# Patient Record
Sex: Female | Born: 1935 | Race: White | Hispanic: No | Smoking: Former smoker
Health system: Southern US, Community
[De-identification: ages and names within clinical notes are randomized; demographics above are authoritative.]

## PROBLEM LIST (undated history)

## (undated) DIAGNOSIS — I5022 Chronic systolic (congestive) heart failure: Secondary | ICD-10-CM

## (undated) DIAGNOSIS — I2510 Atherosclerotic heart disease of native coronary artery without angina pectoris: Secondary | ICD-10-CM

## (undated) DIAGNOSIS — I739 Peripheral vascular disease, unspecified: Secondary | ICD-10-CM

## (undated) DIAGNOSIS — C50919 Malignant neoplasm of unspecified site of unspecified female breast: Secondary | ICD-10-CM

## (undated) DIAGNOSIS — R0600 Dyspnea, unspecified: Secondary | ICD-10-CM

## (undated) DIAGNOSIS — R7989 Other specified abnormal findings of blood chemistry: Secondary | ICD-10-CM

## (undated) DIAGNOSIS — E785 Hyperlipidemia, unspecified: Secondary | ICD-10-CM

## (undated) DIAGNOSIS — M810 Age-related osteoporosis without current pathological fracture: Secondary | ICD-10-CM

## (undated) DIAGNOSIS — I10 Essential (primary) hypertension: Secondary | ICD-10-CM

## (undated) DIAGNOSIS — R778 Other specified abnormalities of plasma proteins: Secondary | ICD-10-CM

## (undated) DIAGNOSIS — C50411 Malignant neoplasm of upper-outer quadrant of right female breast: Secondary | ICD-10-CM

## (undated) HISTORY — DX: Other specified abnormalities of plasma proteins: R77.8

## (undated) HISTORY — DX: Other specified abnormal findings of blood chemistry: R79.89

## (undated) HISTORY — DX: Dyspnea, unspecified: R06.00

## (undated) HISTORY — DX: Age-related osteoporosis without current pathological fracture: M81.0

## (undated) HISTORY — PX: BREAST LUMPECTOMY: SHX2

## (undated) HISTORY — DX: Malignant neoplasm of upper-outer quadrant of right female breast: C50.411

---

## 2006-02-02 DIAGNOSIS — Z951 Presence of aortocoronary bypass graft: Secondary | ICD-10-CM

## 2006-02-02 DIAGNOSIS — Z9889 Other specified postprocedural states: Secondary | ICD-10-CM

## 2006-02-02 HISTORY — DX: Presence of aortocoronary bypass graft: Z95.1

## 2006-02-02 HISTORY — DX: Other specified postprocedural states: Z98.890

## 2006-09-17 ENCOUNTER — Ambulatory Visit: Payer: Self-pay | Admitting: Cardiothoracic Surgery

## 2011-03-02 DIAGNOSIS — J019 Acute sinusitis, unspecified: Secondary | ICD-10-CM | POA: Diagnosis not present

## 2011-03-02 DIAGNOSIS — J209 Acute bronchitis, unspecified: Secondary | ICD-10-CM | POA: Diagnosis not present

## 2011-03-02 DIAGNOSIS — Z6826 Body mass index (BMI) 26.0-26.9, adult: Secondary | ICD-10-CM | POA: Diagnosis not present

## 2011-03-16 DIAGNOSIS — H353190 Nonexudative age-related macular degeneration, unspecified eye, stage unspecified: Secondary | ICD-10-CM | POA: Diagnosis not present

## 2011-03-16 DIAGNOSIS — H524 Presbyopia: Secondary | ICD-10-CM | POA: Diagnosis not present

## 2011-03-16 DIAGNOSIS — H26499 Other secondary cataract, unspecified eye: Secondary | ICD-10-CM | POA: Diagnosis not present

## 2011-05-27 DIAGNOSIS — E785 Hyperlipidemia, unspecified: Secondary | ICD-10-CM | POA: Diagnosis not present

## 2011-05-27 DIAGNOSIS — Z79899 Other long term (current) drug therapy: Secondary | ICD-10-CM | POA: Diagnosis not present

## 2011-05-27 DIAGNOSIS — Z6826 Body mass index (BMI) 26.0-26.9, adult: Secondary | ICD-10-CM | POA: Diagnosis not present

## 2011-05-27 DIAGNOSIS — K625 Hemorrhage of anus and rectum: Secondary | ICD-10-CM | POA: Diagnosis not present

## 2011-05-27 DIAGNOSIS — I4891 Unspecified atrial fibrillation: Secondary | ICD-10-CM | POA: Diagnosis not present

## 2011-05-27 DIAGNOSIS — I10 Essential (primary) hypertension: Secondary | ICD-10-CM | POA: Diagnosis not present

## 2011-05-27 DIAGNOSIS — R1013 Epigastric pain: Secondary | ICD-10-CM | POA: Diagnosis not present

## 2011-06-16 DIAGNOSIS — I2510 Atherosclerotic heart disease of native coronary artery without angina pectoris: Secondary | ICD-10-CM | POA: Diagnosis not present

## 2011-06-16 DIAGNOSIS — Z79899 Other long term (current) drug therapy: Secondary | ICD-10-CM | POA: Diagnosis not present

## 2011-06-16 DIAGNOSIS — I10 Essential (primary) hypertension: Secondary | ICD-10-CM | POA: Diagnosis not present

## 2011-11-23 DIAGNOSIS — E785 Hyperlipidemia, unspecified: Secondary | ICD-10-CM | POA: Diagnosis not present

## 2011-11-23 DIAGNOSIS — I10 Essential (primary) hypertension: Secondary | ICD-10-CM | POA: Diagnosis not present

## 2011-11-23 DIAGNOSIS — Z79899 Other long term (current) drug therapy: Secondary | ICD-10-CM | POA: Diagnosis not present

## 2011-11-23 DIAGNOSIS — Z23 Encounter for immunization: Secondary | ICD-10-CM | POA: Diagnosis not present

## 2011-11-23 DIAGNOSIS — K279 Peptic ulcer, site unspecified, unspecified as acute or chronic, without hemorrhage or perforation: Secondary | ICD-10-CM | POA: Diagnosis not present

## 2011-11-23 DIAGNOSIS — I4891 Unspecified atrial fibrillation: Secondary | ICD-10-CM | POA: Diagnosis not present

## 2011-12-23 DIAGNOSIS — E039 Hypothyroidism, unspecified: Secondary | ICD-10-CM | POA: Diagnosis not present

## 2012-05-24 DIAGNOSIS — Z1331 Encounter for screening for depression: Secondary | ICD-10-CM | POA: Diagnosis not present

## 2012-05-24 DIAGNOSIS — Z79899 Other long term (current) drug therapy: Secondary | ICD-10-CM | POA: Diagnosis not present

## 2012-05-24 DIAGNOSIS — I10 Essential (primary) hypertension: Secondary | ICD-10-CM | POA: Diagnosis not present

## 2012-05-24 DIAGNOSIS — Z9181 History of falling: Secondary | ICD-10-CM | POA: Diagnosis not present

## 2012-05-24 DIAGNOSIS — I4891 Unspecified atrial fibrillation: Secondary | ICD-10-CM | POA: Diagnosis not present

## 2012-05-24 DIAGNOSIS — Z6825 Body mass index (BMI) 25.0-25.9, adult: Secondary | ICD-10-CM | POA: Diagnosis not present

## 2012-05-24 DIAGNOSIS — E039 Hypothyroidism, unspecified: Secondary | ICD-10-CM | POA: Diagnosis not present

## 2012-05-24 DIAGNOSIS — E785 Hyperlipidemia, unspecified: Secondary | ICD-10-CM | POA: Diagnosis not present

## 2012-09-05 DIAGNOSIS — J209 Acute bronchitis, unspecified: Secondary | ICD-10-CM | POA: Diagnosis not present

## 2012-09-05 DIAGNOSIS — Z6826 Body mass index (BMI) 26.0-26.9, adult: Secondary | ICD-10-CM | POA: Diagnosis not present

## 2012-09-12 DIAGNOSIS — J440 Chronic obstructive pulmonary disease with acute lower respiratory infection: Secondary | ICD-10-CM | POA: Diagnosis not present

## 2012-09-12 DIAGNOSIS — Z6826 Body mass index (BMI) 26.0-26.9, adult: Secondary | ICD-10-CM | POA: Diagnosis not present

## 2012-09-21 DIAGNOSIS — J209 Acute bronchitis, unspecified: Secondary | ICD-10-CM | POA: Diagnosis not present

## 2012-09-21 DIAGNOSIS — R0609 Other forms of dyspnea: Secondary | ICD-10-CM | POA: Diagnosis not present

## 2012-11-29 DIAGNOSIS — I10 Essential (primary) hypertension: Secondary | ICD-10-CM | POA: Diagnosis not present

## 2012-11-29 DIAGNOSIS — E785 Hyperlipidemia, unspecified: Secondary | ICD-10-CM | POA: Diagnosis not present

## 2012-11-29 DIAGNOSIS — E039 Hypothyroidism, unspecified: Secondary | ICD-10-CM | POA: Diagnosis not present

## 2012-11-29 DIAGNOSIS — I4891 Unspecified atrial fibrillation: Secondary | ICD-10-CM | POA: Diagnosis not present

## 2012-11-29 DIAGNOSIS — Z79899 Other long term (current) drug therapy: Secondary | ICD-10-CM | POA: Diagnosis not present

## 2012-11-29 DIAGNOSIS — Z6826 Body mass index (BMI) 26.0-26.9, adult: Secondary | ICD-10-CM | POA: Diagnosis not present

## 2013-03-07 DIAGNOSIS — H353190 Nonexudative age-related macular degeneration, unspecified eye, stage unspecified: Secondary | ICD-10-CM | POA: Diagnosis not present

## 2013-03-07 DIAGNOSIS — Z961 Presence of intraocular lens: Secondary | ICD-10-CM | POA: Diagnosis not present

## 2013-03-07 DIAGNOSIS — E11319 Type 2 diabetes mellitus with unspecified diabetic retinopathy without macular edema: Secondary | ICD-10-CM | POA: Diagnosis not present

## 2013-03-07 DIAGNOSIS — H35379 Puckering of macula, unspecified eye: Secondary | ICD-10-CM | POA: Diagnosis not present

## 2013-05-02 DIAGNOSIS — I10 Essential (primary) hypertension: Secondary | ICD-10-CM | POA: Diagnosis not present

## 2013-05-02 DIAGNOSIS — I2510 Atherosclerotic heart disease of native coronary artery without angina pectoris: Secondary | ICD-10-CM | POA: Diagnosis not present

## 2013-05-02 DIAGNOSIS — I059 Rheumatic mitral valve disease, unspecified: Secondary | ICD-10-CM | POA: Diagnosis not present

## 2013-05-16 DIAGNOSIS — R079 Chest pain, unspecified: Secondary | ICD-10-CM | POA: Diagnosis not present

## 2013-05-16 DIAGNOSIS — I059 Rheumatic mitral valve disease, unspecified: Secondary | ICD-10-CM | POA: Diagnosis not present

## 2013-05-16 DIAGNOSIS — I2510 Atherosclerotic heart disease of native coronary artery without angina pectoris: Secondary | ICD-10-CM | POA: Diagnosis not present

## 2013-05-19 DIAGNOSIS — Z6826 Body mass index (BMI) 26.0-26.9, adult: Secondary | ICD-10-CM | POA: Diagnosis not present

## 2013-05-19 DIAGNOSIS — J440 Chronic obstructive pulmonary disease with acute lower respiratory infection: Secondary | ICD-10-CM | POA: Diagnosis not present

## 2013-05-24 DIAGNOSIS — I10 Essential (primary) hypertension: Secondary | ICD-10-CM | POA: Diagnosis not present

## 2013-05-24 DIAGNOSIS — I428 Other cardiomyopathies: Secondary | ICD-10-CM | POA: Diagnosis not present

## 2013-05-24 DIAGNOSIS — I2510 Atherosclerotic heart disease of native coronary artery without angina pectoris: Secondary | ICD-10-CM | POA: Diagnosis not present

## 2013-05-24 DIAGNOSIS — Z79899 Other long term (current) drug therapy: Secondary | ICD-10-CM | POA: Diagnosis not present

## 2013-06-06 DIAGNOSIS — Z6825 Body mass index (BMI) 25.0-25.9, adult: Secondary | ICD-10-CM | POA: Diagnosis not present

## 2013-06-06 DIAGNOSIS — I10 Essential (primary) hypertension: Secondary | ICD-10-CM | POA: Diagnosis not present

## 2013-06-06 DIAGNOSIS — Z79899 Other long term (current) drug therapy: Secondary | ICD-10-CM | POA: Diagnosis not present

## 2013-06-06 DIAGNOSIS — I4891 Unspecified atrial fibrillation: Secondary | ICD-10-CM | POA: Diagnosis not present

## 2013-06-06 DIAGNOSIS — E039 Hypothyroidism, unspecified: Secondary | ICD-10-CM | POA: Diagnosis not present

## 2013-06-06 DIAGNOSIS — E785 Hyperlipidemia, unspecified: Secondary | ICD-10-CM | POA: Diagnosis not present

## 2013-12-12 DIAGNOSIS — Z9181 History of falling: Secondary | ICD-10-CM | POA: Diagnosis not present

## 2013-12-12 DIAGNOSIS — E039 Hypothyroidism, unspecified: Secondary | ICD-10-CM | POA: Diagnosis not present

## 2013-12-12 DIAGNOSIS — Z1389 Encounter for screening for other disorder: Secondary | ICD-10-CM | POA: Diagnosis not present

## 2013-12-12 DIAGNOSIS — Z23 Encounter for immunization: Secondary | ICD-10-CM | POA: Diagnosis not present

## 2013-12-12 DIAGNOSIS — E785 Hyperlipidemia, unspecified: Secondary | ICD-10-CM | POA: Diagnosis not present

## 2013-12-12 DIAGNOSIS — I10 Essential (primary) hypertension: Secondary | ICD-10-CM | POA: Diagnosis not present

## 2013-12-12 DIAGNOSIS — Z79899 Other long term (current) drug therapy: Secondary | ICD-10-CM | POA: Diagnosis not present

## 2013-12-12 DIAGNOSIS — I4891 Unspecified atrial fibrillation: Secondary | ICD-10-CM | POA: Diagnosis not present

## 2014-01-02 DIAGNOSIS — Z23 Encounter for immunization: Secondary | ICD-10-CM | POA: Diagnosis not present

## 2014-01-09 DIAGNOSIS — Z6824 Body mass index (BMI) 24.0-24.9, adult: Secondary | ICD-10-CM | POA: Diagnosis not present

## 2014-01-09 DIAGNOSIS — J209 Acute bronchitis, unspecified: Secondary | ICD-10-CM | POA: Diagnosis not present

## 2014-01-09 DIAGNOSIS — J019 Acute sinusitis, unspecified: Secondary | ICD-10-CM | POA: Diagnosis not present

## 2014-07-03 DIAGNOSIS — I10 Essential (primary) hypertension: Secondary | ICD-10-CM | POA: Diagnosis not present

## 2014-07-03 DIAGNOSIS — Z79899 Other long term (current) drug therapy: Secondary | ICD-10-CM | POA: Diagnosis not present

## 2014-07-03 DIAGNOSIS — E785 Hyperlipidemia, unspecified: Secondary | ICD-10-CM | POA: Diagnosis not present

## 2014-07-03 DIAGNOSIS — I4891 Unspecified atrial fibrillation: Secondary | ICD-10-CM | POA: Diagnosis not present

## 2014-07-03 DIAGNOSIS — Z1389 Encounter for screening for other disorder: Secondary | ICD-10-CM | POA: Diagnosis not present

## 2014-07-03 DIAGNOSIS — Z6824 Body mass index (BMI) 24.0-24.9, adult: Secondary | ICD-10-CM | POA: Diagnosis not present

## 2014-07-03 DIAGNOSIS — E039 Hypothyroidism, unspecified: Secondary | ICD-10-CM | POA: Diagnosis not present

## 2014-10-02 DIAGNOSIS — Z6825 Body mass index (BMI) 25.0-25.9, adult: Secondary | ICD-10-CM | POA: Diagnosis not present

## 2014-10-02 DIAGNOSIS — Z9889 Other specified postprocedural states: Secondary | ICD-10-CM | POA: Diagnosis not present

## 2014-10-02 DIAGNOSIS — I429 Cardiomyopathy, unspecified: Secondary | ICD-10-CM | POA: Diagnosis not present

## 2014-10-02 DIAGNOSIS — I2510 Atherosclerotic heart disease of native coronary artery without angina pectoris: Secondary | ICD-10-CM

## 2014-10-02 DIAGNOSIS — I10 Essential (primary) hypertension: Secondary | ICD-10-CM | POA: Diagnosis not present

## 2014-10-02 HISTORY — DX: Essential (primary) hypertension: I10

## 2014-10-02 HISTORY — DX: Atherosclerotic heart disease of native coronary artery without angina pectoris: I25.10

## 2014-10-02 HISTORY — DX: Other specified postprocedural states: Z98.890

## 2014-10-24 DIAGNOSIS — I340 Nonrheumatic mitral (valve) insufficiency: Secondary | ICD-10-CM | POA: Diagnosis not present

## 2014-10-24 DIAGNOSIS — I517 Cardiomegaly: Secondary | ICD-10-CM | POA: Diagnosis not present

## 2014-11-28 DIAGNOSIS — M545 Low back pain: Secondary | ICD-10-CM | POA: Diagnosis not present

## 2014-11-28 DIAGNOSIS — I739 Peripheral vascular disease, unspecified: Secondary | ICD-10-CM | POA: Diagnosis not present

## 2014-11-28 DIAGNOSIS — I70213 Atherosclerosis of native arteries of extremities with intermittent claudication, bilateral legs: Secondary | ICD-10-CM | POA: Diagnosis not present

## 2014-12-06 DIAGNOSIS — I70213 Atherosclerosis of native arteries of extremities with intermittent claudication, bilateral legs: Secondary | ICD-10-CM | POA: Diagnosis not present

## 2014-12-06 DIAGNOSIS — I700 Atherosclerosis of aorta: Secondary | ICD-10-CM | POA: Diagnosis not present

## 2014-12-06 DIAGNOSIS — I2510 Atherosclerotic heart disease of native coronary artery without angina pectoris: Secondary | ICD-10-CM | POA: Diagnosis not present

## 2014-12-06 DIAGNOSIS — I739 Peripheral vascular disease, unspecified: Secondary | ICD-10-CM | POA: Diagnosis not present

## 2014-12-06 DIAGNOSIS — I429 Cardiomyopathy, unspecified: Secondary | ICD-10-CM | POA: Diagnosis not present

## 2014-12-06 DIAGNOSIS — R0989 Other specified symptoms and signs involving the circulatory and respiratory systems: Secondary | ICD-10-CM | POA: Diagnosis not present

## 2015-01-08 DIAGNOSIS — Z79899 Other long term (current) drug therapy: Secondary | ICD-10-CM | POA: Diagnosis not present

## 2015-01-08 DIAGNOSIS — Z1389 Encounter for screening for other disorder: Secondary | ICD-10-CM | POA: Diagnosis not present

## 2015-01-08 DIAGNOSIS — E785 Hyperlipidemia, unspecified: Secondary | ICD-10-CM | POA: Diagnosis not present

## 2015-01-08 DIAGNOSIS — I4891 Unspecified atrial fibrillation: Secondary | ICD-10-CM | POA: Diagnosis not present

## 2015-01-08 DIAGNOSIS — Z23 Encounter for immunization: Secondary | ICD-10-CM | POA: Diagnosis not present

## 2015-01-08 DIAGNOSIS — Z9181 History of falling: Secondary | ICD-10-CM | POA: Diagnosis not present

## 2015-01-08 DIAGNOSIS — I10 Essential (primary) hypertension: Secondary | ICD-10-CM | POA: Diagnosis not present

## 2015-01-08 DIAGNOSIS — Z6825 Body mass index (BMI) 25.0-25.9, adult: Secondary | ICD-10-CM | POA: Diagnosis not present

## 2015-01-08 DIAGNOSIS — H00012 Hordeolum externum right lower eyelid: Secondary | ICD-10-CM | POA: Diagnosis not present

## 2015-01-08 DIAGNOSIS — E039 Hypothyroidism, unspecified: Secondary | ICD-10-CM | POA: Diagnosis not present

## 2015-07-16 DIAGNOSIS — Z6825 Body mass index (BMI) 25.0-25.9, adult: Secondary | ICD-10-CM | POA: Diagnosis not present

## 2015-07-16 DIAGNOSIS — E039 Hypothyroidism, unspecified: Secondary | ICD-10-CM | POA: Diagnosis not present

## 2015-07-16 DIAGNOSIS — E785 Hyperlipidemia, unspecified: Secondary | ICD-10-CM | POA: Diagnosis not present

## 2015-07-16 DIAGNOSIS — I10 Essential (primary) hypertension: Secondary | ICD-10-CM | POA: Diagnosis not present

## 2015-07-16 DIAGNOSIS — I4891 Unspecified atrial fibrillation: Secondary | ICD-10-CM | POA: Diagnosis not present

## 2015-07-16 DIAGNOSIS — E663 Overweight: Secondary | ICD-10-CM | POA: Diagnosis not present

## 2015-07-16 DIAGNOSIS — Z79899 Other long term (current) drug therapy: Secondary | ICD-10-CM | POA: Diagnosis not present

## 2016-01-21 DIAGNOSIS — I4891 Unspecified atrial fibrillation: Secondary | ICD-10-CM | POA: Diagnosis not present

## 2016-01-21 DIAGNOSIS — Z9181 History of falling: Secondary | ICD-10-CM | POA: Diagnosis not present

## 2016-01-21 DIAGNOSIS — Z79899 Other long term (current) drug therapy: Secondary | ICD-10-CM | POA: Diagnosis not present

## 2016-01-21 DIAGNOSIS — E785 Hyperlipidemia, unspecified: Secondary | ICD-10-CM | POA: Diagnosis not present

## 2016-01-21 DIAGNOSIS — E039 Hypothyroidism, unspecified: Secondary | ICD-10-CM | POA: Diagnosis not present

## 2016-01-21 DIAGNOSIS — Z23 Encounter for immunization: Secondary | ICD-10-CM | POA: Diagnosis not present

## 2016-01-21 DIAGNOSIS — Z6825 Body mass index (BMI) 25.0-25.9, adult: Secondary | ICD-10-CM | POA: Diagnosis not present

## 2016-01-21 DIAGNOSIS — I10 Essential (primary) hypertension: Secondary | ICD-10-CM | POA: Diagnosis not present

## 2016-01-21 DIAGNOSIS — Z1389 Encounter for screening for other disorder: Secondary | ICD-10-CM | POA: Diagnosis not present

## 2016-01-30 DIAGNOSIS — N6311 Unspecified lump in the right breast, upper outer quadrant: Secondary | ICD-10-CM | POA: Diagnosis not present

## 2016-02-03 HISTORY — PX: MITRAL VALVE REPAIR (MV)/CORONARY ARTERY BYPASS GRAFTING (CABG): SHX5983

## 2016-02-11 DIAGNOSIS — N6311 Unspecified lump in the right breast, upper outer quadrant: Secondary | ICD-10-CM | POA: Diagnosis not present

## 2016-02-17 DIAGNOSIS — C50411 Malignant neoplasm of upper-outer quadrant of right female breast: Secondary | ICD-10-CM | POA: Diagnosis not present

## 2016-02-17 DIAGNOSIS — N6311 Unspecified lump in the right breast, upper outer quadrant: Secondary | ICD-10-CM | POA: Diagnosis not present

## 2016-02-17 DIAGNOSIS — Z170 Estrogen receptor positive status [ER+]: Secondary | ICD-10-CM | POA: Diagnosis not present

## 2016-02-17 DIAGNOSIS — C50911 Malignant neoplasm of unspecified site of right female breast: Secondary | ICD-10-CM | POA: Diagnosis not present

## 2016-02-25 DIAGNOSIS — C50911 Malignant neoplasm of unspecified site of right female breast: Secondary | ICD-10-CM | POA: Diagnosis not present

## 2016-02-27 ENCOUNTER — Other Ambulatory Visit: Payer: Self-pay | Admitting: Surgery

## 2016-03-06 DIAGNOSIS — Z9889 Other specified postprocedural states: Secondary | ICD-10-CM | POA: Diagnosis not present

## 2016-03-06 DIAGNOSIS — Z951 Presence of aortocoronary bypass graft: Secondary | ICD-10-CM | POA: Diagnosis not present

## 2016-03-06 DIAGNOSIS — I2510 Atherosclerotic heart disease of native coronary artery without angina pectoris: Secondary | ICD-10-CM | POA: Diagnosis not present

## 2016-03-06 DIAGNOSIS — Z01810 Encounter for preprocedural cardiovascular examination: Secondary | ICD-10-CM | POA: Diagnosis not present

## 2016-03-06 DIAGNOSIS — I351 Nonrheumatic aortic (valve) insufficiency: Secondary | ICD-10-CM | POA: Diagnosis not present

## 2016-03-20 DIAGNOSIS — I10 Essential (primary) hypertension: Secondary | ICD-10-CM | POA: Diagnosis not present

## 2016-03-20 DIAGNOSIS — J449 Chronic obstructive pulmonary disease, unspecified: Secondary | ICD-10-CM | POA: Diagnosis not present

## 2016-03-20 DIAGNOSIS — Z01818 Encounter for other preprocedural examination: Secondary | ICD-10-CM | POA: Diagnosis not present

## 2016-03-20 DIAGNOSIS — C50911 Malignant neoplasm of unspecified site of right female breast: Secondary | ICD-10-CM | POA: Diagnosis not present

## 2016-03-23 DIAGNOSIS — R471 Dysarthria and anarthria: Secondary | ICD-10-CM | POA: Diagnosis not present

## 2016-03-23 DIAGNOSIS — I349 Nonrheumatic mitral valve disorder, unspecified: Secondary | ICD-10-CM | POA: Diagnosis not present

## 2016-03-23 DIAGNOSIS — I469 Cardiac arrest, cause unspecified: Secondary | ICD-10-CM | POA: Diagnosis not present

## 2016-03-23 DIAGNOSIS — R000 Tachycardia, unspecified: Secondary | ICD-10-CM | POA: Diagnosis not present

## 2016-03-23 DIAGNOSIS — R918 Other nonspecific abnormal finding of lung field: Secondary | ICD-10-CM | POA: Diagnosis not present

## 2016-03-23 DIAGNOSIS — R404 Transient alteration of awareness: Secondary | ICD-10-CM | POA: Diagnosis not present

## 2016-03-23 DIAGNOSIS — N390 Urinary tract infection, site not specified: Secondary | ICD-10-CM | POA: Diagnosis not present

## 2016-03-23 DIAGNOSIS — R031 Nonspecific low blood-pressure reading: Secondary | ICD-10-CM | POA: Diagnosis not present

## 2016-03-23 DIAGNOSIS — N179 Acute kidney failure, unspecified: Secondary | ICD-10-CM | POA: Diagnosis not present

## 2016-03-23 DIAGNOSIS — J811 Chronic pulmonary edema: Secondary | ICD-10-CM | POA: Diagnosis not present

## 2016-03-23 DIAGNOSIS — I739 Peripheral vascular disease, unspecified: Secondary | ICD-10-CM | POA: Diagnosis not present

## 2016-03-23 DIAGNOSIS — Z6824 Body mass index (BMI) 24.0-24.9, adult: Secondary | ICD-10-CM | POA: Diagnosis not present

## 2016-03-23 DIAGNOSIS — Z87891 Personal history of nicotine dependence: Secondary | ICD-10-CM | POA: Diagnosis not present

## 2016-03-23 DIAGNOSIS — I639 Cerebral infarction, unspecified: Secondary | ICD-10-CM | POA: Diagnosis not present

## 2016-03-23 DIAGNOSIS — E038 Other specified hypothyroidism: Secondary | ICD-10-CM | POA: Diagnosis not present

## 2016-03-23 DIAGNOSIS — L270 Generalized skin eruption due to drugs and medicaments taken internally: Secondary | ICD-10-CM | POA: Diagnosis not present

## 2016-03-23 DIAGNOSIS — C50411 Malignant neoplasm of upper-outer quadrant of right female breast: Secondary | ICD-10-CM | POA: Diagnosis not present

## 2016-03-23 DIAGNOSIS — I429 Cardiomyopathy, unspecified: Secondary | ICD-10-CM | POA: Diagnosis not present

## 2016-03-23 DIAGNOSIS — I63411 Cerebral infarction due to embolism of right middle cerebral artery: Secondary | ICD-10-CM | POA: Diagnosis not present

## 2016-03-23 DIAGNOSIS — I501 Left ventricular failure: Secondary | ICD-10-CM | POA: Diagnosis not present

## 2016-03-23 DIAGNOSIS — J9690 Respiratory failure, unspecified, unspecified whether with hypoxia or hypercapnia: Secondary | ICD-10-CM | POA: Diagnosis not present

## 2016-03-23 DIAGNOSIS — J810 Acute pulmonary edema: Secondary | ICD-10-CM | POA: Diagnosis not present

## 2016-03-23 DIAGNOSIS — T45525A Adverse effect of antithrombotic drugs, initial encounter: Secondary | ICD-10-CM | POA: Diagnosis not present

## 2016-03-23 DIAGNOSIS — D72829 Elevated white blood cell count, unspecified: Secondary | ICD-10-CM | POA: Diagnosis not present

## 2016-03-23 DIAGNOSIS — Z86711 Personal history of pulmonary embolism: Secondary | ICD-10-CM | POA: Diagnosis not present

## 2016-03-23 DIAGNOSIS — N1330 Unspecified hydronephrosis: Secondary | ICD-10-CM | POA: Diagnosis not present

## 2016-03-23 DIAGNOSIS — Z7902 Long term (current) use of antithrombotics/antiplatelets: Secondary | ICD-10-CM | POA: Diagnosis not present

## 2016-03-23 DIAGNOSIS — Z955 Presence of coronary angioplasty implant and graft: Secondary | ICD-10-CM | POA: Diagnosis not present

## 2016-03-23 DIAGNOSIS — E785 Hyperlipidemia, unspecified: Secondary | ICD-10-CM | POA: Diagnosis not present

## 2016-03-23 DIAGNOSIS — Z951 Presence of aortocoronary bypass graft: Secondary | ICD-10-CM | POA: Diagnosis not present

## 2016-03-23 DIAGNOSIS — R092 Respiratory arrest: Secondary | ICD-10-CM | POA: Diagnosis not present

## 2016-03-23 DIAGNOSIS — Z7982 Long term (current) use of aspirin: Secondary | ICD-10-CM | POA: Diagnosis not present

## 2016-03-23 DIAGNOSIS — I351 Nonrheumatic aortic (valve) insufficiency: Secondary | ICD-10-CM | POA: Diagnosis not present

## 2016-03-23 DIAGNOSIS — E876 Hypokalemia: Secondary | ICD-10-CM | POA: Diagnosis not present

## 2016-03-23 DIAGNOSIS — E039 Hypothyroidism, unspecified: Secondary | ICD-10-CM | POA: Diagnosis not present

## 2016-03-23 DIAGNOSIS — R05 Cough: Secondary | ICD-10-CM | POA: Diagnosis not present

## 2016-03-23 DIAGNOSIS — I5040 Unspecified combined systolic (congestive) and diastolic (congestive) heart failure: Secondary | ICD-10-CM | POA: Diagnosis not present

## 2016-03-23 DIAGNOSIS — I63311 Cerebral infarction due to thrombosis of right middle cerebral artery: Secondary | ICD-10-CM | POA: Diagnosis not present

## 2016-03-23 DIAGNOSIS — R1310 Dysphagia, unspecified: Secondary | ICD-10-CM | POA: Diagnosis not present

## 2016-03-23 DIAGNOSIS — I252 Old myocardial infarction: Secondary | ICD-10-CM | POA: Diagnosis not present

## 2016-03-23 DIAGNOSIS — N6310 Unspecified lump in the right breast, unspecified quadrant: Secondary | ICD-10-CM | POA: Diagnosis not present

## 2016-03-23 DIAGNOSIS — I63231 Cerebral infarction due to unspecified occlusion or stenosis of right carotid arteries: Secondary | ICD-10-CM | POA: Diagnosis not present

## 2016-03-23 DIAGNOSIS — I6523 Occlusion and stenosis of bilateral carotid arteries: Secondary | ICD-10-CM | POA: Diagnosis not present

## 2016-03-23 DIAGNOSIS — I10 Essential (primary) hypertension: Secondary | ICD-10-CM | POA: Diagnosis not present

## 2016-03-23 DIAGNOSIS — R9431 Abnormal electrocardiogram [ECG] [EKG]: Secondary | ICD-10-CM | POA: Diagnosis not present

## 2016-03-23 DIAGNOSIS — Z4682 Encounter for fitting and adjustment of non-vascular catheter: Secondary | ICD-10-CM | POA: Diagnosis not present

## 2016-03-23 DIAGNOSIS — I25708 Atherosclerosis of coronary artery bypass graft(s), unspecified, with other forms of angina pectoris: Secondary | ICD-10-CM | POA: Diagnosis not present

## 2016-03-23 DIAGNOSIS — I119 Hypertensive heart disease without heart failure: Secondary | ICD-10-CM | POA: Diagnosis not present

## 2016-03-23 DIAGNOSIS — I6503 Occlusion and stenosis of bilateral vertebral arteries: Secondary | ICD-10-CM | POA: Diagnosis not present

## 2016-03-23 DIAGNOSIS — I5189 Other ill-defined heart diseases: Secondary | ICD-10-CM | POA: Diagnosis not present

## 2016-03-23 DIAGNOSIS — I2510 Atherosclerotic heart disease of native coronary artery without angina pectoris: Secondary | ICD-10-CM | POA: Diagnosis not present

## 2016-03-23 DIAGNOSIS — R21 Rash and other nonspecific skin eruption: Secondary | ICD-10-CM | POA: Diagnosis not present

## 2016-03-23 DIAGNOSIS — I2119 ST elevation (STEMI) myocardial infarction involving other coronary artery of inferior wall: Secondary | ICD-10-CM | POA: Diagnosis not present

## 2016-03-23 DIAGNOSIS — Z01810 Encounter for preprocedural cardiovascular examination: Secondary | ICD-10-CM | POA: Diagnosis not present

## 2016-03-23 DIAGNOSIS — M810 Age-related osteoporosis without current pathological fracture: Secondary | ICD-10-CM | POA: Diagnosis not present

## 2016-03-23 DIAGNOSIS — I25118 Atherosclerotic heart disease of native coronary artery with other forms of angina pectoris: Secondary | ICD-10-CM | POA: Diagnosis not present

## 2016-03-23 DIAGNOSIS — I110 Hypertensive heart disease with heart failure: Secondary | ICD-10-CM | POA: Diagnosis not present

## 2016-03-23 DIAGNOSIS — I25810 Atherosclerosis of coronary artery bypass graft(s) without angina pectoris: Secondary | ICD-10-CM | POA: Diagnosis not present

## 2016-03-23 DIAGNOSIS — I6521 Occlusion and stenosis of right carotid artery: Secondary | ICD-10-CM | POA: Diagnosis not present

## 2016-03-23 DIAGNOSIS — R319 Hematuria, unspecified: Secondary | ICD-10-CM | POA: Diagnosis not present

## 2016-03-23 DIAGNOSIS — I2111 ST elevation (STEMI) myocardial infarction involving right coronary artery: Secondary | ICD-10-CM | POA: Diagnosis not present

## 2016-03-25 DIAGNOSIS — I63411 Cerebral infarction due to embolism of right middle cerebral artery: Secondary | ICD-10-CM

## 2016-03-25 HISTORY — DX: Cerebral infarction due to embolism of right middle cerebral artery: I63.411

## 2016-03-28 DIAGNOSIS — L270 Generalized skin eruption due to drugs and medicaments taken internally: Secondary | ICD-10-CM | POA: Insufficient documentation

## 2016-03-28 HISTORY — DX: Generalized skin eruption due to drugs and medicaments taken internally: L27.0

## 2016-04-02 DIAGNOSIS — L270 Generalized skin eruption due to drugs and medicaments taken internally: Secondary | ICD-10-CM | POA: Diagnosis not present

## 2016-04-02 DIAGNOSIS — N390 Urinary tract infection, site not specified: Secondary | ICD-10-CM | POA: Diagnosis not present

## 2016-04-02 DIAGNOSIS — I6529 Occlusion and stenosis of unspecified carotid artery: Secondary | ICD-10-CM | POA: Diagnosis not present

## 2016-04-02 DIAGNOSIS — I700 Atherosclerosis of aorta: Secondary | ICD-10-CM | POA: Diagnosis not present

## 2016-04-02 DIAGNOSIS — Z87891 Personal history of nicotine dependence: Secondary | ICD-10-CM | POA: Diagnosis not present

## 2016-04-02 DIAGNOSIS — I739 Peripheral vascular disease, unspecified: Secondary | ICD-10-CM | POA: Diagnosis not present

## 2016-04-02 DIAGNOSIS — I252 Old myocardial infarction: Secondary | ICD-10-CM | POA: Diagnosis not present

## 2016-04-02 DIAGNOSIS — I10 Essential (primary) hypertension: Secondary | ICD-10-CM | POA: Diagnosis not present

## 2016-04-02 DIAGNOSIS — I2510 Atherosclerotic heart disease of native coronary artery without angina pectoris: Secondary | ICD-10-CM | POA: Diagnosis not present

## 2016-04-02 DIAGNOSIS — Z951 Presence of aortocoronary bypass graft: Secondary | ICD-10-CM | POA: Diagnosis not present

## 2016-04-02 DIAGNOSIS — I2111 ST elevation (STEMI) myocardial infarction involving right coronary artery: Secondary | ICD-10-CM | POA: Diagnosis not present

## 2016-04-02 DIAGNOSIS — Z7982 Long term (current) use of aspirin: Secondary | ICD-10-CM | POA: Diagnosis not present

## 2016-04-02 DIAGNOSIS — I69322 Dysarthria following cerebral infarction: Secondary | ICD-10-CM | POA: Diagnosis not present

## 2016-04-02 DIAGNOSIS — I517 Cardiomegaly: Secondary | ICD-10-CM | POA: Diagnosis not present

## 2016-04-02 DIAGNOSIS — M810 Age-related osteoporosis without current pathological fracture: Secondary | ICD-10-CM | POA: Diagnosis not present

## 2016-04-02 DIAGNOSIS — D72829 Elevated white blood cell count, unspecified: Secondary | ICD-10-CM | POA: Diagnosis not present

## 2016-04-02 DIAGNOSIS — Z6823 Body mass index (BMI) 23.0-23.9, adult: Secondary | ICD-10-CM | POA: Diagnosis not present

## 2016-04-02 DIAGNOSIS — I63411 Cerebral infarction due to embolism of right middle cerebral artery: Secondary | ICD-10-CM | POA: Diagnosis not present

## 2016-04-02 DIAGNOSIS — R319 Hematuria, unspecified: Secondary | ICD-10-CM | POA: Diagnosis not present

## 2016-04-02 DIAGNOSIS — J810 Acute pulmonary edema: Secondary | ICD-10-CM | POA: Diagnosis not present

## 2016-04-02 DIAGNOSIS — K623 Rectal prolapse: Secondary | ICD-10-CM | POA: Diagnosis not present

## 2016-04-02 DIAGNOSIS — R1310 Dysphagia, unspecified: Secondary | ICD-10-CM | POA: Diagnosis not present

## 2016-04-02 DIAGNOSIS — I69391 Dysphagia following cerebral infarction: Secondary | ICD-10-CM

## 2016-04-02 DIAGNOSIS — I25118 Atherosclerotic heart disease of native coronary artery with other forms of angina pectoris: Secondary | ICD-10-CM | POA: Diagnosis not present

## 2016-04-02 DIAGNOSIS — I429 Cardiomyopathy, unspecified: Secondary | ICD-10-CM | POA: Diagnosis not present

## 2016-04-02 DIAGNOSIS — I639 Cerebral infarction, unspecified: Secondary | ICD-10-CM | POA: Diagnosis not present

## 2016-04-02 DIAGNOSIS — I6523 Occlusion and stenosis of bilateral carotid arteries: Secondary | ICD-10-CM | POA: Diagnosis not present

## 2016-04-02 DIAGNOSIS — G510 Bell's palsy: Secondary | ICD-10-CM | POA: Diagnosis not present

## 2016-04-02 DIAGNOSIS — Z6824 Body mass index (BMI) 24.0-24.9, adult: Secondary | ICD-10-CM | POA: Diagnosis not present

## 2016-04-02 DIAGNOSIS — Z86711 Personal history of pulmonary embolism: Secondary | ICD-10-CM | POA: Diagnosis not present

## 2016-04-02 HISTORY — DX: Dysphagia following cerebral infarction: I69.391

## 2016-04-04 DIAGNOSIS — I517 Cardiomegaly: Secondary | ICD-10-CM | POA: Diagnosis not present

## 2016-04-04 DIAGNOSIS — I700 Atherosclerosis of aorta: Secondary | ICD-10-CM | POA: Diagnosis not present

## 2016-04-15 DIAGNOSIS — I69391 Dysphagia following cerebral infarction: Secondary | ICD-10-CM | POA: Diagnosis not present

## 2016-04-15 DIAGNOSIS — I2111 ST elevation (STEMI) myocardial infarction involving right coronary artery: Secondary | ICD-10-CM | POA: Diagnosis not present

## 2016-04-15 DIAGNOSIS — I2119 ST elevation (STEMI) myocardial infarction involving other coronary artery of inferior wall: Secondary | ICD-10-CM | POA: Diagnosis not present

## 2016-04-15 DIAGNOSIS — Z7982 Long term (current) use of aspirin: Secondary | ICD-10-CM | POA: Diagnosis not present

## 2016-04-15 DIAGNOSIS — I2510 Atherosclerotic heart disease of native coronary artery without angina pectoris: Secondary | ICD-10-CM | POA: Diagnosis not present

## 2016-04-15 DIAGNOSIS — R1312 Dysphagia, oropharyngeal phase: Secondary | ICD-10-CM | POA: Diagnosis not present

## 2016-04-15 DIAGNOSIS — Z79891 Long term (current) use of opiate analgesic: Secondary | ICD-10-CM | POA: Diagnosis not present

## 2016-04-15 DIAGNOSIS — I69392 Facial weakness following cerebral infarction: Secondary | ICD-10-CM | POA: Diagnosis not present

## 2016-04-15 DIAGNOSIS — I10 Essential (primary) hypertension: Secondary | ICD-10-CM | POA: Diagnosis not present

## 2016-04-15 DIAGNOSIS — I69322 Dysarthria following cerebral infarction: Secondary | ICD-10-CM | POA: Diagnosis not present

## 2016-04-17 DIAGNOSIS — I10 Essential (primary) hypertension: Secondary | ICD-10-CM | POA: Diagnosis not present

## 2016-04-17 DIAGNOSIS — Z79891 Long term (current) use of opiate analgesic: Secondary | ICD-10-CM | POA: Diagnosis not present

## 2016-04-17 DIAGNOSIS — I69392 Facial weakness following cerebral infarction: Secondary | ICD-10-CM | POA: Diagnosis not present

## 2016-04-17 DIAGNOSIS — I2119 ST elevation (STEMI) myocardial infarction involving other coronary artery of inferior wall: Secondary | ICD-10-CM | POA: Diagnosis not present

## 2016-04-17 DIAGNOSIS — I69391 Dysphagia following cerebral infarction: Secondary | ICD-10-CM | POA: Diagnosis not present

## 2016-04-17 DIAGNOSIS — Z7982 Long term (current) use of aspirin: Secondary | ICD-10-CM | POA: Diagnosis not present

## 2016-04-17 DIAGNOSIS — I2111 ST elevation (STEMI) myocardial infarction involving right coronary artery: Secondary | ICD-10-CM | POA: Diagnosis not present

## 2016-04-17 DIAGNOSIS — R1312 Dysphagia, oropharyngeal phase: Secondary | ICD-10-CM | POA: Diagnosis not present

## 2016-04-17 DIAGNOSIS — I2510 Atherosclerotic heart disease of native coronary artery without angina pectoris: Secondary | ICD-10-CM | POA: Diagnosis not present

## 2016-04-17 DIAGNOSIS — I69322 Dysarthria following cerebral infarction: Secondary | ICD-10-CM | POA: Diagnosis not present

## 2016-04-20 DIAGNOSIS — I2510 Atherosclerotic heart disease of native coronary artery without angina pectoris: Secondary | ICD-10-CM | POA: Diagnosis not present

## 2016-04-20 DIAGNOSIS — I69322 Dysarthria following cerebral infarction: Secondary | ICD-10-CM | POA: Diagnosis not present

## 2016-04-20 DIAGNOSIS — I2119 ST elevation (STEMI) myocardial infarction involving other coronary artery of inferior wall: Secondary | ICD-10-CM | POA: Diagnosis not present

## 2016-04-20 DIAGNOSIS — I10 Essential (primary) hypertension: Secondary | ICD-10-CM | POA: Diagnosis not present

## 2016-04-20 DIAGNOSIS — I255 Ischemic cardiomyopathy: Secondary | ICD-10-CM | POA: Diagnosis not present

## 2016-04-20 DIAGNOSIS — I6523 Occlusion and stenosis of bilateral carotid arteries: Secondary | ICD-10-CM | POA: Diagnosis not present

## 2016-04-20 DIAGNOSIS — I63411 Cerebral infarction due to embolism of right middle cerebral artery: Secondary | ICD-10-CM | POA: Diagnosis not present

## 2016-04-20 DIAGNOSIS — I63511 Cerebral infarction due to unspecified occlusion or stenosis of right middle cerebral artery: Secondary | ICD-10-CM | POA: Diagnosis not present

## 2016-04-20 DIAGNOSIS — E785 Hyperlipidemia, unspecified: Secondary | ICD-10-CM | POA: Diagnosis not present

## 2016-04-21 DIAGNOSIS — I2119 ST elevation (STEMI) myocardial infarction involving other coronary artery of inferior wall: Secondary | ICD-10-CM | POA: Diagnosis not present

## 2016-04-21 DIAGNOSIS — Z79891 Long term (current) use of opiate analgesic: Secondary | ICD-10-CM | POA: Diagnosis not present

## 2016-04-21 DIAGNOSIS — I69392 Facial weakness following cerebral infarction: Secondary | ICD-10-CM | POA: Diagnosis not present

## 2016-04-21 DIAGNOSIS — I2111 ST elevation (STEMI) myocardial infarction involving right coronary artery: Secondary | ICD-10-CM | POA: Diagnosis not present

## 2016-04-21 DIAGNOSIS — R1312 Dysphagia, oropharyngeal phase: Secondary | ICD-10-CM | POA: Diagnosis not present

## 2016-04-21 DIAGNOSIS — I10 Essential (primary) hypertension: Secondary | ICD-10-CM | POA: Diagnosis not present

## 2016-04-21 DIAGNOSIS — I69391 Dysphagia following cerebral infarction: Secondary | ICD-10-CM | POA: Diagnosis not present

## 2016-04-21 DIAGNOSIS — I2510 Atherosclerotic heart disease of native coronary artery without angina pectoris: Secondary | ICD-10-CM | POA: Diagnosis not present

## 2016-04-21 DIAGNOSIS — I69322 Dysarthria following cerebral infarction: Secondary | ICD-10-CM | POA: Diagnosis not present

## 2016-04-21 DIAGNOSIS — Z7982 Long term (current) use of aspirin: Secondary | ICD-10-CM | POA: Diagnosis not present

## 2016-04-22 DIAGNOSIS — I69392 Facial weakness following cerebral infarction: Secondary | ICD-10-CM | POA: Diagnosis not present

## 2016-04-22 DIAGNOSIS — Z79891 Long term (current) use of opiate analgesic: Secondary | ICD-10-CM | POA: Diagnosis not present

## 2016-04-22 DIAGNOSIS — I69391 Dysphagia following cerebral infarction: Secondary | ICD-10-CM | POA: Diagnosis not present

## 2016-04-22 DIAGNOSIS — I2510 Atherosclerotic heart disease of native coronary artery without angina pectoris: Secondary | ICD-10-CM | POA: Diagnosis not present

## 2016-04-22 DIAGNOSIS — I10 Essential (primary) hypertension: Secondary | ICD-10-CM | POA: Diagnosis not present

## 2016-04-22 DIAGNOSIS — Z7982 Long term (current) use of aspirin: Secondary | ICD-10-CM | POA: Diagnosis not present

## 2016-04-22 DIAGNOSIS — R1312 Dysphagia, oropharyngeal phase: Secondary | ICD-10-CM | POA: Diagnosis not present

## 2016-04-22 DIAGNOSIS — I69322 Dysarthria following cerebral infarction: Secondary | ICD-10-CM | POA: Diagnosis not present

## 2016-04-22 DIAGNOSIS — I2119 ST elevation (STEMI) myocardial infarction involving other coronary artery of inferior wall: Secondary | ICD-10-CM | POA: Diagnosis not present

## 2016-04-22 DIAGNOSIS — I2111 ST elevation (STEMI) myocardial infarction involving right coronary artery: Secondary | ICD-10-CM | POA: Diagnosis not present

## 2016-04-23 DIAGNOSIS — Z79891 Long term (current) use of opiate analgesic: Secondary | ICD-10-CM | POA: Diagnosis not present

## 2016-04-23 DIAGNOSIS — I69392 Facial weakness following cerebral infarction: Secondary | ICD-10-CM | POA: Diagnosis not present

## 2016-04-23 DIAGNOSIS — I2510 Atherosclerotic heart disease of native coronary artery without angina pectoris: Secondary | ICD-10-CM | POA: Diagnosis not present

## 2016-04-23 DIAGNOSIS — I69391 Dysphagia following cerebral infarction: Secondary | ICD-10-CM | POA: Diagnosis not present

## 2016-04-23 DIAGNOSIS — I69322 Dysarthria following cerebral infarction: Secondary | ICD-10-CM | POA: Diagnosis not present

## 2016-04-23 DIAGNOSIS — R1312 Dysphagia, oropharyngeal phase: Secondary | ICD-10-CM | POA: Diagnosis not present

## 2016-04-23 DIAGNOSIS — Z7982 Long term (current) use of aspirin: Secondary | ICD-10-CM | POA: Diagnosis not present

## 2016-04-23 DIAGNOSIS — I10 Essential (primary) hypertension: Secondary | ICD-10-CM | POA: Diagnosis not present

## 2016-04-23 DIAGNOSIS — I2119 ST elevation (STEMI) myocardial infarction involving other coronary artery of inferior wall: Secondary | ICD-10-CM | POA: Diagnosis not present

## 2016-04-23 DIAGNOSIS — I2111 ST elevation (STEMI) myocardial infarction involving right coronary artery: Secondary | ICD-10-CM | POA: Diagnosis not present

## 2016-04-24 DIAGNOSIS — Z7982 Long term (current) use of aspirin: Secondary | ICD-10-CM | POA: Diagnosis not present

## 2016-04-24 DIAGNOSIS — I69322 Dysarthria following cerebral infarction: Secondary | ICD-10-CM | POA: Diagnosis not present

## 2016-04-24 DIAGNOSIS — I2510 Atherosclerotic heart disease of native coronary artery without angina pectoris: Secondary | ICD-10-CM | POA: Diagnosis not present

## 2016-04-24 DIAGNOSIS — I10 Essential (primary) hypertension: Secondary | ICD-10-CM | POA: Diagnosis not present

## 2016-04-24 DIAGNOSIS — I2111 ST elevation (STEMI) myocardial infarction involving right coronary artery: Secondary | ICD-10-CM | POA: Diagnosis not present

## 2016-04-24 DIAGNOSIS — I69392 Facial weakness following cerebral infarction: Secondary | ICD-10-CM | POA: Diagnosis not present

## 2016-04-24 DIAGNOSIS — R1312 Dysphagia, oropharyngeal phase: Secondary | ICD-10-CM | POA: Diagnosis not present

## 2016-04-24 DIAGNOSIS — Z79891 Long term (current) use of opiate analgesic: Secondary | ICD-10-CM | POA: Diagnosis not present

## 2016-04-24 DIAGNOSIS — I69391 Dysphagia following cerebral infarction: Secondary | ICD-10-CM | POA: Diagnosis not present

## 2016-04-24 DIAGNOSIS — I2119 ST elevation (STEMI) myocardial infarction involving other coronary artery of inferior wall: Secondary | ICD-10-CM | POA: Diagnosis not present

## 2016-04-27 DIAGNOSIS — I2119 ST elevation (STEMI) myocardial infarction involving other coronary artery of inferior wall: Secondary | ICD-10-CM | POA: Diagnosis not present

## 2016-04-27 DIAGNOSIS — R1312 Dysphagia, oropharyngeal phase: Secondary | ICD-10-CM | POA: Diagnosis not present

## 2016-04-27 DIAGNOSIS — I10 Essential (primary) hypertension: Secondary | ICD-10-CM | POA: Diagnosis not present

## 2016-04-27 DIAGNOSIS — I2510 Atherosclerotic heart disease of native coronary artery without angina pectoris: Secondary | ICD-10-CM | POA: Diagnosis not present

## 2016-04-27 DIAGNOSIS — I2111 ST elevation (STEMI) myocardial infarction involving right coronary artery: Secondary | ICD-10-CM | POA: Diagnosis not present

## 2016-04-27 DIAGNOSIS — Z79891 Long term (current) use of opiate analgesic: Secondary | ICD-10-CM | POA: Diagnosis not present

## 2016-04-27 DIAGNOSIS — Z7982 Long term (current) use of aspirin: Secondary | ICD-10-CM | POA: Diagnosis not present

## 2016-04-27 DIAGNOSIS — I69322 Dysarthria following cerebral infarction: Secondary | ICD-10-CM | POA: Diagnosis not present

## 2016-04-27 DIAGNOSIS — I69392 Facial weakness following cerebral infarction: Secondary | ICD-10-CM | POA: Diagnosis not present

## 2016-04-27 DIAGNOSIS — I69391 Dysphagia following cerebral infarction: Secondary | ICD-10-CM | POA: Diagnosis not present

## 2016-04-28 DIAGNOSIS — I10 Essential (primary) hypertension: Secondary | ICD-10-CM | POA: Diagnosis not present

## 2016-04-28 DIAGNOSIS — I69391 Dysphagia following cerebral infarction: Secondary | ICD-10-CM | POA: Diagnosis not present

## 2016-04-28 DIAGNOSIS — I69322 Dysarthria following cerebral infarction: Secondary | ICD-10-CM | POA: Diagnosis not present

## 2016-04-28 DIAGNOSIS — Z7982 Long term (current) use of aspirin: Secondary | ICD-10-CM | POA: Diagnosis not present

## 2016-04-28 DIAGNOSIS — I2111 ST elevation (STEMI) myocardial infarction involving right coronary artery: Secondary | ICD-10-CM | POA: Diagnosis not present

## 2016-04-28 DIAGNOSIS — R1312 Dysphagia, oropharyngeal phase: Secondary | ICD-10-CM | POA: Diagnosis not present

## 2016-04-28 DIAGNOSIS — I2510 Atherosclerotic heart disease of native coronary artery without angina pectoris: Secondary | ICD-10-CM | POA: Diagnosis not present

## 2016-04-28 DIAGNOSIS — Z79891 Long term (current) use of opiate analgesic: Secondary | ICD-10-CM | POA: Diagnosis not present

## 2016-04-28 DIAGNOSIS — I2119 ST elevation (STEMI) myocardial infarction involving other coronary artery of inferior wall: Secondary | ICD-10-CM | POA: Diagnosis not present

## 2016-04-28 DIAGNOSIS — I69392 Facial weakness following cerebral infarction: Secondary | ICD-10-CM | POA: Diagnosis not present

## 2016-04-30 DIAGNOSIS — I2119 ST elevation (STEMI) myocardial infarction involving other coronary artery of inferior wall: Secondary | ICD-10-CM | POA: Diagnosis not present

## 2016-04-30 DIAGNOSIS — R1312 Dysphagia, oropharyngeal phase: Secondary | ICD-10-CM | POA: Diagnosis not present

## 2016-04-30 DIAGNOSIS — I10 Essential (primary) hypertension: Secondary | ICD-10-CM | POA: Diagnosis not present

## 2016-04-30 DIAGNOSIS — I2510 Atherosclerotic heart disease of native coronary artery without angina pectoris: Secondary | ICD-10-CM | POA: Diagnosis not present

## 2016-04-30 DIAGNOSIS — Z79891 Long term (current) use of opiate analgesic: Secondary | ICD-10-CM | POA: Diagnosis not present

## 2016-04-30 DIAGNOSIS — Z7982 Long term (current) use of aspirin: Secondary | ICD-10-CM | POA: Diagnosis not present

## 2016-04-30 DIAGNOSIS — I69392 Facial weakness following cerebral infarction: Secondary | ICD-10-CM | POA: Diagnosis not present

## 2016-04-30 DIAGNOSIS — I69322 Dysarthria following cerebral infarction: Secondary | ICD-10-CM | POA: Diagnosis not present

## 2016-04-30 DIAGNOSIS — I69391 Dysphagia following cerebral infarction: Secondary | ICD-10-CM | POA: Diagnosis not present

## 2016-04-30 DIAGNOSIS — I2111 ST elevation (STEMI) myocardial infarction involving right coronary artery: Secondary | ICD-10-CM | POA: Diagnosis not present

## 2016-05-05 DIAGNOSIS — I2111 ST elevation (STEMI) myocardial infarction involving right coronary artery: Secondary | ICD-10-CM | POA: Diagnosis not present

## 2016-05-05 DIAGNOSIS — R1312 Dysphagia, oropharyngeal phase: Secondary | ICD-10-CM | POA: Diagnosis not present

## 2016-05-05 DIAGNOSIS — I69322 Dysarthria following cerebral infarction: Secondary | ICD-10-CM | POA: Diagnosis not present

## 2016-05-05 DIAGNOSIS — I69392 Facial weakness following cerebral infarction: Secondary | ICD-10-CM | POA: Diagnosis not present

## 2016-05-05 DIAGNOSIS — I2119 ST elevation (STEMI) myocardial infarction involving other coronary artery of inferior wall: Secondary | ICD-10-CM | POA: Diagnosis not present

## 2016-05-05 DIAGNOSIS — Z79891 Long term (current) use of opiate analgesic: Secondary | ICD-10-CM | POA: Diagnosis not present

## 2016-05-05 DIAGNOSIS — I69391 Dysphagia following cerebral infarction: Secondary | ICD-10-CM | POA: Diagnosis not present

## 2016-05-05 DIAGNOSIS — I10 Essential (primary) hypertension: Secondary | ICD-10-CM | POA: Diagnosis not present

## 2016-05-05 DIAGNOSIS — Z7982 Long term (current) use of aspirin: Secondary | ICD-10-CM | POA: Diagnosis not present

## 2016-05-05 DIAGNOSIS — I2510 Atherosclerotic heart disease of native coronary artery without angina pectoris: Secondary | ICD-10-CM | POA: Diagnosis not present

## 2016-05-07 DIAGNOSIS — Z7982 Long term (current) use of aspirin: Secondary | ICD-10-CM | POA: Diagnosis not present

## 2016-05-07 DIAGNOSIS — I2119 ST elevation (STEMI) myocardial infarction involving other coronary artery of inferior wall: Secondary | ICD-10-CM | POA: Diagnosis not present

## 2016-05-07 DIAGNOSIS — I2510 Atherosclerotic heart disease of native coronary artery without angina pectoris: Secondary | ICD-10-CM | POA: Diagnosis not present

## 2016-05-07 DIAGNOSIS — I10 Essential (primary) hypertension: Secondary | ICD-10-CM | POA: Diagnosis not present

## 2016-05-07 DIAGNOSIS — I69391 Dysphagia following cerebral infarction: Secondary | ICD-10-CM | POA: Diagnosis not present

## 2016-05-07 DIAGNOSIS — I69322 Dysarthria following cerebral infarction: Secondary | ICD-10-CM | POA: Diagnosis not present

## 2016-05-07 DIAGNOSIS — I69392 Facial weakness following cerebral infarction: Secondary | ICD-10-CM | POA: Diagnosis not present

## 2016-05-07 DIAGNOSIS — Z79891 Long term (current) use of opiate analgesic: Secondary | ICD-10-CM | POA: Diagnosis not present

## 2016-05-07 DIAGNOSIS — I2111 ST elevation (STEMI) myocardial infarction involving right coronary artery: Secondary | ICD-10-CM | POA: Diagnosis not present

## 2016-05-07 DIAGNOSIS — R1312 Dysphagia, oropharyngeal phase: Secondary | ICD-10-CM | POA: Diagnosis not present

## 2016-05-08 DIAGNOSIS — I10 Essential (primary) hypertension: Secondary | ICD-10-CM | POA: Diagnosis not present

## 2016-05-08 DIAGNOSIS — Z7982 Long term (current) use of aspirin: Secondary | ICD-10-CM | POA: Diagnosis not present

## 2016-05-08 DIAGNOSIS — I2119 ST elevation (STEMI) myocardial infarction involving other coronary artery of inferior wall: Secondary | ICD-10-CM | POA: Diagnosis not present

## 2016-05-08 DIAGNOSIS — I69392 Facial weakness following cerebral infarction: Secondary | ICD-10-CM | POA: Diagnosis not present

## 2016-05-08 DIAGNOSIS — R1312 Dysphagia, oropharyngeal phase: Secondary | ICD-10-CM | POA: Diagnosis not present

## 2016-05-08 DIAGNOSIS — I2111 ST elevation (STEMI) myocardial infarction involving right coronary artery: Secondary | ICD-10-CM | POA: Diagnosis not present

## 2016-05-08 DIAGNOSIS — Z79891 Long term (current) use of opiate analgesic: Secondary | ICD-10-CM | POA: Diagnosis not present

## 2016-05-08 DIAGNOSIS — I2510 Atherosclerotic heart disease of native coronary artery without angina pectoris: Secondary | ICD-10-CM | POA: Diagnosis not present

## 2016-05-08 DIAGNOSIS — I69322 Dysarthria following cerebral infarction: Secondary | ICD-10-CM | POA: Diagnosis not present

## 2016-05-08 DIAGNOSIS — I69391 Dysphagia following cerebral infarction: Secondary | ICD-10-CM | POA: Diagnosis not present

## 2016-05-11 DIAGNOSIS — I2119 ST elevation (STEMI) myocardial infarction involving other coronary artery of inferior wall: Secondary | ICD-10-CM | POA: Diagnosis not present

## 2016-05-11 DIAGNOSIS — I2510 Atherosclerotic heart disease of native coronary artery without angina pectoris: Secondary | ICD-10-CM | POA: Diagnosis not present

## 2016-05-11 DIAGNOSIS — I69392 Facial weakness following cerebral infarction: Secondary | ICD-10-CM | POA: Diagnosis not present

## 2016-05-11 DIAGNOSIS — Z7982 Long term (current) use of aspirin: Secondary | ICD-10-CM | POA: Diagnosis not present

## 2016-05-11 DIAGNOSIS — R1312 Dysphagia, oropharyngeal phase: Secondary | ICD-10-CM | POA: Diagnosis not present

## 2016-05-11 DIAGNOSIS — Z79891 Long term (current) use of opiate analgesic: Secondary | ICD-10-CM | POA: Diagnosis not present

## 2016-05-11 DIAGNOSIS — I69391 Dysphagia following cerebral infarction: Secondary | ICD-10-CM | POA: Diagnosis not present

## 2016-05-11 DIAGNOSIS — I69322 Dysarthria following cerebral infarction: Secondary | ICD-10-CM | POA: Diagnosis not present

## 2016-05-11 DIAGNOSIS — I10 Essential (primary) hypertension: Secondary | ICD-10-CM | POA: Diagnosis not present

## 2016-05-11 DIAGNOSIS — I2111 ST elevation (STEMI) myocardial infarction involving right coronary artery: Secondary | ICD-10-CM | POA: Diagnosis not present

## 2016-05-12 DIAGNOSIS — R1312 Dysphagia, oropharyngeal phase: Secondary | ICD-10-CM | POA: Diagnosis not present

## 2016-05-12 DIAGNOSIS — I69392 Facial weakness following cerebral infarction: Secondary | ICD-10-CM | POA: Diagnosis not present

## 2016-05-12 DIAGNOSIS — I2111 ST elevation (STEMI) myocardial infarction involving right coronary artery: Secondary | ICD-10-CM | POA: Diagnosis not present

## 2016-05-12 DIAGNOSIS — Z79891 Long term (current) use of opiate analgesic: Secondary | ICD-10-CM | POA: Diagnosis not present

## 2016-05-12 DIAGNOSIS — I69322 Dysarthria following cerebral infarction: Secondary | ICD-10-CM | POA: Diagnosis not present

## 2016-05-12 DIAGNOSIS — I69391 Dysphagia following cerebral infarction: Secondary | ICD-10-CM | POA: Diagnosis not present

## 2016-05-12 DIAGNOSIS — I2510 Atherosclerotic heart disease of native coronary artery without angina pectoris: Secondary | ICD-10-CM | POA: Diagnosis not present

## 2016-05-12 DIAGNOSIS — I2119 ST elevation (STEMI) myocardial infarction involving other coronary artery of inferior wall: Secondary | ICD-10-CM | POA: Diagnosis not present

## 2016-05-12 DIAGNOSIS — I10 Essential (primary) hypertension: Secondary | ICD-10-CM | POA: Diagnosis not present

## 2016-05-12 DIAGNOSIS — Z7982 Long term (current) use of aspirin: Secondary | ICD-10-CM | POA: Diagnosis not present

## 2016-05-20 DIAGNOSIS — I69392 Facial weakness following cerebral infarction: Secondary | ICD-10-CM | POA: Diagnosis not present

## 2016-05-20 DIAGNOSIS — I2510 Atherosclerotic heart disease of native coronary artery without angina pectoris: Secondary | ICD-10-CM | POA: Diagnosis not present

## 2016-05-20 DIAGNOSIS — Z7982 Long term (current) use of aspirin: Secondary | ICD-10-CM | POA: Diagnosis not present

## 2016-05-20 DIAGNOSIS — R1312 Dysphagia, oropharyngeal phase: Secondary | ICD-10-CM | POA: Diagnosis not present

## 2016-05-20 DIAGNOSIS — Z79891 Long term (current) use of opiate analgesic: Secondary | ICD-10-CM | POA: Diagnosis not present

## 2016-05-20 DIAGNOSIS — I69391 Dysphagia following cerebral infarction: Secondary | ICD-10-CM | POA: Diagnosis not present

## 2016-05-20 DIAGNOSIS — I10 Essential (primary) hypertension: Secondary | ICD-10-CM | POA: Diagnosis not present

## 2016-05-20 DIAGNOSIS — I2111 ST elevation (STEMI) myocardial infarction involving right coronary artery: Secondary | ICD-10-CM | POA: Diagnosis not present

## 2016-05-20 DIAGNOSIS — I2119 ST elevation (STEMI) myocardial infarction involving other coronary artery of inferior wall: Secondary | ICD-10-CM | POA: Diagnosis not present

## 2016-05-20 DIAGNOSIS — I69322 Dysarthria following cerebral infarction: Secondary | ICD-10-CM | POA: Diagnosis not present

## 2016-06-11 DIAGNOSIS — Z6822 Body mass index (BMI) 22.0-22.9, adult: Secondary | ICD-10-CM | POA: Diagnosis not present

## 2016-06-11 DIAGNOSIS — I6523 Occlusion and stenosis of bilateral carotid arteries: Secondary | ICD-10-CM | POA: Diagnosis not present

## 2016-06-11 DIAGNOSIS — I2510 Atherosclerotic heart disease of native coronary artery without angina pectoris: Secondary | ICD-10-CM | POA: Diagnosis not present

## 2016-06-11 DIAGNOSIS — Z139 Encounter for screening, unspecified: Secondary | ICD-10-CM | POA: Diagnosis not present

## 2016-06-11 DIAGNOSIS — I63511 Cerebral infarction due to unspecified occlusion or stenosis of right middle cerebral artery: Secondary | ICD-10-CM | POA: Diagnosis not present

## 2016-06-11 DIAGNOSIS — Z23 Encounter for immunization: Secondary | ICD-10-CM | POA: Diagnosis not present

## 2016-07-28 DIAGNOSIS — I63511 Cerebral infarction due to unspecified occlusion or stenosis of right middle cerebral artery: Secondary | ICD-10-CM | POA: Diagnosis not present

## 2016-07-28 DIAGNOSIS — Z79899 Other long term (current) drug therapy: Secondary | ICD-10-CM | POA: Diagnosis not present

## 2016-07-28 DIAGNOSIS — I2510 Atherosclerotic heart disease of native coronary artery without angina pectoris: Secondary | ICD-10-CM | POA: Diagnosis not present

## 2016-07-28 DIAGNOSIS — I6523 Occlusion and stenosis of bilateral carotid arteries: Secondary | ICD-10-CM | POA: Diagnosis not present

## 2016-07-28 DIAGNOSIS — E039 Hypothyroidism, unspecified: Secondary | ICD-10-CM | POA: Diagnosis not present

## 2016-07-28 DIAGNOSIS — E785 Hyperlipidemia, unspecified: Secondary | ICD-10-CM | POA: Diagnosis not present

## 2016-08-25 DIAGNOSIS — R0602 Shortness of breath: Secondary | ICD-10-CM | POA: Diagnosis not present

## 2016-08-25 DIAGNOSIS — Z6822 Body mass index (BMI) 22.0-22.9, adult: Secondary | ICD-10-CM | POA: Diagnosis not present

## 2016-08-26 DIAGNOSIS — R0602 Shortness of breath: Secondary | ICD-10-CM | POA: Diagnosis not present

## 2016-08-27 DIAGNOSIS — C50911 Malignant neoplasm of unspecified site of right female breast: Secondary | ICD-10-CM | POA: Diagnosis not present

## 2016-09-28 DIAGNOSIS — Z79899 Other long term (current) drug therapy: Secondary | ICD-10-CM | POA: Diagnosis not present

## 2016-09-28 DIAGNOSIS — Z955 Presence of coronary angioplasty implant and graft: Secondary | ICD-10-CM | POA: Diagnosis not present

## 2016-09-28 DIAGNOSIS — Z87891 Personal history of nicotine dependence: Secondary | ICD-10-CM | POA: Diagnosis not present

## 2016-09-28 DIAGNOSIS — Z8673 Personal history of transient ischemic attack (TIA), and cerebral infarction without residual deficits: Secondary | ICD-10-CM | POA: Diagnosis not present

## 2016-09-28 DIAGNOSIS — E039 Hypothyroidism, unspecified: Secondary | ICD-10-CM | POA: Diagnosis not present

## 2016-09-28 DIAGNOSIS — C773 Secondary and unspecified malignant neoplasm of axilla and upper limb lymph nodes: Secondary | ICD-10-CM | POA: Diagnosis not present

## 2016-09-28 DIAGNOSIS — Z7982 Long term (current) use of aspirin: Secondary | ICD-10-CM | POA: Diagnosis not present

## 2016-09-28 DIAGNOSIS — C50411 Malignant neoplasm of upper-outer quadrant of right female breast: Secondary | ICD-10-CM | POA: Diagnosis not present

## 2016-09-28 DIAGNOSIS — I10 Essential (primary) hypertension: Secondary | ICD-10-CM | POA: Diagnosis not present

## 2016-09-28 DIAGNOSIS — I2510 Atherosclerotic heart disease of native coronary artery without angina pectoris: Secondary | ICD-10-CM | POA: Diagnosis not present

## 2016-09-28 DIAGNOSIS — C50811 Malignant neoplasm of overlapping sites of right female breast: Secondary | ICD-10-CM | POA: Diagnosis not present

## 2016-09-28 DIAGNOSIS — C50911 Malignant neoplasm of unspecified site of right female breast: Secondary | ICD-10-CM | POA: Diagnosis not present

## 2016-09-28 DIAGNOSIS — K219 Gastro-esophageal reflux disease without esophagitis: Secondary | ICD-10-CM | POA: Diagnosis not present

## 2016-09-28 DIAGNOSIS — N6311 Unspecified lump in the right breast, upper outer quadrant: Secondary | ICD-10-CM | POA: Diagnosis not present

## 2016-09-28 DIAGNOSIS — E7800 Pure hypercholesterolemia, unspecified: Secondary | ICD-10-CM | POA: Diagnosis not present

## 2016-10-15 DIAGNOSIS — Z7901 Long term (current) use of anticoagulants: Secondary | ICD-10-CM

## 2016-10-15 DIAGNOSIS — Z79811 Long term (current) use of aromatase inhibitors: Secondary | ICD-10-CM | POA: Diagnosis not present

## 2016-10-15 DIAGNOSIS — Z8673 Personal history of transient ischemic attack (TIA), and cerebral infarction without residual deficits: Secondary | ICD-10-CM | POA: Diagnosis not present

## 2016-10-15 DIAGNOSIS — I252 Old myocardial infarction: Secondary | ICD-10-CM | POA: Diagnosis not present

## 2016-10-15 DIAGNOSIS — Z86718 Personal history of other venous thrombosis and embolism: Secondary | ICD-10-CM | POA: Diagnosis not present

## 2016-10-15 DIAGNOSIS — Z170 Estrogen receptor positive status [ER+]: Secondary | ICD-10-CM | POA: Diagnosis not present

## 2016-10-15 DIAGNOSIS — C50411 Malignant neoplasm of upper-outer quadrant of right female breast: Secondary | ICD-10-CM | POA: Diagnosis not present

## 2016-10-22 DIAGNOSIS — C50911 Malignant neoplasm of unspecified site of right female breast: Secondary | ICD-10-CM | POA: Diagnosis not present

## 2016-10-22 DIAGNOSIS — C50411 Malignant neoplasm of upper-outer quadrant of right female breast: Secondary | ICD-10-CM | POA: Diagnosis not present

## 2016-10-23 DIAGNOSIS — M810 Age-related osteoporosis without current pathological fracture: Secondary | ICD-10-CM | POA: Diagnosis not present

## 2016-10-23 DIAGNOSIS — M8589 Other specified disorders of bone density and structure, multiple sites: Secondary | ICD-10-CM | POA: Diagnosis not present

## 2016-11-30 DIAGNOSIS — Z961 Presence of intraocular lens: Secondary | ICD-10-CM | POA: Diagnosis not present

## 2017-01-28 DIAGNOSIS — Z9181 History of falling: Secondary | ICD-10-CM | POA: Diagnosis not present

## 2017-01-28 DIAGNOSIS — I2510 Atherosclerotic heart disease of native coronary artery without angina pectoris: Secondary | ICD-10-CM | POA: Diagnosis not present

## 2017-01-28 DIAGNOSIS — I63511 Cerebral infarction due to unspecified occlusion or stenosis of right middle cerebral artery: Secondary | ICD-10-CM | POA: Diagnosis not present

## 2017-01-28 DIAGNOSIS — Z139 Encounter for screening, unspecified: Secondary | ICD-10-CM | POA: Diagnosis not present

## 2017-01-28 DIAGNOSIS — Z79899 Other long term (current) drug therapy: Secondary | ICD-10-CM | POA: Diagnosis not present

## 2017-01-28 DIAGNOSIS — Z1331 Encounter for screening for depression: Secondary | ICD-10-CM | POA: Diagnosis not present

## 2017-01-28 DIAGNOSIS — E785 Hyperlipidemia, unspecified: Secondary | ICD-10-CM | POA: Diagnosis not present

## 2017-01-28 DIAGNOSIS — E039 Hypothyroidism, unspecified: Secondary | ICD-10-CM | POA: Diagnosis not present

## 2017-02-08 DIAGNOSIS — Z79811 Long term (current) use of aromatase inhibitors: Secondary | ICD-10-CM | POA: Diagnosis not present

## 2017-02-08 DIAGNOSIS — Z853 Personal history of malignant neoplasm of breast: Secondary | ICD-10-CM | POA: Diagnosis not present

## 2017-02-08 DIAGNOSIS — M810 Age-related osteoporosis without current pathological fracture: Secondary | ICD-10-CM | POA: Diagnosis not present

## 2017-02-08 DIAGNOSIS — C50411 Malignant neoplasm of upper-outer quadrant of right female breast: Secondary | ICD-10-CM | POA: Diagnosis not present

## 2017-02-16 DIAGNOSIS — C50911 Malignant neoplasm of unspecified site of right female breast: Secondary | ICD-10-CM | POA: Diagnosis not present

## 2017-02-24 DIAGNOSIS — J9601 Acute respiratory failure with hypoxia: Secondary | ICD-10-CM | POA: Diagnosis not present

## 2017-02-24 DIAGNOSIS — R0602 Shortness of breath: Secondary | ICD-10-CM | POA: Diagnosis not present

## 2017-02-24 DIAGNOSIS — I509 Heart failure, unspecified: Secondary | ICD-10-CM | POA: Diagnosis not present

## 2017-02-24 DIAGNOSIS — E872 Acidosis: Secondary | ICD-10-CM | POA: Diagnosis not present

## 2017-02-24 DIAGNOSIS — I110 Hypertensive heart disease with heart failure: Secondary | ICD-10-CM | POA: Diagnosis not present

## 2017-02-24 DIAGNOSIS — R069 Unspecified abnormalities of breathing: Secondary | ICD-10-CM | POA: Diagnosis not present

## 2017-02-25 DIAGNOSIS — I5021 Acute systolic (congestive) heart failure: Secondary | ICD-10-CM | POA: Diagnosis not present

## 2017-02-25 DIAGNOSIS — I509 Heart failure, unspecified: Secondary | ICD-10-CM | POA: Diagnosis not present

## 2017-02-25 DIAGNOSIS — I252 Old myocardial infarction: Secondary | ICD-10-CM | POA: Diagnosis not present

## 2017-02-25 DIAGNOSIS — J90 Pleural effusion, not elsewhere classified: Secondary | ICD-10-CM | POA: Diagnosis not present

## 2017-02-25 DIAGNOSIS — I255 Ischemic cardiomyopathy: Secondary | ICD-10-CM | POA: Diagnosis not present

## 2017-02-25 DIAGNOSIS — I110 Hypertensive heart disease with heart failure: Secondary | ICD-10-CM | POA: Diagnosis not present

## 2017-02-25 DIAGNOSIS — Z79899 Other long term (current) drug therapy: Secondary | ICD-10-CM | POA: Diagnosis not present

## 2017-02-25 DIAGNOSIS — J9602 Acute respiratory failure with hypercapnia: Secondary | ICD-10-CM | POA: Diagnosis not present

## 2017-02-25 DIAGNOSIS — I248 Other forms of acute ischemic heart disease: Secondary | ICD-10-CM | POA: Diagnosis not present

## 2017-02-25 DIAGNOSIS — I2510 Atherosclerotic heart disease of native coronary artery without angina pectoris: Secondary | ICD-10-CM | POA: Diagnosis not present

## 2017-02-25 DIAGNOSIS — J189 Pneumonia, unspecified organism: Secondary | ICD-10-CM | POA: Diagnosis not present

## 2017-02-25 DIAGNOSIS — I69928 Other speech and language deficits following unspecified cerebrovascular disease: Secondary | ICD-10-CM | POA: Diagnosis not present

## 2017-02-25 DIAGNOSIS — Z951 Presence of aortocoronary bypass graft: Secondary | ICD-10-CM | POA: Diagnosis not present

## 2017-02-25 DIAGNOSIS — I5023 Acute on chronic systolic (congestive) heart failure: Secondary | ICD-10-CM | POA: Diagnosis not present

## 2017-02-25 DIAGNOSIS — E872 Acidosis: Secondary | ICD-10-CM | POA: Diagnosis not present

## 2017-02-25 DIAGNOSIS — Z853 Personal history of malignant neoplasm of breast: Secondary | ICD-10-CM | POA: Diagnosis not present

## 2017-02-25 DIAGNOSIS — R0602 Shortness of breath: Secondary | ICD-10-CM | POA: Diagnosis not present

## 2017-02-25 DIAGNOSIS — Z87891 Personal history of nicotine dependence: Secondary | ICD-10-CM | POA: Diagnosis not present

## 2017-02-25 DIAGNOSIS — E039 Hypothyroidism, unspecified: Secondary | ICD-10-CM | POA: Diagnosis not present

## 2017-02-25 DIAGNOSIS — J9601 Acute respiratory failure with hypoxia: Secondary | ICD-10-CM | POA: Diagnosis not present

## 2017-02-25 DIAGNOSIS — Z955 Presence of coronary angioplasty implant and graft: Secondary | ICD-10-CM | POA: Diagnosis not present

## 2017-02-25 DIAGNOSIS — Z4682 Encounter for fitting and adjustment of non-vascular catheter: Secondary | ICD-10-CM | POA: Diagnosis not present

## 2017-02-25 DIAGNOSIS — I959 Hypotension, unspecified: Secondary | ICD-10-CM | POA: Diagnosis not present

## 2017-02-25 DIAGNOSIS — Z7982 Long term (current) use of aspirin: Secondary | ICD-10-CM | POA: Diagnosis not present

## 2017-02-26 DIAGNOSIS — J9602 Acute respiratory failure with hypercapnia: Secondary | ICD-10-CM | POA: Diagnosis not present

## 2017-02-26 DIAGNOSIS — I5021 Acute systolic (congestive) heart failure: Secondary | ICD-10-CM | POA: Diagnosis not present

## 2017-02-26 DIAGNOSIS — I959 Hypotension, unspecified: Secondary | ICD-10-CM | POA: Diagnosis not present

## 2017-02-26 DIAGNOSIS — I2510 Atherosclerotic heart disease of native coronary artery without angina pectoris: Secondary | ICD-10-CM | POA: Diagnosis not present

## 2017-02-26 DIAGNOSIS — J189 Pneumonia, unspecified organism: Secondary | ICD-10-CM | POA: Diagnosis not present

## 2017-02-26 DIAGNOSIS — E872 Acidosis: Secondary | ICD-10-CM | POA: Diagnosis not present

## 2017-02-27 DIAGNOSIS — J9602 Acute respiratory failure with hypercapnia: Secondary | ICD-10-CM | POA: Diagnosis not present

## 2017-02-27 DIAGNOSIS — J189 Pneumonia, unspecified organism: Secondary | ICD-10-CM | POA: Diagnosis not present

## 2017-02-27 DIAGNOSIS — I959 Hypotension, unspecified: Secondary | ICD-10-CM | POA: Diagnosis not present

## 2017-02-27 DIAGNOSIS — E872 Acidosis: Secondary | ICD-10-CM | POA: Diagnosis not present

## 2017-03-01 DIAGNOSIS — Z7982 Long term (current) use of aspirin: Secondary | ICD-10-CM | POA: Diagnosis not present

## 2017-03-01 DIAGNOSIS — C50919 Malignant neoplasm of unspecified site of unspecified female breast: Secondary | ICD-10-CM | POA: Diagnosis not present

## 2017-03-01 DIAGNOSIS — I69391 Dysphagia following cerebral infarction: Secondary | ICD-10-CM | POA: Diagnosis not present

## 2017-03-01 DIAGNOSIS — M1991 Primary osteoarthritis, unspecified site: Secondary | ICD-10-CM | POA: Diagnosis not present

## 2017-03-01 DIAGNOSIS — Z951 Presence of aortocoronary bypass graft: Secondary | ICD-10-CM | POA: Diagnosis not present

## 2017-03-01 DIAGNOSIS — I2510 Atherosclerotic heart disease of native coronary artery without angina pectoris: Secondary | ICD-10-CM | POA: Diagnosis not present

## 2017-03-01 DIAGNOSIS — I69322 Dysarthria following cerebral infarction: Secondary | ICD-10-CM | POA: Diagnosis not present

## 2017-03-01 DIAGNOSIS — I110 Hypertensive heart disease with heart failure: Secondary | ICD-10-CM | POA: Diagnosis not present

## 2017-03-01 DIAGNOSIS — R1312 Dysphagia, oropharyngeal phase: Secondary | ICD-10-CM | POA: Diagnosis not present

## 2017-03-01 DIAGNOSIS — Z79891 Long term (current) use of opiate analgesic: Secondary | ICD-10-CM | POA: Diagnosis not present

## 2017-03-01 DIAGNOSIS — I5023 Acute on chronic systolic (congestive) heart failure: Secondary | ICD-10-CM | POA: Diagnosis not present

## 2017-03-02 DIAGNOSIS — I69391 Dysphagia following cerebral infarction: Secondary | ICD-10-CM | POA: Diagnosis not present

## 2017-03-02 DIAGNOSIS — I69322 Dysarthria following cerebral infarction: Secondary | ICD-10-CM | POA: Diagnosis not present

## 2017-03-02 DIAGNOSIS — Z79891 Long term (current) use of opiate analgesic: Secondary | ICD-10-CM | POA: Diagnosis not present

## 2017-03-02 DIAGNOSIS — I110 Hypertensive heart disease with heart failure: Secondary | ICD-10-CM | POA: Diagnosis not present

## 2017-03-02 DIAGNOSIS — Z7982 Long term (current) use of aspirin: Secondary | ICD-10-CM | POA: Diagnosis not present

## 2017-03-02 DIAGNOSIS — I5023 Acute on chronic systolic (congestive) heart failure: Secondary | ICD-10-CM | POA: Diagnosis not present

## 2017-03-02 DIAGNOSIS — I2510 Atherosclerotic heart disease of native coronary artery without angina pectoris: Secondary | ICD-10-CM | POA: Diagnosis not present

## 2017-03-02 DIAGNOSIS — M1991 Primary osteoarthritis, unspecified site: Secondary | ICD-10-CM | POA: Diagnosis not present

## 2017-03-02 DIAGNOSIS — R1312 Dysphagia, oropharyngeal phase: Secondary | ICD-10-CM | POA: Diagnosis not present

## 2017-03-02 DIAGNOSIS — C50919 Malignant neoplasm of unspecified site of unspecified female breast: Secondary | ICD-10-CM | POA: Diagnosis not present

## 2017-03-02 DIAGNOSIS — Z951 Presence of aortocoronary bypass graft: Secondary | ICD-10-CM | POA: Diagnosis not present

## 2017-03-04 DIAGNOSIS — I2510 Atherosclerotic heart disease of native coronary artery without angina pectoris: Secondary | ICD-10-CM | POA: Diagnosis not present

## 2017-03-04 DIAGNOSIS — D649 Anemia, unspecified: Secondary | ICD-10-CM | POA: Diagnosis not present

## 2017-03-04 DIAGNOSIS — E785 Hyperlipidemia, unspecified: Secondary | ICD-10-CM | POA: Diagnosis not present

## 2017-03-04 DIAGNOSIS — Z139 Encounter for screening, unspecified: Secondary | ICD-10-CM | POA: Diagnosis not present

## 2017-03-04 DIAGNOSIS — Z79899 Other long term (current) drug therapy: Secondary | ICD-10-CM | POA: Diagnosis not present

## 2017-03-04 DIAGNOSIS — I63511 Cerebral infarction due to unspecified occlusion or stenosis of right middle cerebral artery: Secondary | ICD-10-CM | POA: Diagnosis not present

## 2017-03-04 DIAGNOSIS — I5023 Acute on chronic systolic (congestive) heart failure: Secondary | ICD-10-CM | POA: Diagnosis not present

## 2017-03-05 DIAGNOSIS — Z79891 Long term (current) use of opiate analgesic: Secondary | ICD-10-CM | POA: Diagnosis not present

## 2017-03-05 DIAGNOSIS — I5023 Acute on chronic systolic (congestive) heart failure: Secondary | ICD-10-CM | POA: Diagnosis not present

## 2017-03-05 DIAGNOSIS — C50919 Malignant neoplasm of unspecified site of unspecified female breast: Secondary | ICD-10-CM | POA: Diagnosis not present

## 2017-03-05 DIAGNOSIS — M1991 Primary osteoarthritis, unspecified site: Secondary | ICD-10-CM | POA: Diagnosis not present

## 2017-03-05 DIAGNOSIS — I69391 Dysphagia following cerebral infarction: Secondary | ICD-10-CM | POA: Diagnosis not present

## 2017-03-05 DIAGNOSIS — I110 Hypertensive heart disease with heart failure: Secondary | ICD-10-CM | POA: Diagnosis not present

## 2017-03-05 DIAGNOSIS — Z951 Presence of aortocoronary bypass graft: Secondary | ICD-10-CM | POA: Diagnosis not present

## 2017-03-05 DIAGNOSIS — I69322 Dysarthria following cerebral infarction: Secondary | ICD-10-CM | POA: Diagnosis not present

## 2017-03-05 DIAGNOSIS — R1312 Dysphagia, oropharyngeal phase: Secondary | ICD-10-CM | POA: Diagnosis not present

## 2017-03-05 DIAGNOSIS — I2510 Atherosclerotic heart disease of native coronary artery without angina pectoris: Secondary | ICD-10-CM | POA: Diagnosis not present

## 2017-03-05 DIAGNOSIS — Z7982 Long term (current) use of aspirin: Secondary | ICD-10-CM | POA: Diagnosis not present

## 2017-03-08 DIAGNOSIS — I5023 Acute on chronic systolic (congestive) heart failure: Secondary | ICD-10-CM | POA: Diagnosis not present

## 2017-03-08 DIAGNOSIS — Z79891 Long term (current) use of opiate analgesic: Secondary | ICD-10-CM | POA: Diagnosis not present

## 2017-03-08 DIAGNOSIS — M1991 Primary osteoarthritis, unspecified site: Secondary | ICD-10-CM | POA: Diagnosis not present

## 2017-03-08 DIAGNOSIS — R1312 Dysphagia, oropharyngeal phase: Secondary | ICD-10-CM | POA: Diagnosis not present

## 2017-03-08 DIAGNOSIS — Z7982 Long term (current) use of aspirin: Secondary | ICD-10-CM | POA: Diagnosis not present

## 2017-03-08 DIAGNOSIS — I110 Hypertensive heart disease with heart failure: Secondary | ICD-10-CM | POA: Diagnosis not present

## 2017-03-08 DIAGNOSIS — Z951 Presence of aortocoronary bypass graft: Secondary | ICD-10-CM | POA: Diagnosis not present

## 2017-03-08 DIAGNOSIS — I69322 Dysarthria following cerebral infarction: Secondary | ICD-10-CM | POA: Diagnosis not present

## 2017-03-08 DIAGNOSIS — C50919 Malignant neoplasm of unspecified site of unspecified female breast: Secondary | ICD-10-CM | POA: Diagnosis not present

## 2017-03-08 DIAGNOSIS — I69391 Dysphagia following cerebral infarction: Secondary | ICD-10-CM | POA: Diagnosis not present

## 2017-03-08 DIAGNOSIS — I2510 Atherosclerotic heart disease of native coronary artery without angina pectoris: Secondary | ICD-10-CM | POA: Diagnosis not present

## 2017-03-08 DIAGNOSIS — E875 Hyperkalemia: Secondary | ICD-10-CM | POA: Diagnosis not present

## 2017-03-12 DIAGNOSIS — R1312 Dysphagia, oropharyngeal phase: Secondary | ICD-10-CM | POA: Diagnosis not present

## 2017-03-12 DIAGNOSIS — I69391 Dysphagia following cerebral infarction: Secondary | ICD-10-CM | POA: Diagnosis not present

## 2017-03-12 DIAGNOSIS — M1991 Primary osteoarthritis, unspecified site: Secondary | ICD-10-CM | POA: Diagnosis not present

## 2017-03-12 DIAGNOSIS — C50919 Malignant neoplasm of unspecified site of unspecified female breast: Secondary | ICD-10-CM | POA: Diagnosis not present

## 2017-03-12 DIAGNOSIS — I69322 Dysarthria following cerebral infarction: Secondary | ICD-10-CM | POA: Diagnosis not present

## 2017-03-12 DIAGNOSIS — Z7982 Long term (current) use of aspirin: Secondary | ICD-10-CM | POA: Diagnosis not present

## 2017-03-12 DIAGNOSIS — I110 Hypertensive heart disease with heart failure: Secondary | ICD-10-CM | POA: Diagnosis not present

## 2017-03-12 DIAGNOSIS — Z79891 Long term (current) use of opiate analgesic: Secondary | ICD-10-CM | POA: Diagnosis not present

## 2017-03-12 DIAGNOSIS — Z951 Presence of aortocoronary bypass graft: Secondary | ICD-10-CM | POA: Diagnosis not present

## 2017-03-12 DIAGNOSIS — I5023 Acute on chronic systolic (congestive) heart failure: Secondary | ICD-10-CM | POA: Diagnosis not present

## 2017-03-12 DIAGNOSIS — I2510 Atherosclerotic heart disease of native coronary artery without angina pectoris: Secondary | ICD-10-CM | POA: Diagnosis not present

## 2017-03-15 DIAGNOSIS — I255 Ischemic cardiomyopathy: Secondary | ICD-10-CM | POA: Insufficient documentation

## 2017-03-15 DIAGNOSIS — I351 Nonrheumatic aortic (valve) insufficiency: Secondary | ICD-10-CM | POA: Insufficient documentation

## 2017-03-15 DIAGNOSIS — I340 Nonrheumatic mitral (valve) insufficiency: Secondary | ICD-10-CM

## 2017-03-15 DIAGNOSIS — I493 Ventricular premature depolarization: Secondary | ICD-10-CM | POA: Diagnosis not present

## 2017-03-15 DIAGNOSIS — Z789 Other specified health status: Secondary | ICD-10-CM

## 2017-03-15 DIAGNOSIS — I5021 Acute systolic (congestive) heart failure: Secondary | ICD-10-CM

## 2017-03-15 DIAGNOSIS — I420 Dilated cardiomyopathy: Secondary | ICD-10-CM | POA: Insufficient documentation

## 2017-03-15 DIAGNOSIS — I214 Non-ST elevation (NSTEMI) myocardial infarction: Secondary | ICD-10-CM | POA: Diagnosis not present

## 2017-03-15 DIAGNOSIS — Z951 Presence of aortocoronary bypass graft: Secondary | ICD-10-CM

## 2017-03-15 HISTORY — DX: Acute systolic (congestive) heart failure: I50.21

## 2017-03-15 HISTORY — DX: Nonrheumatic mitral (valve) insufficiency: I34.0

## 2017-03-15 HISTORY — DX: Presence of aortocoronary bypass graft: Z95.1

## 2017-03-15 HISTORY — DX: Nonrheumatic aortic (valve) insufficiency: I35.1

## 2017-03-15 HISTORY — DX: Other specified health status: Z78.9

## 2017-03-15 HISTORY — DX: Dilated cardiomyopathy: I25.5

## 2017-03-18 DIAGNOSIS — Z79891 Long term (current) use of opiate analgesic: Secondary | ICD-10-CM | POA: Diagnosis not present

## 2017-03-18 DIAGNOSIS — C50919 Malignant neoplasm of unspecified site of unspecified female breast: Secondary | ICD-10-CM | POA: Diagnosis not present

## 2017-03-18 DIAGNOSIS — M1991 Primary osteoarthritis, unspecified site: Secondary | ICD-10-CM | POA: Diagnosis not present

## 2017-03-18 DIAGNOSIS — Z951 Presence of aortocoronary bypass graft: Secondary | ICD-10-CM | POA: Diagnosis not present

## 2017-03-18 DIAGNOSIS — Z7982 Long term (current) use of aspirin: Secondary | ICD-10-CM | POA: Diagnosis not present

## 2017-03-18 DIAGNOSIS — I2510 Atherosclerotic heart disease of native coronary artery without angina pectoris: Secondary | ICD-10-CM | POA: Diagnosis not present

## 2017-03-18 DIAGNOSIS — I69322 Dysarthria following cerebral infarction: Secondary | ICD-10-CM | POA: Diagnosis not present

## 2017-03-18 DIAGNOSIS — I110 Hypertensive heart disease with heart failure: Secondary | ICD-10-CM | POA: Diagnosis not present

## 2017-03-18 DIAGNOSIS — I69391 Dysphagia following cerebral infarction: Secondary | ICD-10-CM | POA: Diagnosis not present

## 2017-03-18 DIAGNOSIS — I5023 Acute on chronic systolic (congestive) heart failure: Secondary | ICD-10-CM | POA: Diagnosis not present

## 2017-03-18 DIAGNOSIS — R1312 Dysphagia, oropharyngeal phase: Secondary | ICD-10-CM | POA: Diagnosis not present

## 2017-03-23 DIAGNOSIS — Z79891 Long term (current) use of opiate analgesic: Secondary | ICD-10-CM | POA: Diagnosis not present

## 2017-03-23 DIAGNOSIS — R1312 Dysphagia, oropharyngeal phase: Secondary | ICD-10-CM | POA: Diagnosis not present

## 2017-03-23 DIAGNOSIS — I69391 Dysphagia following cerebral infarction: Secondary | ICD-10-CM | POA: Diagnosis not present

## 2017-03-23 DIAGNOSIS — I5023 Acute on chronic systolic (congestive) heart failure: Secondary | ICD-10-CM | POA: Diagnosis not present

## 2017-03-23 DIAGNOSIS — Z7982 Long term (current) use of aspirin: Secondary | ICD-10-CM | POA: Diagnosis not present

## 2017-03-23 DIAGNOSIS — I2510 Atherosclerotic heart disease of native coronary artery without angina pectoris: Secondary | ICD-10-CM | POA: Diagnosis not present

## 2017-03-23 DIAGNOSIS — I69322 Dysarthria following cerebral infarction: Secondary | ICD-10-CM | POA: Diagnosis not present

## 2017-03-23 DIAGNOSIS — I110 Hypertensive heart disease with heart failure: Secondary | ICD-10-CM | POA: Diagnosis not present

## 2017-03-23 DIAGNOSIS — Z951 Presence of aortocoronary bypass graft: Secondary | ICD-10-CM | POA: Diagnosis not present

## 2017-03-23 DIAGNOSIS — C50919 Malignant neoplasm of unspecified site of unspecified female breast: Secondary | ICD-10-CM | POA: Diagnosis not present

## 2017-03-23 DIAGNOSIS — M1991 Primary osteoarthritis, unspecified site: Secondary | ICD-10-CM | POA: Diagnosis not present

## 2017-03-26 DIAGNOSIS — R040 Epistaxis: Secondary | ICD-10-CM | POA: Diagnosis not present

## 2017-03-30 DIAGNOSIS — I69391 Dysphagia following cerebral infarction: Secondary | ICD-10-CM | POA: Diagnosis not present

## 2017-03-30 DIAGNOSIS — Z951 Presence of aortocoronary bypass graft: Secondary | ICD-10-CM | POA: Diagnosis not present

## 2017-03-30 DIAGNOSIS — Z7982 Long term (current) use of aspirin: Secondary | ICD-10-CM | POA: Diagnosis not present

## 2017-03-30 DIAGNOSIS — I2510 Atherosclerotic heart disease of native coronary artery without angina pectoris: Secondary | ICD-10-CM | POA: Diagnosis not present

## 2017-03-30 DIAGNOSIS — I110 Hypertensive heart disease with heart failure: Secondary | ICD-10-CM | POA: Diagnosis not present

## 2017-03-30 DIAGNOSIS — R1312 Dysphagia, oropharyngeal phase: Secondary | ICD-10-CM | POA: Diagnosis not present

## 2017-03-30 DIAGNOSIS — I69322 Dysarthria following cerebral infarction: Secondary | ICD-10-CM | POA: Diagnosis not present

## 2017-03-30 DIAGNOSIS — M1991 Primary osteoarthritis, unspecified site: Secondary | ICD-10-CM | POA: Diagnosis not present

## 2017-03-30 DIAGNOSIS — Z79891 Long term (current) use of opiate analgesic: Secondary | ICD-10-CM | POA: Diagnosis not present

## 2017-03-30 DIAGNOSIS — I5023 Acute on chronic systolic (congestive) heart failure: Secondary | ICD-10-CM | POA: Diagnosis not present

## 2017-03-30 DIAGNOSIS — C50919 Malignant neoplasm of unspecified site of unspecified female breast: Secondary | ICD-10-CM | POA: Diagnosis not present

## 2017-04-05 DIAGNOSIS — M1991 Primary osteoarthritis, unspecified site: Secondary | ICD-10-CM | POA: Diagnosis not present

## 2017-04-05 DIAGNOSIS — Z951 Presence of aortocoronary bypass graft: Secondary | ICD-10-CM | POA: Diagnosis not present

## 2017-04-05 DIAGNOSIS — R1312 Dysphagia, oropharyngeal phase: Secondary | ICD-10-CM | POA: Diagnosis not present

## 2017-04-05 DIAGNOSIS — R399 Unspecified symptoms and signs involving the genitourinary system: Secondary | ICD-10-CM | POA: Diagnosis not present

## 2017-04-05 DIAGNOSIS — I69322 Dysarthria following cerebral infarction: Secondary | ICD-10-CM | POA: Diagnosis not present

## 2017-04-05 DIAGNOSIS — I5023 Acute on chronic systolic (congestive) heart failure: Secondary | ICD-10-CM | POA: Diagnosis not present

## 2017-04-05 DIAGNOSIS — I480 Paroxysmal atrial fibrillation: Secondary | ICD-10-CM | POA: Diagnosis not present

## 2017-04-05 DIAGNOSIS — C50919 Malignant neoplasm of unspecified site of unspecified female breast: Secondary | ICD-10-CM | POA: Diagnosis not present

## 2017-04-05 DIAGNOSIS — I69391 Dysphagia following cerebral infarction: Secondary | ICD-10-CM | POA: Diagnosis not present

## 2017-04-05 DIAGNOSIS — I110 Hypertensive heart disease with heart failure: Secondary | ICD-10-CM | POA: Diagnosis not present

## 2017-04-05 DIAGNOSIS — I2510 Atherosclerotic heart disease of native coronary artery without angina pectoris: Secondary | ICD-10-CM | POA: Diagnosis not present

## 2017-04-05 DIAGNOSIS — Z79891 Long term (current) use of opiate analgesic: Secondary | ICD-10-CM | POA: Diagnosis not present

## 2017-04-05 DIAGNOSIS — Z7982 Long term (current) use of aspirin: Secondary | ICD-10-CM | POA: Diagnosis not present

## 2017-04-06 DIAGNOSIS — M1991 Primary osteoarthritis, unspecified site: Secondary | ICD-10-CM | POA: Diagnosis not present

## 2017-04-06 DIAGNOSIS — I69322 Dysarthria following cerebral infarction: Secondary | ICD-10-CM | POA: Diagnosis not present

## 2017-04-06 DIAGNOSIS — R0989 Other specified symptoms and signs involving the circulatory and respiratory systems: Secondary | ICD-10-CM | POA: Diagnosis not present

## 2017-04-06 DIAGNOSIS — Z79891 Long term (current) use of opiate analgesic: Secondary | ICD-10-CM | POA: Diagnosis not present

## 2017-04-06 DIAGNOSIS — I110 Hypertensive heart disease with heart failure: Secondary | ICD-10-CM | POA: Diagnosis not present

## 2017-04-06 DIAGNOSIS — Z951 Presence of aortocoronary bypass graft: Secondary | ICD-10-CM | POA: Diagnosis not present

## 2017-04-06 DIAGNOSIS — Z7982 Long term (current) use of aspirin: Secondary | ICD-10-CM | POA: Diagnosis not present

## 2017-04-06 DIAGNOSIS — Z952 Presence of prosthetic heart valve: Secondary | ICD-10-CM | POA: Diagnosis not present

## 2017-04-06 DIAGNOSIS — C50919 Malignant neoplasm of unspecified site of unspecified female breast: Secondary | ICD-10-CM | POA: Diagnosis not present

## 2017-04-06 DIAGNOSIS — I2510 Atherosclerotic heart disease of native coronary artery without angina pectoris: Secondary | ICD-10-CM | POA: Diagnosis not present

## 2017-04-06 DIAGNOSIS — R1312 Dysphagia, oropharyngeal phase: Secondary | ICD-10-CM | POA: Diagnosis not present

## 2017-04-06 DIAGNOSIS — I5023 Acute on chronic systolic (congestive) heart failure: Secondary | ICD-10-CM | POA: Diagnosis not present

## 2017-04-06 DIAGNOSIS — I69391 Dysphagia following cerebral infarction: Secondary | ICD-10-CM | POA: Diagnosis not present

## 2017-04-09 DIAGNOSIS — M1991 Primary osteoarthritis, unspecified site: Secondary | ICD-10-CM | POA: Diagnosis not present

## 2017-04-09 DIAGNOSIS — I69322 Dysarthria following cerebral infarction: Secondary | ICD-10-CM | POA: Diagnosis not present

## 2017-04-09 DIAGNOSIS — I69391 Dysphagia following cerebral infarction: Secondary | ICD-10-CM | POA: Diagnosis not present

## 2017-04-09 DIAGNOSIS — Z79891 Long term (current) use of opiate analgesic: Secondary | ICD-10-CM | POA: Diagnosis not present

## 2017-04-09 DIAGNOSIS — Z951 Presence of aortocoronary bypass graft: Secondary | ICD-10-CM | POA: Diagnosis not present

## 2017-04-09 DIAGNOSIS — I5023 Acute on chronic systolic (congestive) heart failure: Secondary | ICD-10-CM | POA: Diagnosis not present

## 2017-04-09 DIAGNOSIS — I2510 Atherosclerotic heart disease of native coronary artery without angina pectoris: Secondary | ICD-10-CM | POA: Diagnosis not present

## 2017-04-09 DIAGNOSIS — I110 Hypertensive heart disease with heart failure: Secondary | ICD-10-CM | POA: Diagnosis not present

## 2017-04-09 DIAGNOSIS — Z7982 Long term (current) use of aspirin: Secondary | ICD-10-CM | POA: Diagnosis not present

## 2017-04-09 DIAGNOSIS — C50919 Malignant neoplasm of unspecified site of unspecified female breast: Secondary | ICD-10-CM | POA: Diagnosis not present

## 2017-04-09 DIAGNOSIS — R1312 Dysphagia, oropharyngeal phase: Secondary | ICD-10-CM | POA: Diagnosis not present

## 2017-04-12 DIAGNOSIS — C50919 Malignant neoplasm of unspecified site of unspecified female breast: Secondary | ICD-10-CM | POA: Diagnosis not present

## 2017-04-12 DIAGNOSIS — M1991 Primary osteoarthritis, unspecified site: Secondary | ICD-10-CM | POA: Diagnosis not present

## 2017-04-12 DIAGNOSIS — I110 Hypertensive heart disease with heart failure: Secondary | ICD-10-CM | POA: Diagnosis not present

## 2017-04-12 DIAGNOSIS — R1312 Dysphagia, oropharyngeal phase: Secondary | ICD-10-CM | POA: Diagnosis not present

## 2017-04-12 DIAGNOSIS — I69391 Dysphagia following cerebral infarction: Secondary | ICD-10-CM | POA: Diagnosis not present

## 2017-04-12 DIAGNOSIS — I5023 Acute on chronic systolic (congestive) heart failure: Secondary | ICD-10-CM | POA: Diagnosis not present

## 2017-04-12 DIAGNOSIS — Z7982 Long term (current) use of aspirin: Secondary | ICD-10-CM | POA: Diagnosis not present

## 2017-04-12 DIAGNOSIS — I69322 Dysarthria following cerebral infarction: Secondary | ICD-10-CM | POA: Diagnosis not present

## 2017-04-12 DIAGNOSIS — I2510 Atherosclerotic heart disease of native coronary artery without angina pectoris: Secondary | ICD-10-CM | POA: Diagnosis not present

## 2017-04-12 DIAGNOSIS — Z951 Presence of aortocoronary bypass graft: Secondary | ICD-10-CM | POA: Diagnosis not present

## 2017-04-12 DIAGNOSIS — Z79891 Long term (current) use of opiate analgesic: Secondary | ICD-10-CM | POA: Diagnosis not present

## 2017-04-15 DIAGNOSIS — R040 Epistaxis: Secondary | ICD-10-CM | POA: Diagnosis not present

## 2017-04-15 DIAGNOSIS — H903 Sensorineural hearing loss, bilateral: Secondary | ICD-10-CM | POA: Diagnosis not present

## 2017-04-20 DIAGNOSIS — Z1331 Encounter for screening for depression: Secondary | ICD-10-CM | POA: Diagnosis not present

## 2017-04-20 DIAGNOSIS — Z9181 History of falling: Secondary | ICD-10-CM | POA: Diagnosis not present

## 2017-04-20 DIAGNOSIS — Z0000 Encounter for general adult medical examination without abnormal findings: Secondary | ICD-10-CM | POA: Diagnosis not present

## 2017-04-20 DIAGNOSIS — Z1231 Encounter for screening mammogram for malignant neoplasm of breast: Secondary | ICD-10-CM | POA: Diagnosis not present

## 2017-04-20 DIAGNOSIS — E785 Hyperlipidemia, unspecified: Secondary | ICD-10-CM | POA: Diagnosis not present

## 2017-04-20 DIAGNOSIS — Z136 Encounter for screening for cardiovascular disorders: Secondary | ICD-10-CM | POA: Diagnosis not present

## 2017-04-28 DIAGNOSIS — M1991 Primary osteoarthritis, unspecified site: Secondary | ICD-10-CM | POA: Diagnosis not present

## 2017-04-28 DIAGNOSIS — C50919 Malignant neoplasm of unspecified site of unspecified female breast: Secondary | ICD-10-CM | POA: Diagnosis not present

## 2017-04-28 DIAGNOSIS — Z951 Presence of aortocoronary bypass graft: Secondary | ICD-10-CM | POA: Diagnosis not present

## 2017-04-28 DIAGNOSIS — I2510 Atherosclerotic heart disease of native coronary artery without angina pectoris: Secondary | ICD-10-CM | POA: Diagnosis not present

## 2017-04-28 DIAGNOSIS — I110 Hypertensive heart disease with heart failure: Secondary | ICD-10-CM | POA: Diagnosis not present

## 2017-04-28 DIAGNOSIS — R1312 Dysphagia, oropharyngeal phase: Secondary | ICD-10-CM | POA: Diagnosis not present

## 2017-04-28 DIAGNOSIS — Z7982 Long term (current) use of aspirin: Secondary | ICD-10-CM | POA: Diagnosis not present

## 2017-04-28 DIAGNOSIS — I5023 Acute on chronic systolic (congestive) heart failure: Secondary | ICD-10-CM | POA: Diagnosis not present

## 2017-04-28 DIAGNOSIS — I69391 Dysphagia following cerebral infarction: Secondary | ICD-10-CM | POA: Diagnosis not present

## 2017-04-28 DIAGNOSIS — Z79891 Long term (current) use of opiate analgesic: Secondary | ICD-10-CM | POA: Diagnosis not present

## 2017-04-28 DIAGNOSIS — I69322 Dysarthria following cerebral infarction: Secondary | ICD-10-CM | POA: Diagnosis not present

## 2017-05-27 DIAGNOSIS — I252 Old myocardial infarction: Secondary | ICD-10-CM | POA: Diagnosis not present

## 2017-05-27 DIAGNOSIS — Z8673 Personal history of transient ischemic attack (TIA), and cerebral infarction without residual deficits: Secondary | ICD-10-CM | POA: Diagnosis not present

## 2017-05-27 DIAGNOSIS — Z955 Presence of coronary angioplasty implant and graft: Secondary | ICD-10-CM | POA: Diagnosis not present

## 2017-05-27 DIAGNOSIS — M810 Age-related osteoporosis without current pathological fracture: Secondary | ICD-10-CM | POA: Diagnosis not present

## 2017-05-27 DIAGNOSIS — Z853 Personal history of malignant neoplasm of breast: Secondary | ICD-10-CM | POA: Diagnosis not present

## 2017-05-28 DIAGNOSIS — I252 Old myocardial infarction: Secondary | ICD-10-CM

## 2017-05-28 DIAGNOSIS — R002 Palpitations: Secondary | ICD-10-CM

## 2017-05-28 DIAGNOSIS — I5021 Acute systolic (congestive) heart failure: Secondary | ICD-10-CM | POA: Diagnosis not present

## 2017-05-28 DIAGNOSIS — I255 Ischemic cardiomyopathy: Secondary | ICD-10-CM | POA: Diagnosis not present

## 2017-05-28 DIAGNOSIS — I25810 Atherosclerosis of coronary artery bypass graft(s) without angina pectoris: Secondary | ICD-10-CM | POA: Diagnosis not present

## 2017-05-28 DIAGNOSIS — E785 Hyperlipidemia, unspecified: Secondary | ICD-10-CM | POA: Diagnosis not present

## 2017-05-28 DIAGNOSIS — E782 Mixed hyperlipidemia: Secondary | ICD-10-CM

## 2017-05-28 HISTORY — DX: Mixed hyperlipidemia: E78.2

## 2017-05-28 HISTORY — DX: Old myocardial infarction: I25.2

## 2017-05-28 HISTORY — DX: Palpitations: R00.2

## 2017-05-31 DIAGNOSIS — R002 Palpitations: Secondary | ICD-10-CM | POA: Diagnosis not present

## 2017-06-03 DIAGNOSIS — C50411 Malignant neoplasm of upper-outer quadrant of right female breast: Secondary | ICD-10-CM | POA: Diagnosis not present

## 2017-06-03 DIAGNOSIS — R928 Other abnormal and inconclusive findings on diagnostic imaging of breast: Secondary | ICD-10-CM | POA: Diagnosis not present

## 2017-07-16 DIAGNOSIS — C50911 Malignant neoplasm of unspecified site of right female breast: Secondary | ICD-10-CM | POA: Diagnosis not present

## 2017-07-20 DIAGNOSIS — I2510 Atherosclerotic heart disease of native coronary artery without angina pectoris: Secondary | ICD-10-CM | POA: Diagnosis not present

## 2017-07-20 DIAGNOSIS — E782 Mixed hyperlipidemia: Secondary | ICD-10-CM | POA: Diagnosis not present

## 2017-07-20 DIAGNOSIS — I255 Ischemic cardiomyopathy: Secondary | ICD-10-CM | POA: Diagnosis not present

## 2017-07-20 DIAGNOSIS — Z951 Presence of aortocoronary bypass graft: Secondary | ICD-10-CM | POA: Diagnosis not present

## 2017-07-20 DIAGNOSIS — Z9889 Other specified postprocedural states: Secondary | ICD-10-CM | POA: Diagnosis not present

## 2017-07-20 DIAGNOSIS — Z7982 Long term (current) use of aspirin: Secondary | ICD-10-CM | POA: Insufficient documentation

## 2017-07-20 HISTORY — DX: Long term (current) use of aspirin: Z79.82

## 2017-08-02 DIAGNOSIS — E039 Hypothyroidism, unspecified: Secondary | ICD-10-CM | POA: Diagnosis not present

## 2017-08-02 DIAGNOSIS — I2510 Atherosclerotic heart disease of native coronary artery without angina pectoris: Secondary | ICD-10-CM | POA: Diagnosis not present

## 2017-08-02 DIAGNOSIS — E785 Hyperlipidemia, unspecified: Secondary | ICD-10-CM | POA: Diagnosis not present

## 2017-08-02 DIAGNOSIS — I63511 Cerebral infarction due to unspecified occlusion or stenosis of right middle cerebral artery: Secondary | ICD-10-CM | POA: Diagnosis not present

## 2017-08-02 DIAGNOSIS — Z139 Encounter for screening, unspecified: Secondary | ICD-10-CM | POA: Diagnosis not present

## 2017-09-02 DIAGNOSIS — Z9889 Other specified postprocedural states: Secondary | ICD-10-CM | POA: Diagnosis not present

## 2017-09-02 DIAGNOSIS — E782 Mixed hyperlipidemia: Secondary | ICD-10-CM | POA: Diagnosis not present

## 2017-09-02 DIAGNOSIS — I2510 Atherosclerotic heart disease of native coronary artery without angina pectoris: Secondary | ICD-10-CM | POA: Diagnosis not present

## 2017-09-02 DIAGNOSIS — Z951 Presence of aortocoronary bypass graft: Secondary | ICD-10-CM | POA: Diagnosis not present

## 2017-09-02 DIAGNOSIS — I255 Ischemic cardiomyopathy: Secondary | ICD-10-CM | POA: Diagnosis not present

## 2017-09-07 DIAGNOSIS — Z853 Personal history of malignant neoplasm of breast: Secondary | ICD-10-CM | POA: Diagnosis not present

## 2017-09-07 DIAGNOSIS — C50411 Malignant neoplasm of upper-outer quadrant of right female breast: Secondary | ICD-10-CM | POA: Diagnosis not present

## 2017-09-07 DIAGNOSIS — Z79811 Long term (current) use of aromatase inhibitors: Secondary | ICD-10-CM | POA: Diagnosis not present

## 2017-09-07 DIAGNOSIS — M810 Age-related osteoporosis without current pathological fracture: Secondary | ICD-10-CM | POA: Diagnosis not present

## 2017-10-22 DIAGNOSIS — J019 Acute sinusitis, unspecified: Secondary | ICD-10-CM | POA: Diagnosis not present

## 2017-10-27 DIAGNOSIS — C44319 Basal cell carcinoma of skin of other parts of face: Secondary | ICD-10-CM | POA: Diagnosis not present

## 2017-10-27 DIAGNOSIS — D2239 Melanocytic nevi of other parts of face: Secondary | ICD-10-CM | POA: Diagnosis not present

## 2017-12-14 DIAGNOSIS — M810 Age-related osteoporosis without current pathological fracture: Secondary | ICD-10-CM | POA: Diagnosis not present

## 2017-12-14 DIAGNOSIS — Z79811 Long term (current) use of aromatase inhibitors: Secondary | ICD-10-CM | POA: Diagnosis not present

## 2017-12-14 DIAGNOSIS — Z853 Personal history of malignant neoplasm of breast: Secondary | ICD-10-CM | POA: Diagnosis not present

## 2017-12-16 DIAGNOSIS — I2510 Atherosclerotic heart disease of native coronary artery without angina pectoris: Secondary | ICD-10-CM | POA: Diagnosis not present

## 2017-12-16 DIAGNOSIS — E782 Mixed hyperlipidemia: Secondary | ICD-10-CM | POA: Diagnosis not present

## 2017-12-16 DIAGNOSIS — Z9889 Other specified postprocedural states: Secondary | ICD-10-CM | POA: Diagnosis not present

## 2017-12-16 DIAGNOSIS — I255 Ischemic cardiomyopathy: Secondary | ICD-10-CM | POA: Diagnosis not present

## 2017-12-16 DIAGNOSIS — Z951 Presence of aortocoronary bypass graft: Secondary | ICD-10-CM | POA: Diagnosis not present

## 2018-01-20 DIAGNOSIS — C50911 Malignant neoplasm of unspecified site of right female breast: Secondary | ICD-10-CM | POA: Diagnosis not present

## 2018-01-20 DIAGNOSIS — Z6822 Body mass index (BMI) 22.0-22.9, adult: Secondary | ICD-10-CM | POA: Diagnosis not present

## 2018-02-07 DIAGNOSIS — Z961 Presence of intraocular lens: Secondary | ICD-10-CM | POA: Diagnosis not present

## 2018-03-02 DIAGNOSIS — I2510 Atherosclerotic heart disease of native coronary artery without angina pectoris: Secondary | ICD-10-CM | POA: Diagnosis not present

## 2018-03-02 DIAGNOSIS — E039 Hypothyroidism, unspecified: Secondary | ICD-10-CM | POA: Diagnosis not present

## 2018-03-02 DIAGNOSIS — I480 Paroxysmal atrial fibrillation: Secondary | ICD-10-CM | POA: Diagnosis not present

## 2018-03-02 DIAGNOSIS — I63511 Cerebral infarction due to unspecified occlusion or stenosis of right middle cerebral artery: Secondary | ICD-10-CM | POA: Diagnosis not present

## 2018-03-02 DIAGNOSIS — E785 Hyperlipidemia, unspecified: Secondary | ICD-10-CM | POA: Diagnosis not present

## 2018-03-05 DIAGNOSIS — I5020 Unspecified systolic (congestive) heart failure: Secondary | ICD-10-CM | POA: Diagnosis not present

## 2018-03-05 DIAGNOSIS — R5381 Other malaise: Secondary | ICD-10-CM | POA: Diagnosis not present

## 2018-03-05 DIAGNOSIS — K260 Acute duodenal ulcer with hemorrhage: Secondary | ICD-10-CM | POA: Diagnosis not present

## 2018-03-05 DIAGNOSIS — R531 Weakness: Secondary | ICD-10-CM | POA: Diagnosis not present

## 2018-03-05 DIAGNOSIS — I5022 Chronic systolic (congestive) heart failure: Secondary | ICD-10-CM | POA: Diagnosis not present

## 2018-03-05 DIAGNOSIS — K449 Diaphragmatic hernia without obstruction or gangrene: Secondary | ICD-10-CM | POA: Diagnosis not present

## 2018-03-05 DIAGNOSIS — Z7982 Long term (current) use of aspirin: Secondary | ICD-10-CM | POA: Diagnosis not present

## 2018-03-05 DIAGNOSIS — Z7902 Long term (current) use of antithrombotics/antiplatelets: Secondary | ICD-10-CM | POA: Diagnosis not present

## 2018-03-05 DIAGNOSIS — I959 Hypotension, unspecified: Secondary | ICD-10-CM | POA: Diagnosis not present

## 2018-03-05 DIAGNOSIS — Z8711 Personal history of peptic ulcer disease: Secondary | ICD-10-CM | POA: Diagnosis not present

## 2018-03-05 DIAGNOSIS — K264 Chronic or unspecified duodenal ulcer with hemorrhage: Secondary | ICD-10-CM | POA: Diagnosis not present

## 2018-03-05 DIAGNOSIS — C50919 Malignant neoplasm of unspecified site of unspecified female breast: Secondary | ICD-10-CM | POA: Diagnosis not present

## 2018-03-05 DIAGNOSIS — Z79899 Other long term (current) drug therapy: Secondary | ICD-10-CM | POA: Diagnosis not present

## 2018-03-05 DIAGNOSIS — Z853 Personal history of malignant neoplasm of breast: Secondary | ICD-10-CM | POA: Diagnosis not present

## 2018-03-05 DIAGNOSIS — M1990 Unspecified osteoarthritis, unspecified site: Secondary | ICD-10-CM | POA: Diagnosis not present

## 2018-03-05 DIAGNOSIS — I252 Old myocardial infarction: Secondary | ICD-10-CM | POA: Diagnosis not present

## 2018-03-05 DIAGNOSIS — Z955 Presence of coronary angioplasty implant and graft: Secondary | ICD-10-CM | POA: Diagnosis not present

## 2018-03-05 DIAGNOSIS — I69928 Other speech and language deficits following unspecified cerebrovascular disease: Secondary | ICD-10-CM | POA: Diagnosis not present

## 2018-03-05 DIAGNOSIS — R739 Hyperglycemia, unspecified: Secondary | ICD-10-CM | POA: Diagnosis not present

## 2018-03-05 DIAGNOSIS — I2510 Atherosclerotic heart disease of native coronary artery without angina pectoris: Secondary | ICD-10-CM | POA: Diagnosis not present

## 2018-03-05 DIAGNOSIS — D72829 Elevated white blood cell count, unspecified: Secondary | ICD-10-CM | POA: Diagnosis not present

## 2018-03-05 DIAGNOSIS — Z91041 Radiographic dye allergy status: Secondary | ICD-10-CM | POA: Diagnosis not present

## 2018-03-05 DIAGNOSIS — N179 Acute kidney failure, unspecified: Secondary | ICD-10-CM | POA: Diagnosis not present

## 2018-03-05 DIAGNOSIS — K920 Hematemesis: Secondary | ICD-10-CM | POA: Diagnosis not present

## 2018-03-05 DIAGNOSIS — D62 Acute posthemorrhagic anemia: Secondary | ICD-10-CM | POA: Diagnosis not present

## 2018-03-05 DIAGNOSIS — K922 Gastrointestinal hemorrhage, unspecified: Secondary | ICD-10-CM | POA: Diagnosis not present

## 2018-03-05 DIAGNOSIS — Z87891 Personal history of nicotine dependence: Secondary | ICD-10-CM | POA: Diagnosis not present

## 2018-03-05 DIAGNOSIS — Z951 Presence of aortocoronary bypass graft: Secondary | ICD-10-CM | POA: Diagnosis not present

## 2018-03-05 DIAGNOSIS — R112 Nausea with vomiting, unspecified: Secondary | ICD-10-CM | POA: Diagnosis not present

## 2018-03-05 DIAGNOSIS — K269 Duodenal ulcer, unspecified as acute or chronic, without hemorrhage or perforation: Secondary | ICD-10-CM | POA: Diagnosis not present

## 2018-03-05 DIAGNOSIS — I110 Hypertensive heart disease with heart failure: Secondary | ICD-10-CM | POA: Diagnosis not present

## 2018-03-05 DIAGNOSIS — E039 Hypothyroidism, unspecified: Secondary | ICD-10-CM | POA: Diagnosis not present

## 2018-03-06 DIAGNOSIS — K922 Gastrointestinal hemorrhage, unspecified: Secondary | ICD-10-CM | POA: Diagnosis not present

## 2018-03-06 DIAGNOSIS — I5020 Unspecified systolic (congestive) heart failure: Secondary | ICD-10-CM | POA: Diagnosis not present

## 2018-03-06 DIAGNOSIS — I2510 Atherosclerotic heart disease of native coronary artery without angina pectoris: Secondary | ICD-10-CM | POA: Diagnosis not present

## 2018-03-06 DIAGNOSIS — I959 Hypotension, unspecified: Secondary | ICD-10-CM | POA: Diagnosis not present

## 2018-03-07 DIAGNOSIS — I959 Hypotension, unspecified: Secondary | ICD-10-CM | POA: Diagnosis not present

## 2018-03-07 DIAGNOSIS — I2510 Atherosclerotic heart disease of native coronary artery without angina pectoris: Secondary | ICD-10-CM | POA: Diagnosis not present

## 2018-03-07 DIAGNOSIS — K922 Gastrointestinal hemorrhage, unspecified: Secondary | ICD-10-CM | POA: Diagnosis not present

## 2018-03-07 DIAGNOSIS — I5020 Unspecified systolic (congestive) heart failure: Secondary | ICD-10-CM | POA: Diagnosis not present

## 2018-03-08 DIAGNOSIS — I959 Hypotension, unspecified: Secondary | ICD-10-CM | POA: Diagnosis not present

## 2018-03-08 DIAGNOSIS — I2510 Atherosclerotic heart disease of native coronary artery without angina pectoris: Secondary | ICD-10-CM | POA: Diagnosis not present

## 2018-03-08 DIAGNOSIS — K922 Gastrointestinal hemorrhage, unspecified: Secondary | ICD-10-CM | POA: Diagnosis not present

## 2018-03-08 DIAGNOSIS — I5020 Unspecified systolic (congestive) heart failure: Secondary | ICD-10-CM | POA: Diagnosis not present

## 2018-03-11 DIAGNOSIS — D649 Anemia, unspecified: Secondary | ICD-10-CM | POA: Diagnosis not present

## 2018-03-11 DIAGNOSIS — K922 Gastrointestinal hemorrhage, unspecified: Secondary | ICD-10-CM | POA: Diagnosis not present

## 2018-03-11 DIAGNOSIS — K921 Melena: Secondary | ICD-10-CM | POA: Diagnosis not present

## 2018-03-11 DIAGNOSIS — I517 Cardiomegaly: Secondary | ICD-10-CM | POA: Diagnosis not present

## 2018-03-13 DIAGNOSIS — E039 Hypothyroidism, unspecified: Secondary | ICD-10-CM | POA: Diagnosis not present

## 2018-03-13 DIAGNOSIS — E860 Dehydration: Secondary | ICD-10-CM | POA: Diagnosis not present

## 2018-03-13 DIAGNOSIS — Z87891 Personal history of nicotine dependence: Secondary | ICD-10-CM | POA: Diagnosis not present

## 2018-03-13 DIAGNOSIS — I5022 Chronic systolic (congestive) heart failure: Secondary | ICD-10-CM | POA: Diagnosis not present

## 2018-03-13 DIAGNOSIS — Z91041 Radiographic dye allergy status: Secondary | ICD-10-CM | POA: Diagnosis not present

## 2018-03-13 DIAGNOSIS — R531 Weakness: Secondary | ICD-10-CM | POA: Diagnosis not present

## 2018-03-13 DIAGNOSIS — I69928 Other speech and language deficits following unspecified cerebrovascular disease: Secondary | ICD-10-CM | POA: Diagnosis not present

## 2018-03-13 DIAGNOSIS — Z79899 Other long term (current) drug therapy: Secondary | ICD-10-CM | POA: Diagnosis not present

## 2018-03-13 DIAGNOSIS — D62 Acute posthemorrhagic anemia: Secondary | ICD-10-CM | POA: Diagnosis not present

## 2018-03-13 DIAGNOSIS — I110 Hypertensive heart disease with heart failure: Secondary | ICD-10-CM | POA: Diagnosis not present

## 2018-03-13 DIAGNOSIS — I2510 Atherosclerotic heart disease of native coronary artery without angina pectoris: Secondary | ICD-10-CM | POA: Diagnosis not present

## 2018-03-13 DIAGNOSIS — K921 Melena: Secondary | ICD-10-CM | POA: Diagnosis not present

## 2018-03-13 DIAGNOSIS — Z853 Personal history of malignant neoplasm of breast: Secondary | ICD-10-CM | POA: Diagnosis not present

## 2018-03-13 DIAGNOSIS — D649 Anemia, unspecified: Secondary | ICD-10-CM | POA: Diagnosis not present

## 2018-03-13 DIAGNOSIS — N179 Acute kidney failure, unspecified: Secondary | ICD-10-CM | POA: Diagnosis not present

## 2018-03-13 DIAGNOSIS — K264 Chronic or unspecified duodenal ulcer with hemorrhage: Secondary | ICD-10-CM | POA: Diagnosis not present

## 2018-03-13 DIAGNOSIS — Z951 Presence of aortocoronary bypass graft: Secondary | ICD-10-CM | POA: Diagnosis not present

## 2018-03-13 DIAGNOSIS — E871 Hypo-osmolality and hyponatremia: Secondary | ICD-10-CM | POA: Diagnosis not present

## 2018-03-13 DIAGNOSIS — Z8711 Personal history of peptic ulcer disease: Secondary | ICD-10-CM | POA: Diagnosis not present

## 2018-03-13 DIAGNOSIS — Z955 Presence of coronary angioplasty implant and graft: Secondary | ICD-10-CM | POA: Diagnosis not present

## 2018-03-13 DIAGNOSIS — K922 Gastrointestinal hemorrhage, unspecified: Secondary | ICD-10-CM | POA: Diagnosis not present

## 2018-03-13 DIAGNOSIS — K269 Duodenal ulcer, unspecified as acute or chronic, without hemorrhage or perforation: Secondary | ICD-10-CM | POA: Diagnosis not present

## 2018-03-14 DIAGNOSIS — K269 Duodenal ulcer, unspecified as acute or chronic, without hemorrhage or perforation: Secondary | ICD-10-CM | POA: Diagnosis not present

## 2018-03-14 DIAGNOSIS — N179 Acute kidney failure, unspecified: Secondary | ICD-10-CM | POA: Diagnosis not present

## 2018-03-14 DIAGNOSIS — D649 Anemia, unspecified: Secondary | ICD-10-CM | POA: Diagnosis not present

## 2018-03-14 DIAGNOSIS — K922 Gastrointestinal hemorrhage, unspecified: Secondary | ICD-10-CM | POA: Diagnosis not present

## 2018-03-15 DIAGNOSIS — K269 Duodenal ulcer, unspecified as acute or chronic, without hemorrhage or perforation: Secondary | ICD-10-CM | POA: Diagnosis not present

## 2018-03-15 DIAGNOSIS — K922 Gastrointestinal hemorrhage, unspecified: Secondary | ICD-10-CM | POA: Diagnosis not present

## 2018-03-15 DIAGNOSIS — N179 Acute kidney failure, unspecified: Secondary | ICD-10-CM | POA: Diagnosis not present

## 2018-03-15 DIAGNOSIS — D649 Anemia, unspecified: Secondary | ICD-10-CM | POA: Diagnosis not present

## 2018-03-16 DIAGNOSIS — K922 Gastrointestinal hemorrhage, unspecified: Secondary | ICD-10-CM | POA: Diagnosis not present

## 2018-03-16 DIAGNOSIS — D649 Anemia, unspecified: Secondary | ICD-10-CM | POA: Diagnosis not present

## 2018-03-17 DIAGNOSIS — K922 Gastrointestinal hemorrhage, unspecified: Secondary | ICD-10-CM | POA: Diagnosis not present

## 2018-03-17 DIAGNOSIS — D649 Anemia, unspecified: Secondary | ICD-10-CM | POA: Diagnosis not present

## 2018-03-22 DIAGNOSIS — D649 Anemia, unspecified: Secondary | ICD-10-CM | POA: Diagnosis not present

## 2018-03-28 DIAGNOSIS — K922 Gastrointestinal hemorrhage, unspecified: Secondary | ICD-10-CM | POA: Diagnosis not present

## 2018-03-28 DIAGNOSIS — D62 Acute posthemorrhagic anemia: Secondary | ICD-10-CM | POA: Diagnosis not present

## 2018-03-28 DIAGNOSIS — Z139 Encounter for screening, unspecified: Secondary | ICD-10-CM | POA: Diagnosis not present

## 2018-04-04 DIAGNOSIS — D62 Acute posthemorrhagic anemia: Secondary | ICD-10-CM | POA: Diagnosis not present

## 2018-04-11 DIAGNOSIS — D649 Anemia, unspecified: Secondary | ICD-10-CM | POA: Diagnosis not present

## 2018-04-20 DIAGNOSIS — I517 Cardiomegaly: Secondary | ICD-10-CM | POA: Diagnosis not present

## 2018-04-20 DIAGNOSIS — R195 Other fecal abnormalities: Secondary | ICD-10-CM | POA: Diagnosis not present

## 2018-05-04 DIAGNOSIS — R7989 Other specified abnormal findings of blood chemistry: Secondary | ICD-10-CM | POA: Diagnosis not present

## 2018-06-07 ENCOUNTER — Other Ambulatory Visit: Payer: Self-pay

## 2018-06-07 NOTE — Patient Outreach (Signed)
[facility]) Care Management  06/07/2018  KYTZIA GIENGER December 29, 1929 [id number]   Medication Adherence call to Mrs. Laurieann Friddle patient has a disconnected Number Mrs. Edling is showing past due under [place].   [place] Management Direct Dial [phone]  Fax [phone] [email or web address]

## 2018-07-04 DIAGNOSIS — Z170 Estrogen receptor positive status [ER+]: Secondary | ICD-10-CM

## 2018-07-04 DIAGNOSIS — Z79811 Long term (current) use of aromatase inhibitors: Secondary | ICD-10-CM | POA: Diagnosis not present

## 2018-07-04 DIAGNOSIS — M810 Age-related osteoporosis without current pathological fracture: Secondary | ICD-10-CM | POA: Diagnosis not present

## 2018-07-04 DIAGNOSIS — Z853 Personal history of malignant neoplasm of breast: Secondary | ICD-10-CM | POA: Diagnosis not present

## 2018-07-04 DIAGNOSIS — C50411 Malignant neoplasm of upper-outer quadrant of right female breast: Secondary | ICD-10-CM

## 2018-07-21 DIAGNOSIS — I255 Ischemic cardiomyopathy: Secondary | ICD-10-CM | POA: Diagnosis not present

## 2018-07-21 DIAGNOSIS — E782 Mixed hyperlipidemia: Secondary | ICD-10-CM | POA: Diagnosis not present

## 2018-07-21 DIAGNOSIS — Z9889 Other specified postprocedural states: Secondary | ICD-10-CM | POA: Diagnosis not present

## 2018-07-21 DIAGNOSIS — Z951 Presence of aortocoronary bypass graft: Secondary | ICD-10-CM | POA: Diagnosis not present

## 2018-07-21 DIAGNOSIS — I2510 Atherosclerotic heart disease of native coronary artery without angina pectoris: Secondary | ICD-10-CM | POA: Diagnosis not present

## 2018-07-26 DIAGNOSIS — Z853 Personal history of malignant neoplasm of breast: Secondary | ICD-10-CM | POA: Diagnosis not present

## 2018-07-26 DIAGNOSIS — Z6822 Body mass index (BMI) 22.0-22.9, adult: Secondary | ICD-10-CM | POA: Diagnosis not present

## 2018-08-02 DIAGNOSIS — E785 Hyperlipidemia, unspecified: Secondary | ICD-10-CM | POA: Diagnosis not present

## 2018-08-02 DIAGNOSIS — Z9181 History of falling: Secondary | ICD-10-CM | POA: Diagnosis not present

## 2018-08-02 DIAGNOSIS — Z1231 Encounter for screening mammogram for malignant neoplasm of breast: Secondary | ICD-10-CM | POA: Diagnosis not present

## 2018-08-02 DIAGNOSIS — Z0000 Encounter for general adult medical examination without abnormal findings: Secondary | ICD-10-CM | POA: Diagnosis not present

## 2018-09-01 DIAGNOSIS — D62 Acute posthemorrhagic anemia: Secondary | ICD-10-CM | POA: Diagnosis not present

## 2018-09-01 DIAGNOSIS — E039 Hypothyroidism, unspecified: Secondary | ICD-10-CM | POA: Diagnosis not present

## 2018-09-01 DIAGNOSIS — I2510 Atherosclerotic heart disease of native coronary artery without angina pectoris: Secondary | ICD-10-CM | POA: Diagnosis not present

## 2018-09-01 DIAGNOSIS — Z955 Presence of coronary angioplasty implant and graft: Secondary | ICD-10-CM | POA: Diagnosis not present

## 2018-09-01 DIAGNOSIS — I6990 Unspecified sequelae of unspecified cerebrovascular disease: Secondary | ICD-10-CM | POA: Diagnosis not present

## 2018-09-01 DIAGNOSIS — E785 Hyperlipidemia, unspecified: Secondary | ICD-10-CM | POA: Diagnosis not present

## 2018-09-05 DIAGNOSIS — R921 Mammographic calcification found on diagnostic imaging of breast: Secondary | ICD-10-CM | POA: Diagnosis not present

## 2018-09-05 DIAGNOSIS — C50411 Malignant neoplasm of upper-outer quadrant of right female breast: Secondary | ICD-10-CM | POA: Diagnosis not present

## 2018-09-14 DIAGNOSIS — R921 Mammographic calcification found on diagnostic imaging of breast: Secondary | ICD-10-CM | POA: Diagnosis not present

## 2018-09-15 DIAGNOSIS — R921 Mammographic calcification found on diagnostic imaging of breast: Secondary | ICD-10-CM | POA: Diagnosis not present

## 2018-09-15 DIAGNOSIS — N642 Atrophy of breast: Secondary | ICD-10-CM | POA: Diagnosis not present

## 2018-09-15 DIAGNOSIS — N6092 Unspecified benign mammary dysplasia of left breast: Secondary | ICD-10-CM | POA: Diagnosis not present

## 2018-09-20 DIAGNOSIS — Z79811 Long term (current) use of aromatase inhibitors: Secondary | ICD-10-CM | POA: Diagnosis not present

## 2018-09-20 DIAGNOSIS — M810 Age-related osteoporosis without current pathological fracture: Secondary | ICD-10-CM | POA: Diagnosis not present

## 2018-09-20 DIAGNOSIS — C50411 Malignant neoplasm of upper-outer quadrant of right female breast: Secondary | ICD-10-CM | POA: Diagnosis not present

## 2018-09-20 DIAGNOSIS — N6092 Unspecified benign mammary dysplasia of left breast: Secondary | ICD-10-CM | POA: Diagnosis not present

## 2018-09-20 DIAGNOSIS — Z853 Personal history of malignant neoplasm of breast: Secondary | ICD-10-CM | POA: Diagnosis not present

## 2018-09-23 DIAGNOSIS — N6092 Unspecified benign mammary dysplasia of left breast: Secondary | ICD-10-CM | POA: Diagnosis not present

## 2018-10-03 DIAGNOSIS — Z7982 Long term (current) use of aspirin: Secondary | ICD-10-CM | POA: Diagnosis not present

## 2018-10-03 DIAGNOSIS — K5909 Other constipation: Secondary | ICD-10-CM | POA: Diagnosis not present

## 2018-10-03 DIAGNOSIS — Z86718 Personal history of other venous thrombosis and embolism: Secondary | ICD-10-CM | POA: Diagnosis not present

## 2018-10-03 DIAGNOSIS — H9190 Unspecified hearing loss, unspecified ear: Secondary | ICD-10-CM | POA: Diagnosis not present

## 2018-10-03 DIAGNOSIS — Z955 Presence of coronary angioplasty implant and graft: Secondary | ICD-10-CM | POA: Diagnosis not present

## 2018-10-03 DIAGNOSIS — J449 Chronic obstructive pulmonary disease, unspecified: Secondary | ICD-10-CM | POA: Diagnosis not present

## 2018-10-03 DIAGNOSIS — I10 Essential (primary) hypertension: Secondary | ICD-10-CM | POA: Diagnosis not present

## 2018-10-03 DIAGNOSIS — Z87891 Personal history of nicotine dependence: Secondary | ICD-10-CM | POA: Diagnosis not present

## 2018-10-03 DIAGNOSIS — K219 Gastro-esophageal reflux disease without esophagitis: Secondary | ICD-10-CM | POA: Diagnosis not present

## 2018-10-03 DIAGNOSIS — Z952 Presence of prosthetic heart valve: Secondary | ICD-10-CM | POA: Diagnosis not present

## 2018-10-03 DIAGNOSIS — Z7902 Long term (current) use of antithrombotics/antiplatelets: Secondary | ICD-10-CM | POA: Diagnosis not present

## 2018-10-03 DIAGNOSIS — Z79899 Other long term (current) drug therapy: Secondary | ICD-10-CM | POA: Diagnosis not present

## 2018-10-03 DIAGNOSIS — I2510 Atherosclerotic heart disease of native coronary artery without angina pectoris: Secondary | ICD-10-CM | POA: Diagnosis not present

## 2018-10-03 DIAGNOSIS — J45909 Unspecified asthma, uncomplicated: Secondary | ICD-10-CM | POA: Diagnosis not present

## 2018-10-03 DIAGNOSIS — Z8673 Personal history of transient ischemic attack (TIA), and cerebral infarction without residual deficits: Secondary | ICD-10-CM | POA: Diagnosis not present

## 2018-10-03 DIAGNOSIS — I252 Old myocardial infarction: Secondary | ICD-10-CM | POA: Diagnosis not present

## 2018-10-03 DIAGNOSIS — N6092 Unspecified benign mammary dysplasia of left breast: Secondary | ICD-10-CM | POA: Diagnosis not present

## 2018-10-03 DIAGNOSIS — Z951 Presence of aortocoronary bypass graft: Secondary | ICD-10-CM | POA: Diagnosis not present

## 2018-10-07 DIAGNOSIS — Z09 Encounter for follow-up examination after completed treatment for conditions other than malignant neoplasm: Secondary | ICD-10-CM | POA: Diagnosis not present

## 2018-10-12 DIAGNOSIS — N6092 Unspecified benign mammary dysplasia of left breast: Secondary | ICD-10-CM | POA: Insufficient documentation

## 2018-10-12 HISTORY — DX: Unspecified benign mammary dysplasia of left breast: N60.92

## 2018-10-26 DIAGNOSIS — M8589 Other specified disorders of bone density and structure, multiple sites: Secondary | ICD-10-CM | POA: Diagnosis not present

## 2018-10-26 DIAGNOSIS — M810 Age-related osteoporosis without current pathological fracture: Secondary | ICD-10-CM | POA: Diagnosis not present

## 2018-11-03 DIAGNOSIS — Z853 Personal history of malignant neoplasm of breast: Secondary | ICD-10-CM | POA: Diagnosis not present

## 2018-11-03 DIAGNOSIS — M810 Age-related osteoporosis without current pathological fracture: Secondary | ICD-10-CM | POA: Diagnosis not present

## 2018-11-10 DIAGNOSIS — M810 Age-related osteoporosis without current pathological fracture: Secondary | ICD-10-CM | POA: Diagnosis not present

## 2018-11-10 DIAGNOSIS — Z853 Personal history of malignant neoplasm of breast: Secondary | ICD-10-CM | POA: Diagnosis not present

## 2018-11-15 DIAGNOSIS — Z09 Encounter for follow-up examination after completed treatment for conditions other than malignant neoplasm: Secondary | ICD-10-CM | POA: Diagnosis not present

## 2018-11-15 DIAGNOSIS — L723 Sebaceous cyst: Secondary | ICD-10-CM | POA: Diagnosis not present

## 2018-11-15 DIAGNOSIS — L089 Local infection of the skin and subcutaneous tissue, unspecified: Secondary | ICD-10-CM | POA: Diagnosis not present

## 2019-03-06 DIAGNOSIS — I6990 Unspecified sequelae of unspecified cerebrovascular disease: Secondary | ICD-10-CM | POA: Diagnosis not present

## 2019-03-06 DIAGNOSIS — E785 Hyperlipidemia, unspecified: Secondary | ICD-10-CM | POA: Diagnosis not present

## 2019-03-06 DIAGNOSIS — D62 Acute posthemorrhagic anemia: Secondary | ICD-10-CM | POA: Diagnosis not present

## 2019-03-06 DIAGNOSIS — E039 Hypothyroidism, unspecified: Secondary | ICD-10-CM | POA: Diagnosis not present

## 2019-03-06 DIAGNOSIS — I2510 Atherosclerotic heart disease of native coronary artery without angina pectoris: Secondary | ICD-10-CM | POA: Diagnosis not present

## 2019-03-06 DIAGNOSIS — Z955 Presence of coronary angioplasty implant and graft: Secondary | ICD-10-CM | POA: Diagnosis not present

## 2019-04-25 DIAGNOSIS — Z951 Presence of aortocoronary bypass graft: Secondary | ICD-10-CM | POA: Diagnosis not present

## 2019-04-25 DIAGNOSIS — Z9889 Other specified postprocedural states: Secondary | ICD-10-CM | POA: Diagnosis not present

## 2019-04-25 DIAGNOSIS — E782 Mixed hyperlipidemia: Secondary | ICD-10-CM | POA: Diagnosis not present

## 2019-04-25 DIAGNOSIS — I255 Ischemic cardiomyopathy: Secondary | ICD-10-CM | POA: Diagnosis not present

## 2019-04-25 DIAGNOSIS — I2510 Atherosclerotic heart disease of native coronary artery without angina pectoris: Secondary | ICD-10-CM | POA: Diagnosis not present

## 2019-05-01 DIAGNOSIS — C50411 Malignant neoplasm of upper-outer quadrant of right female breast: Secondary | ICD-10-CM | POA: Diagnosis not present

## 2019-05-01 DIAGNOSIS — M810 Age-related osteoporosis without current pathological fracture: Secondary | ICD-10-CM | POA: Diagnosis not present

## 2019-05-01 DIAGNOSIS — N6092 Unspecified benign mammary dysplasia of left breast: Secondary | ICD-10-CM | POA: Diagnosis not present

## 2019-05-01 DIAGNOSIS — Z79811 Long term (current) use of aromatase inhibitors: Secondary | ICD-10-CM | POA: Diagnosis not present

## 2019-05-01 DIAGNOSIS — Z853 Personal history of malignant neoplasm of breast: Secondary | ICD-10-CM | POA: Diagnosis not present

## 2019-08-03 DIAGNOSIS — Z961 Presence of intraocular lens: Secondary | ICD-10-CM | POA: Diagnosis not present

## 2019-08-08 DIAGNOSIS — Z139 Encounter for screening, unspecified: Secondary | ICD-10-CM | POA: Diagnosis not present

## 2019-08-08 DIAGNOSIS — Z0000 Encounter for general adult medical examination without abnormal findings: Secondary | ICD-10-CM | POA: Diagnosis not present

## 2019-08-08 DIAGNOSIS — E785 Hyperlipidemia, unspecified: Secondary | ICD-10-CM | POA: Diagnosis not present

## 2019-08-08 DIAGNOSIS — Z9181 History of falling: Secondary | ICD-10-CM | POA: Diagnosis not present

## 2019-09-04 DIAGNOSIS — I6990 Unspecified sequelae of unspecified cerebrovascular disease: Secondary | ICD-10-CM | POA: Diagnosis not present

## 2019-09-04 DIAGNOSIS — E785 Hyperlipidemia, unspecified: Secondary | ICD-10-CM | POA: Diagnosis not present

## 2019-09-04 DIAGNOSIS — I2510 Atherosclerotic heart disease of native coronary artery without angina pectoris: Secondary | ICD-10-CM | POA: Diagnosis not present

## 2019-09-04 DIAGNOSIS — Z955 Presence of coronary angioplasty implant and graft: Secondary | ICD-10-CM | POA: Diagnosis not present

## 2019-09-04 DIAGNOSIS — D62 Acute posthemorrhagic anemia: Secondary | ICD-10-CM | POA: Diagnosis not present

## 2019-09-04 DIAGNOSIS — I480 Paroxysmal atrial fibrillation: Secondary | ICD-10-CM | POA: Diagnosis not present

## 2019-09-07 DIAGNOSIS — R928 Other abnormal and inconclusive findings on diagnostic imaging of breast: Secondary | ICD-10-CM | POA: Diagnosis not present

## 2019-09-07 DIAGNOSIS — C50411 Malignant neoplasm of upper-outer quadrant of right female breast: Secondary | ICD-10-CM | POA: Diagnosis not present

## 2019-09-07 DIAGNOSIS — Z853 Personal history of malignant neoplasm of breast: Secondary | ICD-10-CM | POA: Diagnosis not present

## 2019-09-12 DIAGNOSIS — C50911 Malignant neoplasm of unspecified site of right female breast: Secondary | ICD-10-CM | POA: Diagnosis not present

## 2019-09-18 DIAGNOSIS — J019 Acute sinusitis, unspecified: Secondary | ICD-10-CM | POA: Diagnosis not present

## 2019-09-18 DIAGNOSIS — B9689 Other specified bacterial agents as the cause of diseases classified elsewhere: Secondary | ICD-10-CM | POA: Diagnosis not present

## 2019-10-03 DIAGNOSIS — I739 Peripheral vascular disease, unspecified: Secondary | ICD-10-CM | POA: Diagnosis not present

## 2019-10-03 DIAGNOSIS — M4727 Other spondylosis with radiculopathy, lumbosacral region: Secondary | ICD-10-CM | POA: Diagnosis not present

## 2019-10-05 DIAGNOSIS — I739 Peripheral vascular disease, unspecified: Secondary | ICD-10-CM | POA: Diagnosis not present

## 2019-10-05 DIAGNOSIS — M48061 Spinal stenosis, lumbar region without neurogenic claudication: Secondary | ICD-10-CM | POA: Diagnosis not present

## 2019-10-05 DIAGNOSIS — M4727 Other spondylosis with radiculopathy, lumbosacral region: Secondary | ICD-10-CM | POA: Diagnosis not present

## 2019-10-18 DIAGNOSIS — I110 Hypertensive heart disease with heart failure: Secondary | ICD-10-CM | POA: Diagnosis not present

## 2019-10-18 DIAGNOSIS — Z170 Estrogen receptor positive status [ER+]: Secondary | ICD-10-CM | POA: Diagnosis not present

## 2019-10-18 DIAGNOSIS — Z91041 Radiographic dye allergy status: Secondary | ICD-10-CM | POA: Diagnosis not present

## 2019-10-18 DIAGNOSIS — C50411 Malignant neoplasm of upper-outer quadrant of right female breast: Secondary | ICD-10-CM | POA: Diagnosis not present

## 2019-10-18 DIAGNOSIS — Z951 Presence of aortocoronary bypass graft: Secondary | ICD-10-CM | POA: Diagnosis not present

## 2019-10-18 DIAGNOSIS — R778 Other specified abnormalities of plasma proteins: Secondary | ICD-10-CM | POA: Diagnosis not present

## 2019-10-18 DIAGNOSIS — Z7982 Long term (current) use of aspirin: Secondary | ICD-10-CM | POA: Diagnosis not present

## 2019-10-18 DIAGNOSIS — I350 Nonrheumatic aortic (valve) stenosis: Secondary | ICD-10-CM | POA: Diagnosis not present

## 2019-10-18 DIAGNOSIS — Z87891 Personal history of nicotine dependence: Secondary | ICD-10-CM | POA: Diagnosis not present

## 2019-10-18 DIAGNOSIS — N179 Acute kidney failure, unspecified: Secondary | ICD-10-CM | POA: Diagnosis not present

## 2019-10-18 DIAGNOSIS — I739 Peripheral vascular disease, unspecified: Secondary | ICD-10-CM | POA: Diagnosis not present

## 2019-10-18 DIAGNOSIS — Z23 Encounter for immunization: Secondary | ICD-10-CM | POA: Diagnosis not present

## 2019-10-18 DIAGNOSIS — R9431 Abnormal electrocardiogram [ECG] [EKG]: Secondary | ICD-10-CM | POA: Diagnosis not present

## 2019-10-18 DIAGNOSIS — Z79811 Long term (current) use of aromatase inhibitors: Secondary | ICD-10-CM | POA: Diagnosis not present

## 2019-10-18 DIAGNOSIS — Z888 Allergy status to other drugs, medicaments and biological substances status: Secondary | ICD-10-CM | POA: Diagnosis not present

## 2019-10-18 DIAGNOSIS — R000 Tachycardia, unspecified: Secondary | ICD-10-CM | POA: Diagnosis not present

## 2019-10-18 DIAGNOSIS — I252 Old myocardial infarction: Secondary | ICD-10-CM | POA: Diagnosis not present

## 2019-10-18 DIAGNOSIS — Z8673 Personal history of transient ischemic attack (TIA), and cerebral infarction without residual deficits: Secondary | ICD-10-CM | POA: Diagnosis not present

## 2019-10-18 DIAGNOSIS — I2510 Atherosclerotic heart disease of native coronary artery without angina pectoris: Secondary | ICD-10-CM | POA: Diagnosis not present

## 2019-10-18 DIAGNOSIS — I340 Nonrheumatic mitral (valve) insufficiency: Secondary | ICD-10-CM | POA: Diagnosis not present

## 2019-10-18 DIAGNOSIS — R5383 Other fatigue: Secondary | ICD-10-CM | POA: Diagnosis not present

## 2019-10-18 DIAGNOSIS — R0602 Shortness of breath: Secondary | ICD-10-CM | POA: Diagnosis not present

## 2019-10-18 DIAGNOSIS — R0989 Other specified symptoms and signs involving the circulatory and respiratory systems: Secondary | ICD-10-CM | POA: Diagnosis not present

## 2019-10-18 DIAGNOSIS — I361 Nonrheumatic tricuspid (valve) insufficiency: Secondary | ICD-10-CM | POA: Diagnosis not present

## 2019-10-18 DIAGNOSIS — I5042 Chronic combined systolic (congestive) and diastolic (congestive) heart failure: Secondary | ICD-10-CM | POA: Diagnosis not present

## 2019-10-18 DIAGNOSIS — Z79899 Other long term (current) drug therapy: Secondary | ICD-10-CM | POA: Diagnosis not present

## 2019-10-18 DIAGNOSIS — E039 Hypothyroidism, unspecified: Secondary | ICD-10-CM | POA: Diagnosis not present

## 2019-10-18 DIAGNOSIS — E860 Dehydration: Secondary | ICD-10-CM | POA: Diagnosis not present

## 2019-10-18 DIAGNOSIS — R002 Palpitations: Secondary | ICD-10-CM | POA: Diagnosis not present

## 2019-10-18 DIAGNOSIS — I493 Ventricular premature depolarization: Secondary | ICD-10-CM | POA: Diagnosis not present

## 2019-10-18 DIAGNOSIS — I517 Cardiomegaly: Secondary | ICD-10-CM | POA: Diagnosis not present

## 2019-10-19 DIAGNOSIS — I5042 Chronic combined systolic (congestive) and diastolic (congestive) heart failure: Secondary | ICD-10-CM | POA: Diagnosis not present

## 2019-10-19 DIAGNOSIS — E860 Dehydration: Secondary | ICD-10-CM | POA: Diagnosis not present

## 2019-10-19 DIAGNOSIS — R0602 Shortness of breath: Secondary | ICD-10-CM | POA: Diagnosis not present

## 2019-10-19 DIAGNOSIS — R002 Palpitations: Secondary | ICD-10-CM | POA: Diagnosis not present

## 2019-10-19 DIAGNOSIS — I493 Ventricular premature depolarization: Secondary | ICD-10-CM | POA: Diagnosis not present

## 2019-10-19 DIAGNOSIS — N179 Acute kidney failure, unspecified: Secondary | ICD-10-CM | POA: Diagnosis not present

## 2019-10-19 DIAGNOSIS — R778 Other specified abnormalities of plasma proteins: Secondary | ICD-10-CM | POA: Diagnosis not present

## 2019-10-20 DIAGNOSIS — E860 Dehydration: Secondary | ICD-10-CM | POA: Diagnosis not present

## 2019-10-20 DIAGNOSIS — I5042 Chronic combined systolic (congestive) and diastolic (congestive) heart failure: Secondary | ICD-10-CM | POA: Diagnosis not present

## 2019-10-20 DIAGNOSIS — N179 Acute kidney failure, unspecified: Secondary | ICD-10-CM | POA: Diagnosis not present

## 2019-10-22 DIAGNOSIS — Z951 Presence of aortocoronary bypass graft: Secondary | ICD-10-CM | POA: Diagnosis not present

## 2019-10-22 DIAGNOSIS — M1991 Primary osteoarthritis, unspecified site: Secondary | ICD-10-CM | POA: Diagnosis not present

## 2019-10-22 DIAGNOSIS — R0602 Shortness of breath: Secondary | ICD-10-CM | POA: Diagnosis not present

## 2019-10-22 DIAGNOSIS — E785 Hyperlipidemia, unspecified: Secondary | ICD-10-CM | POA: Diagnosis not present

## 2019-10-22 DIAGNOSIS — N179 Acute kidney failure, unspecified: Secondary | ICD-10-CM | POA: Diagnosis not present

## 2019-10-22 DIAGNOSIS — R2689 Other abnormalities of gait and mobility: Secondary | ICD-10-CM | POA: Diagnosis not present

## 2019-10-22 DIAGNOSIS — R2681 Unsteadiness on feet: Secondary | ICD-10-CM | POA: Diagnosis not present

## 2019-10-22 DIAGNOSIS — I5042 Chronic combined systolic (congestive) and diastolic (congestive) heart failure: Secondary | ICD-10-CM | POA: Diagnosis not present

## 2019-10-22 DIAGNOSIS — I739 Peripheral vascular disease, unspecified: Secondary | ICD-10-CM | POA: Diagnosis not present

## 2019-10-22 DIAGNOSIS — Z87891 Personal history of nicotine dependence: Secondary | ICD-10-CM | POA: Diagnosis not present

## 2019-10-22 DIAGNOSIS — R000 Tachycardia, unspecified: Secondary | ICD-10-CM | POA: Diagnosis not present

## 2019-10-22 DIAGNOSIS — I2510 Atherosclerotic heart disease of native coronary artery without angina pectoris: Secondary | ICD-10-CM | POA: Diagnosis not present

## 2019-10-22 DIAGNOSIS — I110 Hypertensive heart disease with heart failure: Secondary | ICD-10-CM | POA: Diagnosis not present

## 2019-10-22 DIAGNOSIS — Z79811 Long term (current) use of aromatase inhibitors: Secondary | ICD-10-CM | POA: Diagnosis not present

## 2019-10-22 DIAGNOSIS — Z79899 Other long term (current) drug therapy: Secondary | ICD-10-CM | POA: Diagnosis not present

## 2019-10-22 DIAGNOSIS — R5383 Other fatigue: Secondary | ICD-10-CM | POA: Diagnosis not present

## 2019-10-22 DIAGNOSIS — R9431 Abnormal electrocardiogram [ECG] [EKG]: Secondary | ICD-10-CM | POA: Diagnosis not present

## 2019-10-22 DIAGNOSIS — C50411 Malignant neoplasm of upper-outer quadrant of right female breast: Secondary | ICD-10-CM | POA: Diagnosis not present

## 2019-10-22 DIAGNOSIS — I350 Nonrheumatic aortic (valve) stenosis: Secondary | ICD-10-CM | POA: Diagnosis not present

## 2019-10-22 DIAGNOSIS — R778 Other specified abnormalities of plasma proteins: Secondary | ICD-10-CM | POA: Diagnosis not present

## 2019-10-22 DIAGNOSIS — Z7902 Long term (current) use of antithrombotics/antiplatelets: Secondary | ICD-10-CM | POA: Diagnosis not present

## 2019-10-22 DIAGNOSIS — Z79891 Long term (current) use of opiate analgesic: Secondary | ICD-10-CM | POA: Diagnosis not present

## 2019-10-22 DIAGNOSIS — Z7982 Long term (current) use of aspirin: Secondary | ICD-10-CM | POA: Diagnosis not present

## 2019-10-23 DIAGNOSIS — R9431 Abnormal electrocardiogram [ECG] [EKG]: Secondary | ICD-10-CM | POA: Diagnosis not present

## 2019-10-23 DIAGNOSIS — N179 Acute kidney failure, unspecified: Secondary | ICD-10-CM | POA: Diagnosis not present

## 2019-10-23 DIAGNOSIS — R000 Tachycardia, unspecified: Secondary | ICD-10-CM | POA: Diagnosis not present

## 2019-10-23 DIAGNOSIS — R778 Other specified abnormalities of plasma proteins: Secondary | ICD-10-CM | POA: Diagnosis not present

## 2019-10-26 DIAGNOSIS — I5042 Chronic combined systolic (congestive) and diastolic (congestive) heart failure: Secondary | ICD-10-CM | POA: Diagnosis not present

## 2019-10-26 DIAGNOSIS — I739 Peripheral vascular disease, unspecified: Secondary | ICD-10-CM | POA: Diagnosis not present

## 2019-10-26 DIAGNOSIS — R9431 Abnormal electrocardiogram [ECG] [EKG]: Secondary | ICD-10-CM | POA: Diagnosis not present

## 2019-10-26 DIAGNOSIS — E785 Hyperlipidemia, unspecified: Secondary | ICD-10-CM | POA: Diagnosis not present

## 2019-10-26 DIAGNOSIS — Z79811 Long term (current) use of aromatase inhibitors: Secondary | ICD-10-CM | POA: Diagnosis not present

## 2019-10-26 DIAGNOSIS — I110 Hypertensive heart disease with heart failure: Secondary | ICD-10-CM | POA: Diagnosis not present

## 2019-10-26 DIAGNOSIS — R0602 Shortness of breath: Secondary | ICD-10-CM | POA: Diagnosis not present

## 2019-10-26 DIAGNOSIS — Z79891 Long term (current) use of opiate analgesic: Secondary | ICD-10-CM | POA: Diagnosis not present

## 2019-10-26 DIAGNOSIS — M1991 Primary osteoarthritis, unspecified site: Secondary | ICD-10-CM | POA: Diagnosis not present

## 2019-10-26 DIAGNOSIS — N179 Acute kidney failure, unspecified: Secondary | ICD-10-CM | POA: Diagnosis not present

## 2019-10-26 DIAGNOSIS — Z7982 Long term (current) use of aspirin: Secondary | ICD-10-CM | POA: Diagnosis not present

## 2019-10-26 DIAGNOSIS — R5383 Other fatigue: Secondary | ICD-10-CM | POA: Diagnosis not present

## 2019-10-26 DIAGNOSIS — I2510 Atherosclerotic heart disease of native coronary artery without angina pectoris: Secondary | ICD-10-CM | POA: Diagnosis not present

## 2019-10-26 DIAGNOSIS — Z951 Presence of aortocoronary bypass graft: Secondary | ICD-10-CM | POA: Diagnosis not present

## 2019-10-26 DIAGNOSIS — C50411 Malignant neoplasm of upper-outer quadrant of right female breast: Secondary | ICD-10-CM | POA: Diagnosis not present

## 2019-10-26 DIAGNOSIS — R2689 Other abnormalities of gait and mobility: Secondary | ICD-10-CM | POA: Diagnosis not present

## 2019-10-26 DIAGNOSIS — Z87891 Personal history of nicotine dependence: Secondary | ICD-10-CM | POA: Diagnosis not present

## 2019-10-26 DIAGNOSIS — I350 Nonrheumatic aortic (valve) stenosis: Secondary | ICD-10-CM | POA: Diagnosis not present

## 2019-10-26 DIAGNOSIS — I255 Ischemic cardiomyopathy: Secondary | ICD-10-CM | POA: Diagnosis not present

## 2019-10-26 DIAGNOSIS — R2681 Unsteadiness on feet: Secondary | ICD-10-CM | POA: Diagnosis not present

## 2019-10-26 DIAGNOSIS — R778 Other specified abnormalities of plasma proteins: Secondary | ICD-10-CM | POA: Diagnosis not present

## 2019-10-26 DIAGNOSIS — Z7902 Long term (current) use of antithrombotics/antiplatelets: Secondary | ICD-10-CM | POA: Diagnosis not present

## 2019-10-26 DIAGNOSIS — Z79899 Other long term (current) drug therapy: Secondary | ICD-10-CM | POA: Diagnosis not present

## 2019-10-26 DIAGNOSIS — R000 Tachycardia, unspecified: Secondary | ICD-10-CM | POA: Diagnosis not present

## 2019-10-26 DIAGNOSIS — Z9889 Other specified postprocedural states: Secondary | ICD-10-CM | POA: Diagnosis not present

## 2019-10-26 DIAGNOSIS — E782 Mixed hyperlipidemia: Secondary | ICD-10-CM | POA: Diagnosis not present

## 2019-10-29 ENCOUNTER — Encounter: Payer: Self-pay | Admitting: Pharmacist

## 2019-10-29 DIAGNOSIS — M810 Age-related osteoporosis without current pathological fracture: Secondary | ICD-10-CM

## 2019-10-29 HISTORY — DX: Age-related osteoporosis without current pathological fracture: M81.0

## 2019-11-02 DIAGNOSIS — I350 Nonrheumatic aortic (valve) stenosis: Secondary | ICD-10-CM | POA: Diagnosis not present

## 2019-11-02 DIAGNOSIS — Z79899 Other long term (current) drug therapy: Secondary | ICD-10-CM | POA: Diagnosis not present

## 2019-11-02 DIAGNOSIS — M1991 Primary osteoarthritis, unspecified site: Secondary | ICD-10-CM | POA: Diagnosis not present

## 2019-11-02 DIAGNOSIS — I5042 Chronic combined systolic (congestive) and diastolic (congestive) heart failure: Secondary | ICD-10-CM | POA: Diagnosis not present

## 2019-11-02 DIAGNOSIS — N179 Acute kidney failure, unspecified: Secondary | ICD-10-CM | POA: Diagnosis not present

## 2019-11-02 DIAGNOSIS — Z7902 Long term (current) use of antithrombotics/antiplatelets: Secondary | ICD-10-CM | POA: Diagnosis not present

## 2019-11-02 DIAGNOSIS — Z87891 Personal history of nicotine dependence: Secondary | ICD-10-CM | POA: Diagnosis not present

## 2019-11-02 DIAGNOSIS — Z79891 Long term (current) use of opiate analgesic: Secondary | ICD-10-CM | POA: Diagnosis not present

## 2019-11-02 DIAGNOSIS — R778 Other specified abnormalities of plasma proteins: Secondary | ICD-10-CM | POA: Diagnosis not present

## 2019-11-02 DIAGNOSIS — R0602 Shortness of breath: Secondary | ICD-10-CM | POA: Diagnosis not present

## 2019-11-02 DIAGNOSIS — I739 Peripheral vascular disease, unspecified: Secondary | ICD-10-CM | POA: Diagnosis not present

## 2019-11-02 DIAGNOSIS — I110 Hypertensive heart disease with heart failure: Secondary | ICD-10-CM | POA: Diagnosis not present

## 2019-11-02 DIAGNOSIS — E785 Hyperlipidemia, unspecified: Secondary | ICD-10-CM | POA: Diagnosis not present

## 2019-11-02 DIAGNOSIS — Z7982 Long term (current) use of aspirin: Secondary | ICD-10-CM | POA: Diagnosis not present

## 2019-11-02 DIAGNOSIS — R2689 Other abnormalities of gait and mobility: Secondary | ICD-10-CM | POA: Diagnosis not present

## 2019-11-02 DIAGNOSIS — Z951 Presence of aortocoronary bypass graft: Secondary | ICD-10-CM | POA: Diagnosis not present

## 2019-11-02 DIAGNOSIS — R000 Tachycardia, unspecified: Secondary | ICD-10-CM | POA: Diagnosis not present

## 2019-11-02 DIAGNOSIS — I2510 Atherosclerotic heart disease of native coronary artery without angina pectoris: Secondary | ICD-10-CM | POA: Diagnosis not present

## 2019-11-02 DIAGNOSIS — Z79811 Long term (current) use of aromatase inhibitors: Secondary | ICD-10-CM | POA: Diagnosis not present

## 2019-11-02 DIAGNOSIS — C50411 Malignant neoplasm of upper-outer quadrant of right female breast: Secondary | ICD-10-CM | POA: Diagnosis not present

## 2019-11-02 DIAGNOSIS — R9431 Abnormal electrocardiogram [ECG] [EKG]: Secondary | ICD-10-CM | POA: Diagnosis not present

## 2019-11-02 DIAGNOSIS — R5383 Other fatigue: Secondary | ICD-10-CM | POA: Diagnosis not present

## 2019-11-02 DIAGNOSIS — R2681 Unsteadiness on feet: Secondary | ICD-10-CM | POA: Diagnosis not present

## 2019-11-04 DIAGNOSIS — N200 Calculus of kidney: Secondary | ICD-10-CM | POA: Diagnosis not present

## 2019-11-04 DIAGNOSIS — R0781 Pleurodynia: Secondary | ICD-10-CM | POA: Diagnosis not present

## 2019-11-04 DIAGNOSIS — I509 Heart failure, unspecified: Secondary | ICD-10-CM | POA: Diagnosis not present

## 2019-11-04 DIAGNOSIS — N201 Calculus of ureter: Secondary | ICD-10-CM | POA: Diagnosis not present

## 2019-11-04 DIAGNOSIS — I110 Hypertensive heart disease with heart failure: Secondary | ICD-10-CM | POA: Diagnosis not present

## 2019-11-04 DIAGNOSIS — N1330 Unspecified hydronephrosis: Secondary | ICD-10-CM | POA: Diagnosis not present

## 2019-11-04 DIAGNOSIS — I350 Nonrheumatic aortic (valve) stenosis: Secondary | ICD-10-CM | POA: Diagnosis not present

## 2019-11-04 DIAGNOSIS — Z853 Personal history of malignant neoplasm of breast: Secondary | ICD-10-CM | POA: Diagnosis not present

## 2019-11-04 DIAGNOSIS — S2231XA Fracture of one rib, right side, initial encounter for closed fracture: Secondary | ICD-10-CM | POA: Diagnosis not present

## 2019-11-04 DIAGNOSIS — J90 Pleural effusion, not elsewhere classified: Secondary | ICD-10-CM | POA: Diagnosis not present

## 2019-11-04 DIAGNOSIS — E039 Hypothyroidism, unspecified: Secondary | ICD-10-CM | POA: Diagnosis not present

## 2019-11-04 DIAGNOSIS — N289 Disorder of kidney and ureter, unspecified: Secondary | ICD-10-CM | POA: Diagnosis not present

## 2019-11-08 DIAGNOSIS — R1031 Right lower quadrant pain: Secondary | ICD-10-CM | POA: Diagnosis not present

## 2019-11-08 DIAGNOSIS — N200 Calculus of kidney: Secondary | ICD-10-CM | POA: Diagnosis not present

## 2019-11-09 DIAGNOSIS — I2510 Atherosclerotic heart disease of native coronary artery without angina pectoris: Secondary | ICD-10-CM | POA: Diagnosis not present

## 2019-11-09 DIAGNOSIS — Z79891 Long term (current) use of opiate analgesic: Secondary | ICD-10-CM | POA: Diagnosis not present

## 2019-11-09 DIAGNOSIS — Z7902 Long term (current) use of antithrombotics/antiplatelets: Secondary | ICD-10-CM | POA: Diagnosis not present

## 2019-11-09 DIAGNOSIS — R000 Tachycardia, unspecified: Secondary | ICD-10-CM | POA: Diagnosis not present

## 2019-11-09 DIAGNOSIS — I739 Peripheral vascular disease, unspecified: Secondary | ICD-10-CM | POA: Diagnosis not present

## 2019-11-09 DIAGNOSIS — N179 Acute kidney failure, unspecified: Secondary | ICD-10-CM | POA: Diagnosis not present

## 2019-11-09 DIAGNOSIS — I350 Nonrheumatic aortic (valve) stenosis: Secondary | ICD-10-CM | POA: Diagnosis not present

## 2019-11-09 DIAGNOSIS — I5032 Chronic diastolic (congestive) heart failure: Secondary | ICD-10-CM | POA: Diagnosis not present

## 2019-11-09 DIAGNOSIS — Z79811 Long term (current) use of aromatase inhibitors: Secondary | ICD-10-CM | POA: Diagnosis not present

## 2019-11-09 DIAGNOSIS — R5383 Other fatigue: Secondary | ICD-10-CM | POA: Diagnosis not present

## 2019-11-09 DIAGNOSIS — R2681 Unsteadiness on feet: Secondary | ICD-10-CM | POA: Diagnosis not present

## 2019-11-09 DIAGNOSIS — I110 Hypertensive heart disease with heart failure: Secondary | ICD-10-CM | POA: Diagnosis not present

## 2019-11-09 DIAGNOSIS — Z7982 Long term (current) use of aspirin: Secondary | ICD-10-CM | POA: Diagnosis not present

## 2019-11-09 DIAGNOSIS — M1991 Primary osteoarthritis, unspecified site: Secondary | ICD-10-CM | POA: Diagnosis not present

## 2019-11-09 DIAGNOSIS — Z79899 Other long term (current) drug therapy: Secondary | ICD-10-CM | POA: Diagnosis not present

## 2019-11-09 DIAGNOSIS — R0602 Shortness of breath: Secondary | ICD-10-CM | POA: Diagnosis not present

## 2019-11-09 DIAGNOSIS — R9431 Abnormal electrocardiogram [ECG] [EKG]: Secondary | ICD-10-CM | POA: Diagnosis not present

## 2019-11-09 DIAGNOSIS — I5023 Acute on chronic systolic (congestive) heart failure: Secondary | ICD-10-CM | POA: Diagnosis not present

## 2019-11-09 DIAGNOSIS — Z951 Presence of aortocoronary bypass graft: Secondary | ICD-10-CM | POA: Diagnosis not present

## 2019-11-09 DIAGNOSIS — R778 Other specified abnormalities of plasma proteins: Secondary | ICD-10-CM | POA: Diagnosis not present

## 2019-11-09 DIAGNOSIS — E785 Hyperlipidemia, unspecified: Secondary | ICD-10-CM | POA: Diagnosis not present

## 2019-11-09 DIAGNOSIS — Z87891 Personal history of nicotine dependence: Secondary | ICD-10-CM | POA: Diagnosis not present

## 2019-11-09 DIAGNOSIS — C50411 Malignant neoplasm of upper-outer quadrant of right female breast: Secondary | ICD-10-CM | POA: Diagnosis not present

## 2019-11-09 DIAGNOSIS — I5042 Chronic combined systolic (congestive) and diastolic (congestive) heart failure: Secondary | ICD-10-CM | POA: Diagnosis not present

## 2019-11-09 DIAGNOSIS — R2689 Other abnormalities of gait and mobility: Secondary | ICD-10-CM | POA: Diagnosis not present

## 2019-11-10 ENCOUNTER — Other Ambulatory Visit: Payer: Self-pay | Admitting: Hematology and Oncology

## 2019-11-10 ENCOUNTER — Inpatient Hospital Stay: Payer: Medicare Other

## 2019-11-10 DIAGNOSIS — M810 Age-related osteoporosis without current pathological fracture: Secondary | ICD-10-CM | POA: Diagnosis not present

## 2019-11-10 DIAGNOSIS — C50411 Malignant neoplasm of upper-outer quadrant of right female breast: Secondary | ICD-10-CM | POA: Diagnosis not present

## 2019-11-10 DIAGNOSIS — Z853 Personal history of malignant neoplasm of breast: Secondary | ICD-10-CM | POA: Diagnosis not present

## 2019-11-10 DIAGNOSIS — N189 Chronic kidney disease, unspecified: Secondary | ICD-10-CM | POA: Diagnosis not present

## 2019-11-10 LAB — HEPATIC FUNCTION PANEL
ALT: 12 (ref 7–35)
AST: 36 — AB (ref 13–35)
Alkaline Phosphatase: 52 (ref 25–125)
Bilirubin, Total: 0.6

## 2019-11-10 LAB — BASIC METABOLIC PANEL
BUN: 32 — AB (ref 4–21)
CO2: 21 (ref 13–22)
Chloride: 99 (ref 99–108)
Creatinine: 1.7 — AB (ref 0.5–1.1)
Glucose: 88
Potassium: 5 (ref 3.4–5.3)
Sodium: 133 — AB (ref 137–147)

## 2019-11-10 LAB — COMPREHENSIVE METABOLIC PANEL
Albumin: 4.3 (ref 3.5–5.0)
Calcium: 10 (ref 8.7–10.7)

## 2019-11-10 LAB — CBC AND DIFFERENTIAL
HCT: 41 (ref 36–46)
Hemoglobin: 13.4 (ref 12.0–16.0)
Neutrophils Absolute: 6830
Platelets: 329 (ref 150–399)
WBC: 9.9

## 2019-11-10 LAB — CBC: RBC: 4.27 (ref 3.87–5.11)

## 2019-11-13 ENCOUNTER — Inpatient Hospital Stay: Payer: Medicare Other | Attending: Oncology

## 2019-11-13 ENCOUNTER — Other Ambulatory Visit: Payer: Self-pay

## 2019-11-13 ENCOUNTER — Telehealth: Payer: Self-pay | Admitting: Oncology

## 2019-11-13 VITALS — BP 134/61 | HR 92 | Temp 98.4°F | Ht 62.0 in | Wt 126.8 lb

## 2019-11-13 DIAGNOSIS — Z1159 Encounter for screening for other viral diseases: Secondary | ICD-10-CM | POA: Diagnosis not present

## 2019-11-13 DIAGNOSIS — M810 Age-related osteoporosis without current pathological fracture: Secondary | ICD-10-CM | POA: Diagnosis not present

## 2019-11-13 DIAGNOSIS — Z1152 Encounter for screening for COVID-19: Secondary | ICD-10-CM | POA: Diagnosis not present

## 2019-11-13 MED ORDER — DENOSUMAB 60 MG/ML ~~LOC~~ SOSY
60.0000 mg | PREFILLED_SYRINGE | Freq: Once | SUBCUTANEOUS | Status: AC
  Administered 2019-11-13: 60 mg via SUBCUTANEOUS

## 2019-11-13 MED ORDER — DENOSUMAB 60 MG/ML ~~LOC~~ SOSY
PREFILLED_SYRINGE | SUBCUTANEOUS | Status: AC
  Filled 2019-11-13: qty 1

## 2019-11-13 NOTE — Telephone Encounter (Signed)
Scheduled injection appt. Gave pt a print out of appt calendar.

## 2019-11-13 NOTE — Progress Notes (Signed)
Patient stable at time of discharge. 

## 2019-11-16 DIAGNOSIS — M1991 Primary osteoarthritis, unspecified site: Secondary | ICD-10-CM | POA: Diagnosis not present

## 2019-11-16 DIAGNOSIS — R778 Other specified abnormalities of plasma proteins: Secondary | ICD-10-CM | POA: Diagnosis not present

## 2019-11-16 DIAGNOSIS — I5042 Chronic combined systolic (congestive) and diastolic (congestive) heart failure: Secondary | ICD-10-CM | POA: Diagnosis not present

## 2019-11-16 DIAGNOSIS — Z951 Presence of aortocoronary bypass graft: Secondary | ICD-10-CM | POA: Diagnosis not present

## 2019-11-16 DIAGNOSIS — Z79891 Long term (current) use of opiate analgesic: Secondary | ICD-10-CM | POA: Diagnosis not present

## 2019-11-16 DIAGNOSIS — Z79899 Other long term (current) drug therapy: Secondary | ICD-10-CM | POA: Diagnosis not present

## 2019-11-16 DIAGNOSIS — I350 Nonrheumatic aortic (valve) stenosis: Secondary | ICD-10-CM | POA: Diagnosis not present

## 2019-11-16 DIAGNOSIS — Z87891 Personal history of nicotine dependence: Secondary | ICD-10-CM | POA: Diagnosis not present

## 2019-11-16 DIAGNOSIS — Z7982 Long term (current) use of aspirin: Secondary | ICD-10-CM | POA: Diagnosis not present

## 2019-11-16 DIAGNOSIS — R0602 Shortness of breath: Secondary | ICD-10-CM | POA: Diagnosis not present

## 2019-11-16 DIAGNOSIS — Z79811 Long term (current) use of aromatase inhibitors: Secondary | ICD-10-CM | POA: Diagnosis not present

## 2019-11-16 DIAGNOSIS — Z7902 Long term (current) use of antithrombotics/antiplatelets: Secondary | ICD-10-CM | POA: Diagnosis not present

## 2019-11-16 DIAGNOSIS — I5032 Chronic diastolic (congestive) heart failure: Secondary | ICD-10-CM | POA: Diagnosis not present

## 2019-11-16 DIAGNOSIS — C50411 Malignant neoplasm of upper-outer quadrant of right female breast: Secondary | ICD-10-CM | POA: Diagnosis not present

## 2019-11-16 DIAGNOSIS — R000 Tachycardia, unspecified: Secondary | ICD-10-CM | POA: Diagnosis not present

## 2019-11-16 DIAGNOSIS — I110 Hypertensive heart disease with heart failure: Secondary | ICD-10-CM | POA: Diagnosis not present

## 2019-11-16 DIAGNOSIS — I739 Peripheral vascular disease, unspecified: Secondary | ICD-10-CM | POA: Diagnosis not present

## 2019-11-16 DIAGNOSIS — I5023 Acute on chronic systolic (congestive) heart failure: Secondary | ICD-10-CM | POA: Diagnosis not present

## 2019-11-16 DIAGNOSIS — R2689 Other abnormalities of gait and mobility: Secondary | ICD-10-CM | POA: Diagnosis not present

## 2019-11-16 DIAGNOSIS — E785 Hyperlipidemia, unspecified: Secondary | ICD-10-CM | POA: Diagnosis not present

## 2019-11-16 DIAGNOSIS — R5383 Other fatigue: Secondary | ICD-10-CM | POA: Diagnosis not present

## 2019-11-16 DIAGNOSIS — R2681 Unsteadiness on feet: Secondary | ICD-10-CM | POA: Diagnosis not present

## 2019-11-16 DIAGNOSIS — N179 Acute kidney failure, unspecified: Secondary | ICD-10-CM | POA: Diagnosis not present

## 2019-11-16 DIAGNOSIS — I2510 Atherosclerotic heart disease of native coronary artery without angina pectoris: Secondary | ICD-10-CM | POA: Diagnosis not present

## 2019-11-16 DIAGNOSIS — R9431 Abnormal electrocardiogram [ECG] [EKG]: Secondary | ICD-10-CM | POA: Diagnosis not present

## 2019-11-17 ENCOUNTER — Encounter (HOSPITAL_COMMUNITY): Payer: Self-pay | Admitting: Internal Medicine

## 2019-11-17 ENCOUNTER — Inpatient Hospital Stay (HOSPITAL_COMMUNITY): Payer: Medicare Other

## 2019-11-17 ENCOUNTER — Inpatient Hospital Stay (HOSPITAL_COMMUNITY)
Admission: AD | Admit: 2019-11-17 | Discharge: 2019-11-24 | DRG: 280 | Disposition: A | Discharge status: Home / Self Care | Payer: Medicare Other | Attending: Interventional Cardiology | Admitting: Interventional Cardiology

## 2019-11-17 ENCOUNTER — Encounter (HOSPITAL_COMMUNITY)
Admission: AD | Disposition: A | Discharge status: Home / Self Care | Payer: Self-pay | Source: Home / Self Care | Attending: Interventional Cardiology

## 2019-11-17 DIAGNOSIS — N23 Unspecified renal colic: Secondary | ICD-10-CM | POA: Diagnosis not present

## 2019-11-17 DIAGNOSIS — Z7982 Long term (current) use of aspirin: Secondary | ICD-10-CM

## 2019-11-17 DIAGNOSIS — R0603 Acute respiratory distress: Secondary | ICD-10-CM

## 2019-11-17 DIAGNOSIS — T508X5A Adverse effect of diagnostic agents, initial encounter: Secondary | ICD-10-CM | POA: Diagnosis not present

## 2019-11-17 DIAGNOSIS — I272 Pulmonary hypertension, unspecified: Secondary | ICD-10-CM | POA: Diagnosis present

## 2019-11-17 DIAGNOSIS — R0789 Other chest pain: Secondary | ICD-10-CM

## 2019-11-17 DIAGNOSIS — I739 Peripheral vascular disease, unspecified: Secondary | ICD-10-CM | POA: Diagnosis present

## 2019-11-17 DIAGNOSIS — I5031 Acute diastolic (congestive) heart failure: Secondary | ICD-10-CM | POA: Diagnosis not present

## 2019-11-17 DIAGNOSIS — N132 Hydronephrosis with renal and ureteral calculous obstruction: Secondary | ICD-10-CM | POA: Diagnosis present

## 2019-11-17 DIAGNOSIS — Z8249 Family history of ischemic heart disease and other diseases of the circulatory system: Secondary | ICD-10-CM

## 2019-11-17 DIAGNOSIS — I499 Cardiac arrhythmia, unspecified: Secondary | ICD-10-CM | POA: Diagnosis not present

## 2019-11-17 DIAGNOSIS — I5041 Acute combined systolic (congestive) and diastolic (congestive) heart failure: Secondary | ICD-10-CM | POA: Diagnosis not present

## 2019-11-17 DIAGNOSIS — I5020 Unspecified systolic (congestive) heart failure: Secondary | ICD-10-CM

## 2019-11-17 DIAGNOSIS — Z853 Personal history of malignant neoplasm of breast: Secondary | ICD-10-CM | POA: Diagnosis not present

## 2019-11-17 DIAGNOSIS — I214 Non-ST elevation (NSTEMI) myocardial infarction: Secondary | ICD-10-CM | POA: Diagnosis present

## 2019-11-17 DIAGNOSIS — N179 Acute kidney failure, unspecified: Secondary | ICD-10-CM | POA: Diagnosis not present

## 2019-11-17 DIAGNOSIS — Z6822 Body mass index (BMI) 22.0-22.9, adult: Secondary | ICD-10-CM | POA: Diagnosis not present

## 2019-11-17 DIAGNOSIS — R0902 Hypoxemia: Secondary | ICD-10-CM | POA: Diagnosis not present

## 2019-11-17 DIAGNOSIS — I700 Atherosclerosis of aorta: Secondary | ICD-10-CM | POA: Diagnosis not present

## 2019-11-17 DIAGNOSIS — N183 Chronic kidney disease, stage 3 unspecified: Secondary | ICD-10-CM | POA: Diagnosis not present

## 2019-11-17 DIAGNOSIS — T502X5A Adverse effect of carbonic-anhydrase inhibitors, benzothiadiazides and other diuretics, initial encounter: Secondary | ICD-10-CM | POA: Diagnosis not present

## 2019-11-17 DIAGNOSIS — I25810 Atherosclerosis of coronary artery bypass graft(s) without angina pectoris: Secondary | ICD-10-CM | POA: Diagnosis not present

## 2019-11-17 DIAGNOSIS — I509 Heart failure, unspecified: Secondary | ICD-10-CM | POA: Diagnosis not present

## 2019-11-17 DIAGNOSIS — I350 Nonrheumatic aortic (valve) stenosis: Secondary | ICD-10-CM | POA: Diagnosis not present

## 2019-11-17 DIAGNOSIS — I5021 Acute systolic (congestive) heart failure: Secondary | ICD-10-CM | POA: Diagnosis present

## 2019-11-17 DIAGNOSIS — I352 Nonrheumatic aortic (valve) stenosis with insufficiency: Secondary | ICD-10-CM | POA: Diagnosis not present

## 2019-11-17 DIAGNOSIS — I5043 Acute on chronic combined systolic (congestive) and diastolic (congestive) heart failure: Secondary | ICD-10-CM | POA: Diagnosis not present

## 2019-11-17 DIAGNOSIS — I493 Ventricular premature depolarization: Secondary | ICD-10-CM | POA: Diagnosis present

## 2019-11-17 DIAGNOSIS — R931 Abnormal findings on diagnostic imaging of heart and coronary circulation: Secondary | ICD-10-CM

## 2019-11-17 DIAGNOSIS — E785 Hyperlipidemia, unspecified: Secondary | ICD-10-CM | POA: Diagnosis not present

## 2019-11-17 DIAGNOSIS — Z743 Need for continuous supervision: Secondary | ICD-10-CM | POA: Diagnosis not present

## 2019-11-17 DIAGNOSIS — R778 Other specified abnormalities of plasma proteins: Secondary | ICD-10-CM | POA: Diagnosis not present

## 2019-11-17 DIAGNOSIS — R0602 Shortness of breath: Secondary | ICD-10-CM | POA: Diagnosis not present

## 2019-11-17 DIAGNOSIS — Z7902 Long term (current) use of antithrombotics/antiplatelets: Secondary | ICD-10-CM

## 2019-11-17 DIAGNOSIS — I25110 Atherosclerotic heart disease of native coronary artery with unstable angina pectoris: Principal | ICD-10-CM | POA: Diagnosis present

## 2019-11-17 DIAGNOSIS — Z91041 Radiographic dye allergy status: Secondary | ICD-10-CM | POA: Diagnosis not present

## 2019-11-17 DIAGNOSIS — J9601 Acute respiratory failure with hypoxia: Secondary | ICD-10-CM | POA: Diagnosis not present

## 2019-11-17 DIAGNOSIS — I517 Cardiomegaly: Secondary | ICD-10-CM | POA: Diagnosis not present

## 2019-11-17 DIAGNOSIS — Z888 Allergy status to other drugs, medicaments and biological substances status: Secondary | ICD-10-CM

## 2019-11-17 DIAGNOSIS — I2510 Atherosclerotic heart disease of native coronary artery without angina pectoris: Secondary | ICD-10-CM

## 2019-11-17 DIAGNOSIS — I361 Nonrheumatic tricuspid (valve) insufficiency: Secondary | ICD-10-CM | POA: Diagnosis not present

## 2019-11-17 DIAGNOSIS — E43 Unspecified severe protein-calorie malnutrition: Secondary | ICD-10-CM | POA: Diagnosis present

## 2019-11-17 DIAGNOSIS — Y929 Unspecified place or not applicable: Secondary | ICD-10-CM | POA: Diagnosis not present

## 2019-11-17 DIAGNOSIS — R21 Rash and other nonspecific skin eruption: Secondary | ICD-10-CM | POA: Diagnosis not present

## 2019-11-17 DIAGNOSIS — Z952 Presence of prosthetic heart valve: Secondary | ICD-10-CM

## 2019-11-17 DIAGNOSIS — I255 Ischemic cardiomyopathy: Secondary | ICD-10-CM | POA: Diagnosis not present

## 2019-11-17 DIAGNOSIS — R7989 Other specified abnormal findings of blood chemistry: Secondary | ICD-10-CM | POA: Diagnosis present

## 2019-11-17 DIAGNOSIS — R0600 Dyspnea, unspecified: Secondary | ICD-10-CM

## 2019-11-17 DIAGNOSIS — Z79899 Other long term (current) drug therapy: Secondary | ICD-10-CM

## 2019-11-17 DIAGNOSIS — Z8673 Personal history of transient ischemic attack (TIA), and cerebral infarction without residual deficits: Secondary | ICD-10-CM | POA: Diagnosis not present

## 2019-11-17 DIAGNOSIS — I110 Hypertensive heart disease with heart failure: Secondary | ICD-10-CM | POA: Diagnosis present

## 2019-11-17 DIAGNOSIS — J9811 Atelectasis: Secondary | ICD-10-CM | POA: Diagnosis not present

## 2019-11-17 DIAGNOSIS — I5181 Takotsubo syndrome: Secondary | ICD-10-CM | POA: Diagnosis not present

## 2019-11-17 DIAGNOSIS — N201 Calculus of ureter: Secondary | ICD-10-CM | POA: Diagnosis not present

## 2019-11-17 DIAGNOSIS — J811 Chronic pulmonary edema: Secondary | ICD-10-CM | POA: Diagnosis not present

## 2019-11-17 DIAGNOSIS — I2699 Other pulmonary embolism without acute cor pulmonale: Secondary | ICD-10-CM | POA: Diagnosis not present

## 2019-11-17 DIAGNOSIS — R059 Cough, unspecified: Secondary | ICD-10-CM | POA: Diagnosis not present

## 2019-11-17 DIAGNOSIS — K5900 Constipation, unspecified: Secondary | ICD-10-CM | POA: Diagnosis not present

## 2019-11-17 DIAGNOSIS — R6889 Other general symptoms and signs: Secondary | ICD-10-CM | POA: Diagnosis not present

## 2019-11-17 DIAGNOSIS — R069 Unspecified abnormalities of breathing: Secondary | ICD-10-CM | POA: Diagnosis not present

## 2019-11-17 HISTORY — DX: Chronic systolic (congestive) heart failure: I50.22

## 2019-11-17 HISTORY — DX: Peripheral vascular disease, unspecified: I73.9

## 2019-11-17 HISTORY — DX: Atherosclerotic heart disease of native coronary artery without angina pectoris: I25.10

## 2019-11-17 HISTORY — DX: Malignant neoplasm of unspecified site of unspecified female breast: C50.919

## 2019-11-17 HISTORY — DX: Essential (primary) hypertension: I10

## 2019-11-17 HISTORY — DX: Hyperlipidemia, unspecified: E78.5

## 2019-11-17 HISTORY — PX: RIGHT/LEFT HEART CATH AND CORONARY/GRAFT ANGIOGRAPHY: CATH118267

## 2019-11-17 LAB — POCT I-STAT 7, (LYTES, BLD GAS, ICA,H+H)
Acid-base deficit: 3 mmol/L — ABNORMAL HIGH (ref 0.0–2.0)
Bicarbonate: 22 mmol/L (ref 20.0–28.0)
Calcium, Ion: 1.19 mmol/L (ref 1.15–1.40)
HCT: 36 % (ref 36.0–46.0)
Hemoglobin: 12.2 g/dL (ref 12.0–15.0)
O2 Saturation: 87 %
Potassium: 4.8 mmol/L (ref 3.5–5.1)
Sodium: 138 mmol/L (ref 135–145)
TCO2: 23 mmol/L (ref 22–32)
pCO2 arterial: 40 mmHg (ref 32.0–48.0)
pH, Arterial: 7.348 — ABNORMAL LOW (ref 7.350–7.450)
pO2, Arterial: 55 mmHg — ABNORMAL LOW (ref 83.0–108.0)

## 2019-11-17 LAB — CREATININE, SERUM
Creatinine, Ser: 1.16 mg/dL — ABNORMAL HIGH (ref 0.44–1.00)
GFR, Estimated: 42 mL/min — ABNORMAL LOW (ref >60)

## 2019-11-17 LAB — CBC
HCT: 35.5 % — ABNORMAL LOW (ref 36.0–46.0)
Hemoglobin: 11.4 g/dL — ABNORMAL LOW (ref 12.0–15.0)
MCH: 31.2 pg (ref 26.0–34.0)
MCHC: 32.1 g/dL (ref 30.0–36.0)
MCV: 97.3 fL (ref 80.0–100.0)
Platelets: 225 10*3/uL (ref 150–400)
RBC: 3.65 MIL/uL — ABNORMAL LOW (ref 3.87–5.11)
RDW: 12.9 % (ref 11.5–15.5)
WBC: 9.1 10*3/uL (ref 4.0–10.5)
nRBC: 0 % (ref 0.0–0.2)

## 2019-11-17 LAB — POCT I-STAT EG7
Acid-base deficit: 2 mmol/L (ref 0.0–2.0)
Bicarbonate: 23.6 mmol/L (ref 20.0–28.0)
Calcium, Ion: 1.2 mmol/L (ref 1.15–1.40)
HCT: 36 % (ref 36.0–46.0)
Hemoglobin: 12.2 g/dL (ref 12.0–15.0)
O2 Saturation: 54 %
Potassium: 4.8 mmol/L (ref 3.5–5.1)
Sodium: 138 mmol/L (ref 135–145)
TCO2: 25 mmol/L (ref 22–32)
pCO2, Ven: 44 mmHg (ref 44.0–60.0)
pH, Ven: 7.337 (ref 7.250–7.430)
pO2, Ven: 30 mmHg — CL (ref 32.0–45.0)

## 2019-11-17 LAB — POCT ACTIVATED CLOTTING TIME: Activated Clotting Time: 114 seconds

## 2019-11-17 SURGERY — RIGHT/LEFT HEART CATH AND CORONARY/GRAFT ANGIOGRAPHY
Anesthesia: LOCAL

## 2019-11-17 MED ORDER — LABETALOL HCL 5 MG/ML IV SOLN: 10.0000 mg | INTRAVENOUS | Status: AC | PRN

## 2019-11-17 MED ORDER — SODIUM CHLORIDE 0.9 % WEIGHT BASED INFUSION
3.0000 mL/kg/h | INTRAVENOUS | Status: DC
  Administered 2019-11-17: 3 mL/kg/h via INTRAVENOUS

## 2019-11-17 MED ORDER — SODIUM CHLORIDE 0.9 % IV SOLN: 250.0000 mL | INTRAVENOUS | Status: DC | PRN

## 2019-11-17 MED ORDER — SODIUM CHLORIDE 0.9 % WEIGHT BASED INFUSION: 1.0000 mL/kg/h | INTRAVENOUS | Status: DC

## 2019-11-17 MED ORDER — ACETAMINOPHEN 325 MG PO TABS
650.0000 mg | ORAL_TABLET | ORAL | Status: DC | PRN
  Administered 2019-11-18 – 2019-11-22 (×8): 650 mg via ORAL
  Filled 2019-11-17 (×8): qty 2

## 2019-11-17 MED ORDER — SODIUM CHLORIDE 0.9% FLUSH: 3.0000 mL | INTRAVENOUS | Status: DC | PRN

## 2019-11-17 MED ORDER — METOPROLOL TARTRATE 50 MG PO TABS
50.0000 mg | ORAL_TABLET | Freq: Two times a day (BID) | ORAL | Status: DC
  Administered 2019-11-17: 50 mg via ORAL
  Filled 2019-11-17: qty 1

## 2019-11-17 MED ORDER — HEPARIN (PORCINE) IN NACL 1000-0.9 UT/500ML-% IV SOLN
INTRAVENOUS | Status: DC | PRN
  Administered 2019-11-17: 500 mL

## 2019-11-17 MED ORDER — LIDOCAINE HCL (PF) 1 % IJ SOLN
INTRAMUSCULAR | Status: AC
  Filled 2019-11-17: qty 30

## 2019-11-17 MED ORDER — IOHEXOL 350 MG/ML SOLN
INTRAVENOUS | Status: AC
  Filled 2019-11-17: qty 1

## 2019-11-17 MED ORDER — SODIUM CHLORIDE 0.9% FLUSH
3.0000 mL | Freq: Two times a day (BID) | INTRAVENOUS | Status: DC
  Administered 2019-11-17 – 2019-11-23 (×13): 3 mL via INTRAVENOUS

## 2019-11-17 MED ORDER — DIPHENHYDRAMINE HCL 50 MG/ML IJ SOLN
25.0000 mg | Freq: Once | INTRAMUSCULAR | Status: AC
  Administered 2019-11-17: 25 mg via INTRAVENOUS

## 2019-11-17 MED ORDER — DIPHENHYDRAMINE HCL 50 MG/ML IJ SOLN
INTRAMUSCULAR | Status: AC
  Filled 2019-11-17: qty 1

## 2019-11-17 MED ORDER — VERAPAMIL HCL 2.5 MG/ML IV SOLN
INTRAVENOUS | Status: AC
  Filled 2019-11-17: qty 2

## 2019-11-17 MED ORDER — HEPARIN (PORCINE) IN NACL 1000-0.9 UT/500ML-% IV SOLN
INTRAVENOUS | Status: AC
  Filled 2019-11-17: qty 1000

## 2019-11-17 MED ORDER — HEPARIN SODIUM (PORCINE) 1000 UNIT/ML IJ SOLN
INTRAMUSCULAR | Status: AC
  Filled 2019-11-17: qty 1

## 2019-11-17 MED ORDER — METHYLPREDNISOLONE SODIUM SUCC 125 MG IJ SOLR
INTRAMUSCULAR | Status: AC
  Filled 2019-11-17: qty 2

## 2019-11-17 MED ORDER — ONDANSETRON HCL 4 MG/2ML IJ SOLN: 4.0000 mg | Freq: Four times a day (QID) | INTRAMUSCULAR | Status: DC | PRN

## 2019-11-17 MED ORDER — ASPIRIN 81 MG PO CHEW: 81.0000 mg | CHEWABLE_TABLET | ORAL | Status: DC

## 2019-11-17 MED ORDER — METOPROLOL TARTRATE 25 MG PO TABS
25.0000 mg | ORAL_TABLET | Freq: Two times a day (BID) | ORAL | Status: DC
Start: 2019-11-17 — End: 2019-11-17

## 2019-11-17 MED ORDER — HEPARIN SODIUM (PORCINE) 5000 UNIT/ML IJ SOLN
5000.0000 [IU] | Freq: Three times a day (TID) | INTRAMUSCULAR | Status: DC
  Administered 2019-11-17 – 2019-11-22 (×13): 5000 [IU] via SUBCUTANEOUS
  Filled 2019-11-17 (×13): qty 1

## 2019-11-17 MED ORDER — METHYLPREDNISOLONE SODIUM SUCC 125 MG IJ SOLR
125.0000 mg | Freq: Once | INTRAMUSCULAR | Status: AC
  Administered 2019-11-17: 125 mg via INTRAVENOUS

## 2019-11-17 MED ORDER — SODIUM CHLORIDE 0.9% FLUSH
3.0000 mL | Freq: Two times a day (BID) | INTRAVENOUS | Status: DC
  Administered 2019-11-18 – 2019-11-24 (×9): 3 mL via INTRAVENOUS

## 2019-11-17 MED ORDER — LIDOCAINE HCL (PF) 1 % IJ SOLN
INTRAMUSCULAR | Status: DC | PRN
  Administered 2019-11-17: 20 mL

## 2019-11-17 MED ORDER — FUROSEMIDE 10 MG/ML IJ SOLN
40.0000 mg | Freq: Two times a day (BID) | INTRAMUSCULAR | Status: AC
  Administered 2019-11-17: 40 mg via INTRAVENOUS
  Filled 2019-11-17 (×2): qty 4

## 2019-11-17 MED ORDER — METOPROLOL TARTRATE 5 MG/5ML IV SOLN
INTRAVENOUS | Status: AC
  Filled 2019-11-17: qty 5

## 2019-11-17 MED ORDER — HYDRALAZINE HCL 20 MG/ML IJ SOLN: 10.0000 mg | INTRAMUSCULAR | Status: AC | PRN

## 2019-11-17 MED ORDER — METOPROLOL TARTRATE 5 MG/5ML IV SOLN
INTRAVENOUS | Status: DC | PRN
  Administered 2019-11-17 (×2): 2.5 mg via INTRAVENOUS

## 2019-11-17 SURGICAL SUPPLY — 16 items
CATH INFINITI 5 FR 3DRC (CATHETERS) ×2 IMPLANT
CATH INFINITI 5 FR IM (CATHETERS) ×2 IMPLANT
CATH INFINITI 5 FR LCB (CATHETERS) ×2 IMPLANT
CATH INFINITI 5FR AL1 (CATHETERS) ×2 IMPLANT
CATH INFINITI 5FR MULTPACK ANG (CATHETERS) ×2 IMPLANT
CATH SWAN GANZ 7F STRAIGHT (CATHETERS) ×2 IMPLANT
GUIDEWIRE ANGLED .035X150CM (WIRE) ×2 IMPLANT
KIT HEART LEFT (KITS) ×2 IMPLANT
PACK CARDIAC CATHETERIZATION (CUSTOM PROCEDURE TRAY) ×2 IMPLANT
SHEATH PINNACLE 5F 10CM (SHEATH) ×2 IMPLANT
SHEATH PINNACLE 7F 10CM (SHEATH) ×2 IMPLANT
SHEATH PROBE COVER 6X72 (BAG) ×2 IMPLANT
TRANSDUCER W/STOPCOCK (MISCELLANEOUS) ×2 IMPLANT
WIRE EMERALD 3MM-J .035X150CM (WIRE) ×2 IMPLANT
WIRE EMERALD 3MM-J .035X260CM (WIRE) ×2 IMPLANT
WIRE HI TORQ VERSACORE-J 145CM (WIRE) ×4 IMPLANT

## 2019-11-17 NOTE — Progress Notes (Signed)
43fr venous sheath aspirated and removed from RFV, manual pressure applied for 3 minutes.  5 fr sheath aspirated and removed from RFA, manual pressure applied over both sites for additional 20 mintues. Site level 0 , no s+s of hematoma.  Tegaderm dressing applied, bedrest instructions given.   Right pt pulse intermittent with doppler. Right foot warm to the touch but mottled below the ankle. Slight sore present on the bottom of her right heel.    Bedrest begins at 18:30:00

## 2019-11-17 NOTE — Progress Notes (Signed)
   11/17/19 1903  Assess: MEWS Score  Temp 98.2 F (36.8 C)  BP (!) 149/76  ECG Heart Rate (!) 129  Resp (!) 23  Level of Consciousness Alert  O2 Device Nasal Cannula  O2 Flow Rate (L/min) 4 L/min  Assess: MEWS Score  MEWS Temp 0  MEWS Systolic 0  MEWS Pulse 2  MEWS RR 1  MEWS LOC 0  MEWS Score 3  MEWS Score Color Yellow  Assess: if the MEWS score is Yellow or Red  Were vital signs taken at a resting state? Yes  Focused Assessment No change from prior assessment  Early Detection of Sepsis Score *See Row Information* Medium  MEWS guidelines implemented *See Row Information* Yes  Treat  MEWS Interventions Other (Comment) (new medications ordered)  Pain Scale 0-10  Pain Score 0  Take Vital Signs  Increase Vital Sign Frequency  Yellow: Q 2hr X 2 then Q 4hr X 2, if remains yellow, continue Q 4hrs  Escalate  MEWS: Escalate Yellow: discuss with charge nurse/RN and consider discussing with provider and RRT  Notify: Charge Nurse/RN  Name of Charge Nurse/RN Notified Monica RN  Date Charge Nurse/RN Notified 11/17/19  Time Charge Nurse/RN Notified 1910  Document  Patient Outcome Other (Comment) (stable on unit)  Progress note created (see row info) Yes

## 2019-11-17 NOTE — Progress Notes (Signed)
Received pt from [facility] via [place], alert and oriented X4, skin warm and dry, resp even and unlabored and denies any discomfort at this time.  Pt on monitor, IVF infusing and consent signed.  Pt waiting for cath procedure.  PA in to see patient..  Daughter updated.

## 2019-11-17 NOTE — Progress Notes (Signed)
   11/17/19 1953  Assess: MEWS Score  Temp 97.8 F (36.6 C)  BP 130/73  Pulse Rate (!) 128  ECG Heart Rate (!) 128  Resp (!) 22  SpO2 95 %  O2 Device Nasal Cannula  O2 Flow Rate (L/min) 4 L/min  Assess: MEWS Score  MEWS Temp 0  MEWS Systolic 0  MEWS Pulse 2  MEWS RR 1  MEWS LOC 0  MEWS Score 3  MEWS Score Color Yellow  Assess: if the MEWS score is Yellow or Red  Were vital signs taken at a resting state? Yes  Focused Assessment No change from prior assessment  Early Detection of Sepsis Score *See Row Information* Medium  MEWS guidelines implemented *See Row Information* Yes  Treat  Pain Scale 0-10  Pain Score 0  Take Vital Signs  Increase Vital Sign Frequency  Yellow: Q 2hr X 2 then Q 4hr X 2, if remains yellow, continue Q 4hrs  Escalate  MEWS: Escalate Yellow: discuss with charge nurse/RN and consider discussing with provider and RRT  Notify: Charge Nurse/RN  Name of Charge Nurse/RN Notified Christy RN  Date Charge Nurse/RN Notified 11/17/19  Time Charge Nurse/RN Notified 1915  Document  Patient Outcome Other (Comment) (New medicatons will be given for HR. pt denies any pain)  Progress note created (see row info) Yes

## 2019-11-17 NOTE — H&P (Addendum)
Cardiology Admission History and Physical:   Patient ID: Olivia [name] MRN: [id number]; DOB: 1929/06/23   Admission date: (Not on file)  Primary Care Provider: Nicoletta Dress, MD [facility] Cardiologist: Berniece Salines, DO , Dr. Jeneen Rinks [place] at [place] Electrophysiologist:  None   Chief Complaint:  Chest pain  Patient Profile:   Olivia [name] is a 86 y.o. female with a history of CAD s/p CABG and MV repair in 2008 followed by PCI, HTN, HLD, chronic systolic heart failure, PVD, and breast cancer s/p lumpectomy.   History of Present Illness:   [name] was hospitalized in [facility] Sept 2021 with palpitations and SOB. Echo showed EF 40-45% with WMA and moderate AS. Troponin was elevated. Options were discussed and medical management was felt to be the best decision for her. DAPT was continued. She is allergic to statins. Unfortunately, the patient presented back to [facility] 11/16/19 with worsening shortness of breath and found to have troponin 1.47. SOB felt to be her anginal equivalent. Cardiology was consulted and decision was made to transfer her to [facility] for heart catheterization.   Workup prior to arrival: Hb 12.0 WBC 10.2 sCr 1.10 K 5.2 Pro-BNP 5140  Prior to arrival, patient was on lisinopril 5 mg, lopressor 50 mg BID, ASA, plavix, PRN lasix, and flomax.  On arrival, she is on heparin drip and ASA. She has a dye allergy and will receive 125 mg solumedrol and 25 mg benadryl. Per report, she did not receive plavix. She reports that she is still short of breath and very anxious. She has not slept in 3 nights. She denies frank chest pain.    Past Medical History:  Diagnosis Date   Breast cancer ([place])    CAD (coronary artery disease) hx of cabg   Chronic systolic heart failure ([place])    Hx of CABG 2018   Hyperlipidemia with target LDL less than 70    Hypertension    PVD (peripheral vascular disease) ([place])     Past Surgical History:  Procedure  Laterality Date   BREAST LUMPECTOMY     MITRAL VALVE REPAIR (MV)/CORONARY ARTERY BYPASS GRAFTING (CABG)  2018     Medications Prior to Admission: Prior to Admission medications   Not on File     Allergies:    Allergies  Allergen Reactions   Contrast Media [Iodinated Diagnostic Agents]     Social History:   Social History   Socioeconomic History   Marital status: Widowed    Spouse name: Not on file   Number of children: Not on file   Years of education: Not on file   Highest education level: Not on file  Occupational History   Not on file  Tobacco Use   Smoking status: Unknown If Ever Smoked  Substance and Sexual Activity   Alcohol use: Not Currently   Drug use: Never   Sexual activity: Not on file  Other Topics Concern   Not on file  Social History Narrative   Not on file   Social Determinants of Health   Financial Resource Strain:    Difficulty of Paying Living Expenses: Not on file  Food Insecurity:    Worried About [place] in the Last Year: Not on file   Ran Out of Food in the Last Year: Not on file  Transportation Needs:    Lack of Transportation (Medical): Not on file   Lack of Transportation (Non-Medical): Not on file  Physical Activity:  Days of Exercise per Week: Not on file   Minutes of Exercise per Session: Not on file  Stress:    Feeling of Stress : Not on file  Social Connections:    Frequency of Communication with Friends and Family: Not on file   Frequency of Social Gatherings with Friends and Family: Not on file   Attends Religious Services: Not on file   Active Member of Clubs or Organizations: Not on file   Attends Archivist Meetings: Not on file   Marital Status: Not on file  Intimate Partner Violence:    Fear of Current or Ex-Partner: Not on file   Emotionally Abused: Not on file   Physically Abused: Not on file   Sexually Abused: Not on file    Family History:   The patient's family history includes  Hypertension in her father.    ROS:  Please see the history of present illness.  All other ROS reviewed and negative.     Physical Exam/Data:  There were no vitals filed for this visit. No intake or output data in the 24 hours ending 11/17/19 1258 Last 3 Weights 11/13/2019 10/29/2019  Weight (lbs) 126 lb 12.8 oz 134 lb 8 oz  Weight (kg) 57.516 kg 61.009 kg     There is no height or weight on file to calculate BMI.  General:  Thin, elderly female in NAD HEENT: normal Neck: no JVD Vascular: No carotid bruits  Cardiac:  normal S1, S2; RRR; systolic murmur, sternotomy scar Lungs:  clear to auscultation bilaterally, no wheezing, rhonchi or rales  Abd: soft, nontender, no hepatomegaly  Ext: no edema Musculoskeletal:  No deformities, BUE and BLE strength normal and equal Skin: warm and dry  Neuro:  CNs 2-12 intact, no focal abnormalities noted Psych:  Normal affect    EKG:  The ECG that was done at [facility] was personally reviewed and demonstrates sinus rhythm HR 98 with TWI lateral leads  Relevant CV Studies:  Left and right heart cath today  Laboratory Data:  High Sensitivity Troponin:  No results for input(s): TROPONINIHS in the last 720 hours.    ChemistryNo results for input(s): NA, K, CL, CO2, GLUCOSE, BUN, CREATININE, CALCIUM, GFRNONAA, GFRAA, ANIONGAP in the last 168 hours.  No results for input(s): PROT, ALBUMIN, AST, ALT, ALKPHOS, BILITOT in the last 168 hours. HematologyNo results for input(s): WBC, RBC, HGB, HCT, MCV, MCH, MCHC, RDW, PLT in the last 168 hours. BNPNo results for input(s): BNP, PROBNP in the last 168 hours.  DDimer No results for input(s): DDIMER in the last 168 hours.   Radiology/Studies:  No results found.   Assessment and Plan:   CAD s/p CABG 2008, subsequent PCI NSTEMI - shortness of breath felt to be her anginal equivalent - troponin 1.47 - transferred to [facility] for right and left heart cath   Acute on chronic systolic heart  failure - EF 40-45% with WMA  - elevated pro-BNP at [facility] - will address diuresis after heart cath when we understand her function a little better   Moderate aortic stenosis - will obtain gradients during cath   Hyperlipidemia with LDL goal < 70 - pt has allergy to statins - started on zetia - will need PCSK9i given progression of disease       TIMI Risk Score for Unstable Angina or Non-ST Elevation MI:   The patient's TIMI risk score is 5, which indicates a 26% risk of all cause mortality, new or recurrent myocardial infarction  or need for urgent revascularization in the next 14 days.  New York Heart Association (NYHA) Functional Class NYHA Class I   Severity of Illness: The appropriate patient status for this patient is INPATIENT. Inpatient status is judged to be reasonable and necessary in order to provide the required intensity of service to ensure the patient's safety. The patient's presenting symptoms, physical exam findings, and initial radiographic and laboratory data in the context of their chronic comorbidities is felt to place them at high risk for further clinical deterioration. Furthermore, it is not anticipated that the patient will be medically stable for discharge from the hospital within 2 midnights of admission. The following factors support the patient status of inpatient.   " The patient's presenting symptoms include chest pain. " The worrisome physical exam findings include NSTEMI. " The initial radiographic and laboratory data are worrisome because of NSTEMI. " The chronic co-morbidities include CAD.   * I certify that at the point of admission it is my clinical judgment that the patient will require inpatient hospital care spanning beyond 2 midnights from the point of admission due to high intensity of service, high risk for further deterioration and high frequency of surveillance required.*    For questions or updates, please contact [place] Please  consult [email or web address] for contact info under     Signed, Ledora Bottcher, PA  11/17/2019 12:58 PM   I have examined the patient and reviewed assessment and plan and discussed with patient.  Agree with above as stated.    Patient with hypoxia.  Needs cath to determine volume status, status of AS and to see if ACS is responsible for troponin elevation or if it is from heart failure.    Patient's questions answered.  She wants to be sedated but she has O2 sat of 88% on 4 L [place].   Larae Grooms  Addendum: low EF noted at cath.  PCWP > LVEDP which may indicate some mitral stenosis.  No targets for PCI.  Given her overall frailty, she will need medical therapy only.  Discussed at length with the daughter.  Plan for diuresis to see if this help her sx.

## 2019-11-17 NOTE — Progress Notes (Signed)
Per Dr. Irish Lack, [place] did not have a palpable pedal pulse following heart cath. Extremity is warm, no pain.  No intervention at this time.   Will hold plavix as patient was scheduled for lithotripsy - unclear when this will happen.  Given contrast, will hold off on restarting ACEI. Pt continues to complain of SOB. I have ordered a portable CXR. Dr. Irish Lack has ordered IV lasix x 2 doses. If she needs pressure support, would consider hydralazine/imdur.  I will addend my H&P to reflect that her CABG/MV repair was in 2008, not 2018 as stated in OSH records.   Ledora Bottcher, PA-C 11/17/2019, 6:50 PM [place], [place] [address]

## 2019-11-18 DIAGNOSIS — I5021 Acute systolic (congestive) heart failure: Secondary | ICD-10-CM | POA: Diagnosis not present

## 2019-11-18 LAB — BASIC METABOLIC PANEL
Anion gap: 10 (ref 5–15)
BUN: 21 mg/dL (ref 8–23)
CO2: 23 mmol/L (ref 22–32)
Calcium: 8.6 mg/dL — ABNORMAL LOW (ref 8.9–10.3)
Chloride: 106 mmol/L (ref 98–111)
Creatinine, Ser: 1.26 mg/dL — ABNORMAL HIGH (ref 0.44–1.00)
GFR, Estimated: 38 mL/min — ABNORMAL LOW (ref >60)
Glucose, Bld: 124 mg/dL — ABNORMAL HIGH (ref 70–99)
Potassium: 5.2 mmol/L — ABNORMAL HIGH (ref 3.5–5.1)
Sodium: 139 mmol/L (ref 135–145)

## 2019-11-18 LAB — TROPONIN I (HIGH SENSITIVITY)
Troponin I (High Sensitivity): 2740 ng/L (ref <18)
Troponin I (High Sensitivity): 1913 ng/L (ref <18)

## 2019-11-18 MED ORDER — METOPROLOL TARTRATE 25 MG PO TABS
25.0000 mg | ORAL_TABLET | Freq: Two times a day (BID) | ORAL | Status: DC
  Administered 2019-11-18 – 2019-11-19 (×3): 25 mg via ORAL
  Filled 2019-11-18 (×3): qty 1

## 2019-11-18 MED ORDER — ASPIRIN EC 81 MG PO TBEC
81.0000 mg | DELAYED_RELEASE_TABLET | Freq: Every day | ORAL | Status: DC
  Administered 2019-11-18 – 2019-11-24 (×7): 81 mg via ORAL
  Filled 2019-11-18 (×7): qty 1

## 2019-11-18 MED ORDER — DIPHENHYDRAMINE HCL 25 MG PO CAPS
25.0000 mg | ORAL_CAPSULE | Freq: Once | ORAL | Status: AC
  Administered 2019-11-18: 25 mg via ORAL
  Filled 2019-11-18: qty 1

## 2019-11-18 NOTE — Progress Notes (Signed)
Progress Note  Patient Name: Olivia [name] Date of Encounter: 11/18/2019  [facility] Cardiologist: Berniece Salines, DO   Subjective  Patient states that she is having a productive cough. No fevers or chills.  Blood pressures soft at 80/60s; HR 110 in sinus Net negative 120cc overnight  Inpatient Medications    Scheduled Meds: . furosemide  40 mg Intravenous BID  . heparin  5,000 Units Subcutaneous Q8H  . metoprolol tartrate  50 mg Oral BID  . sodium chloride flush  3 mL Intravenous Q12H  . sodium chloride flush  3 mL Intravenous Q12H   Continuous Infusions: . sodium chloride    . sodium chloride     PRN Meds: sodium chloride, sodium chloride, acetaminophen, ondansetron (ZOFRAN) IV, sodium chloride flush, sodium chloride flush   Vital Signs    Vitals:   11/17/19 2146 11/18/19 0117 11/18/19 0432 11/18/19 0902  BP: (!) 95/57 (!) 115/52 (!) 103/49 (!) 100/50  Pulse: 90 71 80   Resp: 20 16 19 20   Temp: 97.9 F (36.6 C) 97.6 F (36.4 C) 98.4 F (36.9 C) 97.9 F (36.6 C)  TempSrc: Oral Oral Oral Oral  SpO2: 97% 100% 100% 98%  Weight:   57.6 kg     Intake/Output Summary (Last 24 hours) at 11/18/2019 1200 Last data filed at 11/17/2019 2000 Gross per 24 hour  Intake 180 ml  Output 300 ml  Net -120 ml   Last 3 Weights 11/18/2019 11/13/2019 10/29/2019  Weight (lbs) 126 lb 15.8 oz 126 lb 12.8 oz 134 lb 8 oz  Weight (kg) 57.6 kg 57.516 kg 61.009 kg      Telemetry    Sinus tachycardia with PACs - Personally Reviewed  ECG    SR with TWI in lateral leads - Personally Reviewed  Physical Exam   GEN: Comfortable, eating lunch   Neck: No JVD Cardiac: Tachycardic, regular, 3/6 SEM. No rubs, or gallops.  Respiratory: Clear to auscultation bilaterally. GI: Soft, nontender, non-distended  MS: No edema; No deformity. Neuro:  Nonfocal  Psych: Normal affect   Labs    High Sensitivity Troponin:   Recent Labs  Lab 11/18/19 0620 11/18/19 0706  TROPONINIHS 2,740*  1,913*      Chemistry Recent Labs  Lab 11/17/19 1731 11/17/19 2029 11/18/19 0247  NA 138  138  --  139  K 4.8  4.8  --  5.2*  CL  --   --  106  CO2  --   --  23  GLUCOSE  --   --  124*  BUN  --   --  21  CREATININE  --  1.16* 1.26*  CALCIUM  --   --  8.6*  GFRNONAA  --  42* 38*  ANIONGAP  --   --  10     Hematology Recent Labs  Lab 11/17/19 1731 11/17/19 2029  WBC  --  9.1  RBC  --  3.65*  HGB 12.2  12.2 11.4*  HCT 36.0  36.0 35.5*  MCV  --  97.3  MCH  --  31.2  MCHC  --  32.1  RDW  --  12.9  PLT  --  225    BNPNo results for input(s): BNP, PROBNP in the last 168 hours.   DDimer No results for input(s): DDIMER in the last 168 hours.   Radiology    DG Chest 1 View  Result Date: 11/17/2019 CLINICAL DATA:  Dyspnea. EXAM: CHEST  1 VIEW COMPARISON:  11/17/2019  at 7:01 a.m., and earlier exams. FINDINGS: Interstitial areas of hazy airspace lung opacity are without significant change from the earlier study, consistent with a combination of chronic findings with superimposed interstitial and hazy airspace pulmonary edema. Cardiac silhouette is enlarged with stable changes from prior cardiac surgery and valve replacement. Possible small effusions. No pneumothorax. IMPRESSION: 1. No significant change from the study obtained earlier today. Findings compatible congestive heart failure with interstitial hazy airspace pulmonary edema superimposed on chronic interstitial lung disease. Electronically Signed   By: Lajean Manes M.D.   On: 11/17/2019 19:21   CARDIAC CATHETERIZATION  Result Date: 11/17/2019  Prox Cx to Mid Cx lesion is 99% stenosed. SVG to OM is occluded.  Prox LAD lesion is 70% stenosed. Mid LAD lesion is 75% stenosed. Patent LIMA to LAD.  Mid RCA lesion is 100% stenosed. Occluded SVG to RCA.  Severe three vessel disease.  The left ventricular ejection fraction is 35-45% by visual estimate.  LV end diastolic pressure is normal.  There is mild aortic valve  stenosis.  Ao sat 87%, PA sat 54%, PA pressure 54/20, mean PA 37 mm Hg, mean PCWP 23 mm Hg; CO 3.89 L/min; CI 2.4  Hemodynamic findings consistent with mild pulmonary hypertension.  Severe PAD as well with heavily calcified right common femoral artery.  Ischemic cardiomyopathy, pulmonary hypertension.  Sudden onset of shortness of breath this morning while awaiting kidney stone procedure.  ? takotsubo given the wall motion abnormality, however, she does have severe CAD. No good targets for PCI.  Continue medical therapy. She is most bothered by shortness of breath and palpitations.  HR consistently above 115.  LVEDP 14 but she is quite hypoxemic.  Would check chest xray to eval for pneumonia.  Will give a trial dose of furosemide to see if her breathing improves. Discussed findings with the daughter.     Cardiac Studies   Right and LHC:  Prox Cx to Mid Cx lesion is 99% stenosed. SVG to OM is occluded.  Prox LAD lesion is 70% stenosed. Mid LAD lesion is 75% stenosed. Patent LIMA to LAD.  Mid RCA lesion is 100% stenosed. Occluded SVG to RCA.  Severe three vessel disease.  The left ventricular ejection fraction is 35-45% by visual estimate.  LV end diastolic pressure is normal.  There is mild aortic valve stenosis.  Ao sat 87%, PA sat 54%, PA pressure 54/20, mean PA 37 mm Hg, mean PCWP 23 mm Hg; CO 3.89 L/min; CI 2.4  Hemodynamic findings consistent with mild pulmonary hypertension.  Severe PAD as well with heavily calcified right common femoral artery.   Ischemic cardiomyopathy, pulmonary hypertension.  Sudden onset of shortness of breath this morning while awaiting kidney stone procedure.  ? takotsubo given the wall motion abnormality, however, she does have severe CAD.   No good targets for PCI.  Continue medical therapy.   Prox Cx to Mid Cx lesion is 99% stenosed. SVG to OM is occluded.  Prox LAD lesion is 70% stenosed. Mid LAD lesion is 75% stenosed. Patent LIMA to LAD.  Mid  RCA lesion is 100% stenosed. Occluded SVG to RCA.  Severe three vessel disease.  The left ventricular ejection fraction is 35-45% by visual estimate.  LV end diastolic pressure is normal.  There is mild aortic valve stenosis.  Ao sat 87%, PA sat 54%, PA pressure 54/20, mean PA 37 mm Hg, mean PCWP 23 mm Hg; CO 3.89 L/min; CI 2.4  Hemodynamic findings consistent with mild pulmonary hypertension.  Severe PAD as well with heavily calcified right common femoral artery.   Ischemic cardiomyopathy, pulmonary hypertension.  Sudden onset of shortness of breath this morning while awaiting kidney stone procedure.  ? takotsubo given the wall motion abnormality, however, she does have severe CAD.   No good targets for PCI.  Continue medical therapy.   TTE 09/02/2017: Summary  Status-Post Mitral Valve Repair shows normally functioning valve.  Mild mitral regurgitation.  There is moderate aortic sclerosis noted, with no evidence of stenosis.  Moderate aortic regurgitation.  AI Pressure half-time 449  Tricuspid valve is structurally normal.  Mild tricuspid regurgitation.  RVSP 32 mm Hg.  Ejection fraction is visually estimated at 55-60% . There is hypokinesis of  the inferior and posterior walls especially near the base of the LV.     Patient Profile     86 y.o. female with history of CAD s/p CABG and MV repair in 2008 followed by PCI, HTN, HLD, chronic systolic heart failure, PVD, and breast cancer s/p lumpectomy who presented to [facility] with recurrence of SOB found to have elevated troponin prompting transfer for cath. Cath with severe native vessel CAD, patent LIMA-LAD, occluded SVG-RCA, occluded SVG-OM. On medical management  Assessment & Plan    #Severe multivessel CAD s/p ACB with subsequent PCIs #Elevated troponin #Shortness of breath Patient presented with worsening SOB thought to be anginal equivalent with elevated troponin. Cath with severe native vessel disease,  patent LIMA-LAD and occluded SVG-RCA and SVG-OM. No PCI targets. On medical therapy. -Start ASA 81mg  daily -Allergy to statins; continue zetia -Holding plavix given plans for lithotripsy  -Will down-titrate BB given soft blood pressures -Hold on further diuresis for now as patient is hypotensive and appears nearly euvolemic  #Acute on Chronic Systolic HF: -TTE with EF 40-45% with some WMA per report -Decrease metop to 25mg  BID given soft blood pressures -No ACE/ARB for now given elevated K and Cr and relative hypotension -Hold further diuresis for now given soft Bps and appears near euvolemic -Will need to monitor closely as BP is soft and HR elevated although patient was trying to move her tray and may have driven HR up with exertion  #Mild AS: -Trend with serial TTEs  #HLD with goal LDL<70 -Allergy to statins -Needs PCSK9 inhibitor as out patient -Continue zetia 10mg  daily  For questions or updates, please contact [facility] Please consult [email or web address] for contact info under     Signed, Freada Bergeron, MD  11/18/2019, 12:00 PM

## 2019-11-18 NOTE — Progress Notes (Signed)
Pt was hypotensive in the morning 102/53 MAP 64. PA [place] notified this matter and hold lasix at this time, but if her BP is getting better will administer, but BP 94/46. Hold metoprolol as well. Pt's daughter was asking why patient didn't have any medication today. Explained her the reason was hypotensive. She understood it. Daughter called for medications because her BP went up to 115/60. Will check one more time BP. If SBP and MAP is okay will give metoprolol 25 mg night dose. HS [employer]

## 2019-11-18 NOTE — Progress Notes (Signed)
Pt's BP was okay 114/50, but HR went up to 140's. Administered 10 pm dose of metoprolol 25 mg PO. HR down to 110-130's now. Paging PA Daleen Snook regarding this awaiting for call back. HS [employer]

## 2019-11-18 NOTE — Progress Notes (Signed)
CRITICAL VALUE ALERT TROPONIN Critical Value:  2740  Date & Time Notied:  11/18/19 0730  Provider Notified: Cardiology  Orders Received/Actions taken:  Awaiting for call back/orders

## 2019-11-18 NOTE — Progress Notes (Addendum)
Upon assessment patients face and torso are red and warm to the touch. Patient denies itchiness or pain other than her side. Vitals are 98.4 F, 134/60, HR 116, 95 % [place] 4L, RR 24. Patient denies shortness of breath. Only dyspnea with exertion. Lungs clear bilaterally, no wheezing. Paged cardiology, got orders for 25mg  benadryl PO. Per note patient is taking Zetia for statin allergy? But not on  [facility] or PTA list. Discussed this with Dr. Rudi Rummage. Will continue to monitor patient closely. Also, cardiology is okay for patient to get 2nd 25 mg metoprolol dose since she did not get it in the morning due to hypotension.

## 2019-11-19 DIAGNOSIS — I5021 Acute systolic (congestive) heart failure: Secondary | ICD-10-CM | POA: Diagnosis not present

## 2019-11-19 MED ORDER — ONDANSETRON HCL 4 MG/2ML IJ SOLN
4.0000 mg | Freq: Three times a day (TID) | INTRAMUSCULAR | Status: DC
  Administered 2019-11-19 – 2019-11-22 (×8): 4 mg via INTRAVENOUS
  Filled 2019-11-19 (×8): qty 2

## 2019-11-19 MED ORDER — METOPROLOL TARTRATE 25 MG PO TABS
25.0000 mg | ORAL_TABLET | Freq: Two times a day (BID) | ORAL | Status: DC
  Administered 2019-11-19 – 2019-11-20 (×2): 25 mg via ORAL
  Filled 2019-11-19 (×2): qty 1

## 2019-11-19 MED ORDER — POLYETHYLENE GLYCOL 3350 17 G PO PACK
17.0000 g | PACK | Freq: Two times a day (BID) | ORAL | Status: DC
  Administered 2019-11-19 – 2019-11-20 (×3): 17 g via ORAL
  Filled 2019-11-19 (×4): qty 1

## 2019-11-19 MED ORDER — METOPROLOL TARTRATE 5 MG/5ML IV SOLN: 5.0000 mg | Freq: Once | INTRAVENOUS | Status: AC

## 2019-11-19 MED ORDER — POLYETHYLENE GLYCOL 3350 17 G PO PACK
17.0000 g | PACK | Freq: Every day | ORAL | Status: DC | PRN
  Administered 2019-11-22: 17 g via ORAL
  Filled 2019-11-19: qty 1

## 2019-11-19 MED ORDER — METOPROLOL TARTRATE 5 MG/5ML IV SOLN
INTRAVENOUS | Status: AC
  Administered 2019-11-19: 5 mg via INTRAVENOUS
  Filled 2019-11-19: qty 5

## 2019-11-19 MED ORDER — DOCUSATE SODIUM 100 MG PO CAPS
100.0000 mg | ORAL_CAPSULE | Freq: Two times a day (BID) | ORAL | Status: DC
  Administered 2019-11-19 – 2019-11-20 (×4): 100 mg via ORAL
  Filled 2019-11-19 (×4): qty 1

## 2019-11-19 MED ORDER — HYDROXYZINE HCL 25 MG PO TABS
25.0000 mg | ORAL_TABLET | Freq: Once | ORAL | Status: AC | PRN
  Administered 2019-11-19: 25 mg via ORAL
  Filled 2019-11-19: qty 1

## 2019-11-19 MED ORDER — BISACODYL 5 MG PO TBEC: 5.0000 mg | DELAYED_RELEASE_TABLET | Freq: Every day | ORAL | Status: DC | PRN

## 2019-11-19 NOTE — Progress Notes (Addendum)
Pt requesting benadryl this pm for continued neck and chest redness. Pt denies itching, SOB, CP or any other complaints at this time. Per shift report, redness had improved until this evening around 4:30- 5 pm. Provider on call paged. Jessie Foot, [place]   [phone] page sent to provider. Jessie Foot, RN

## 2019-11-19 NOTE — Progress Notes (Signed)
   11/19/19 1747  Assess: MEWS Score  BP (!) 127/91  ECG Heart Rate (!) 141  Resp (!) 25  SpO2 95 %  O2 Device Room Air  Assess: MEWS Score  MEWS Temp 0  MEWS Systolic 0  MEWS Pulse 3  MEWS RR 1  MEWS LOC 0  MEWS Score 4  MEWS Score Color Red  Assess: if the MEWS score is Yellow or Red  Were vital signs taken at a resting state? Yes  Focused Assessment Change from prior assessment (see assessment flowsheet)  Early Detection of Sepsis Score *See Row Information* Low  MEWS guidelines implemented *See Row Information* Yes  Treat  MEWS Interventions Administered prn meds/treatments  Pain Scale 0-10  Pain Score 0  Complains of Shortness of breath  Interventions Medication (see MAR);Relaxation  Patients response to intervention Relief  Take Vital Signs  Increase Vital Sign Frequency  Red: Q 1hr X 4 then Q 4hr X 4, if remains red, continue Q 4hrs  Escalate  MEWS: Escalate Yellow: discuss with charge nurse/RN and consider discussing with provider and RRT  Notify: Charge Nurse/RN  Name of Charge Nurse/RN Notified Jessica, RN  Date Charge Nurse/RN Notified 11/19/19  Time Charge Nurse/RN Notified 1747  Notify: Provider  Provider Name/Title Vickki Muff, MD  Date Provider Notified 11/19/19  Time Provider Notified [phone]  Notification Type Page  Notification Reason Change in status  Response See new orders  Date of Provider Response 11/19/19  Time of Provider Response 9480  Document  Patient Outcome Stabilized after interventions  Progress note created (see row info) Yes

## 2019-11-19 NOTE — Progress Notes (Signed)
Pt's HR sustaining in 140s, BP 127/91 , RR 25, pt felt her heart racing, SOB. New EKG done. Cardiology on call notified.  Received order for metoprolol 5mg  IV. Pt's VS stable, BP 101/76, HR 100s, RR 22. Will closely monitor.

## 2019-11-19 NOTE — Progress Notes (Signed)
Progress Note  Patient Name: Olivia [name] Date of Encounter: 11/19/2019  [facility] Cardiologist: Berniece Salines, DO   Subjective  Patient is complaining of nausea after eating. Occurs ever since she developed renal stones. Also reports constipation.  There was concern for allergic reaction this morning with facial and torso flushing. HD stable and asymptomatic. Given benadryl.  Metoprolol initially held due to soft Bps; given later this morning.  Patient with multivessel CAD s/p ACB without target for PCI; on medical management  Inpatient Medications    Scheduled Meds: . aspirin EC  81 mg Oral Daily  . docusate sodium  100 mg Oral BID  . heparin  5,000 Units Subcutaneous Q8H  . metoprolol tartrate  25 mg Oral BID  . ondansetron (ZOFRAN) IV  4 mg Intravenous Q8H  . polyethylene glycol  17 g Oral BID  . sodium chloride flush  3 mL Intravenous Q12H  . sodium chloride flush  3 mL Intravenous Q12H   Continuous Infusions: . sodium chloride    . sodium chloride     PRN Meds: sodium chloride, sodium chloride, acetaminophen, bisacodyl, polyethylene glycol, sodium chloride flush, sodium chloride flush   Vital Signs    Vitals:   11/19/19 0328 11/19/19 0732 11/19/19 1015 11/19/19 1134  BP: (!) 114/49 (!) 128/54 (!) 101/48 (!) 118/54  Pulse: 76 99  (!) 102  Resp: 15 18 20 19   Temp: 98.7 F (37.1 C) 98 F (36.7 C)  98.2 F (36.8 C)  TempSrc: Oral Oral  Oral  SpO2: 100% 98% 97% 94%  Weight:        Intake/Output Summary (Last 24 hours) at 11/19/2019 1240 Last data filed at 11/19/2019 1015 Gross per 24 hour  Intake 300 ml  Output 1000 ml  Net -700 ml   Last 3 Weights 11/18/2019 11/13/2019 10/29/2019  Weight (lbs) 126 lb 15.8 oz 126 lb 12.8 oz 134 lb 8 oz  Weight (kg) 57.6 kg 57.516 kg 61.009 kg      Telemetry    Sinus tachycardia with PACs - Personally Reviewed  ECG    SR with TWI in lateral leads - Personally Reviewed  Physical Exam   GEN: Comfortable,  alert and awake   Neck: No JVD Cardiac: Tachycardic, regular, 3/6 SEM. No rubs, or gallops.  Mild flushing of her torso. Respiratory: CTAB GI: Soft, nontender, non-distended  MS: No edema; No deformity. Neuro:  Nonfocal  Psych: Normal affect   Labs    High Sensitivity Troponin:   Recent Labs  Lab 11/18/19 0620 11/18/19 0706  TROPONINIHS 2,740* 1,913*      Chemistry Recent Labs  Lab 11/17/19 1731 11/17/19 2029 11/18/19 0247  NA 138  138  --  139  K 4.8  4.8  --  5.2*  CL  --   --  106  CO2  --   --  23  GLUCOSE  --   --  124*  BUN  --   --  21  CREATININE  --  1.16* 1.26*  CALCIUM  --   --  8.6*  GFRNONAA  --  42* 38*  ANIONGAP  --   --  10     Hematology Recent Labs  Lab 11/17/19 1731 11/17/19 2029  WBC  --  9.1  RBC  --  3.65*  HGB 12.2  12.2 11.4*  HCT 36.0  36.0 35.5*  MCV  --  97.3  MCH  --  31.2  MCHC  --  32.1  RDW  --  12.9  PLT  --  225    BNPNo results for input(s): BNP, PROBNP in the last 168 hours.   DDimer No results for input(s): DDIMER in the last 168 hours.   Radiology    DG Chest 1 View  Result Date: 11/17/2019 CLINICAL DATA:  Dyspnea. EXAM: CHEST  1 VIEW COMPARISON:  11/17/2019 at 7:01 a.m., and earlier exams. FINDINGS: Interstitial areas of hazy airspace lung opacity are without significant change from the earlier study, consistent with a combination of chronic findings with superimposed interstitial and hazy airspace pulmonary edema. Cardiac silhouette is enlarged with stable changes from prior cardiac surgery and valve replacement. Possible small effusions. No pneumothorax. IMPRESSION: 1. No significant change from the study obtained earlier today. Findings compatible congestive heart failure with interstitial hazy airspace pulmonary edema superimposed on chronic interstitial lung disease. Electronically Signed   By: Lajean Manes M.D.   On: 11/17/2019 19:21   CARDIAC CATHETERIZATION  Result Date: 11/17/2019  Prox Cx to Mid  Cx lesion is 99% stenosed. SVG to OM is occluded.  Prox LAD lesion is 70% stenosed. Mid LAD lesion is 75% stenosed. Patent LIMA to LAD.  Mid RCA lesion is 100% stenosed. Occluded SVG to RCA.  Severe three vessel disease.  The left ventricular ejection fraction is 35-45% by visual estimate.  LV end diastolic pressure is normal.  There is mild aortic valve stenosis.  Ao sat 87%, PA sat 54%, PA pressure 54/20, mean PA 37 mm Hg, mean PCWP 23 mm Hg; CO 3.89 L/min; CI 2.4  Hemodynamic findings consistent with mild pulmonary hypertension.  Severe PAD as well with heavily calcified right common femoral artery.  Ischemic cardiomyopathy, pulmonary hypertension.  Sudden onset of shortness of breath this morning while awaiting kidney stone procedure.  ? takotsubo given the wall motion abnormality, however, she does have severe CAD. No good targets for PCI.  Continue medical therapy. She is most bothered by shortness of breath and palpitations.  HR consistently above 115.  LVEDP 14 but she is quite hypoxemic.  Would check chest xray to eval for pneumonia.  Will give a trial dose of furosemide to see if her breathing improves. Discussed findings with the daughter.     Cardiac Studies   Right and LHC:  Prox Cx to Mid Cx lesion is 99% stenosed. SVG to OM is occluded.  Prox LAD lesion is 70% stenosed. Mid LAD lesion is 75% stenosed. Patent LIMA to LAD.  Mid RCA lesion is 100% stenosed. Occluded SVG to RCA.  Severe three vessel disease.  The left ventricular ejection fraction is 35-45% by visual estimate.  LV end diastolic pressure is normal.  There is mild aortic valve stenosis.  Ao sat 87%, PA sat 54%, PA pressure 54/20, mean PA 37 mm Hg, mean PCWP 23 mm Hg; CO 3.89 L/min; CI 2.4  Hemodynamic findings consistent with mild pulmonary hypertension.  Severe PAD as well with heavily calcified right common femoral artery.   Ischemic cardiomyopathy, pulmonary hypertension.  Sudden onset of shortness of  breath this morning while awaiting kidney stone procedure.  ? takotsubo given the wall motion abnormality, however, she does have severe CAD.   No good targets for PCI.  Continue medical therapy.   Prox Cx to Mid Cx lesion is 99% stenosed. SVG to OM is occluded.  Prox LAD lesion is 70% stenosed. Mid LAD lesion is 75% stenosed. Patent LIMA to LAD.  Mid RCA lesion is 100% stenosed. Occluded SVG to  RCA.  Severe three vessel disease.  The left ventricular ejection fraction is 35-45% by visual estimate.  LV end diastolic pressure is normal.  There is mild aortic valve stenosis.  Ao sat 87%, PA sat 54%, PA pressure 54/20, mean PA 37 mm Hg, mean PCWP 23 mm Hg; CO 3.89 L/min; CI 2.4  Hemodynamic findings consistent with mild pulmonary hypertension.  Severe PAD as well with heavily calcified right common femoral artery.   Ischemic cardiomyopathy, pulmonary hypertension.  Sudden onset of shortness of breath this morning while awaiting kidney stone procedure.  ? takotsubo given the wall motion abnormality, however, she does have severe CAD.   No good targets for PCI.  Continue medical therapy.   TTE 09/02/2017: Summary  Status-Post Mitral Valve Repair shows normally functioning valve.  Mild mitral regurgitation.  There is moderate aortic sclerosis noted, with no evidence of stenosis.  Moderate aortic regurgitation.  AI Pressure half-time 449  Tricuspid valve is structurally normal.  Mild tricuspid regurgitation.  RVSP 32 mm Hg.  Ejection fraction is visually estimated at 55-60% . There is hypokinesis of  the inferior and posterior walls especially near the base of the LV.     Patient Profile     86 y.o. female with history of CAD s/p CABG and MV repair in 2008 followed by PCI, HTN, HLD, chronic systolic heart failure, PVD, and breast cancer s/p lumpectomy who presented to [facility] with recurrence of SOB found to have elevated troponin prompting transfer for cath. Cath  with severe native vessel CAD, patent LIMA-LAD, occluded SVG-RCA, occluded SVG-OM. On medical management  Assessment & Plan    #Severe multivessel CAD s/p ACB with subsequent PCIs #Elevated troponin #Shortness of breath Patient presented with worsening SOB thought to be anginal equivalent with elevated troponin. Cath with severe native vessel disease, patent LIMA-LAD and occluded SVG-RCA and SVG-OM. No PCI targets. On medical therapy. -Start ASA 81mg  daily -Allergy to statins; continue zetia -Holding plavix given plans for lithotripsy  -Continue metop 25mg  BID; okay to give if SBP>100. Helps significantly with HR -No diuresis for now; appears euvolemic and Bps can become soft  #Acute on Chronic Systolic HF: -TTE with EF 40-45% with some WMA per report -Continue metop 25mg  BID; okay to give with SBP>100 -No ACE/ARB for now given elevated K and Cr and relative hypotension -Holding diuretics given soft Bps and patient does not appear grossly overloaded  #Mild AS: -Trend with serial TTEs  #HLD with goal LDL<70 -Allergy to statins -Needs PCSK9 inhibitor as out patient -Continue zetia 10mg  daily  #? Allergic reaction: Some chest flushing. Otherwise stable without SOB, chest pain, wheezing, or itching. -Will monitor -No change in meds for now unless patient becomes symptomatic or rash recurs  For questions or updates, please contact [place] Please consult [email or web address] for contact info under     Signed, Freada Bergeron, MD  11/19/2019, 12:40 PM

## 2019-11-20 ENCOUNTER — Encounter (HOSPITAL_COMMUNITY): Payer: Self-pay | Admitting: Interventional Cardiology

## 2019-11-20 DIAGNOSIS — R778 Other specified abnormalities of plasma proteins: Secondary | ICD-10-CM

## 2019-11-20 DIAGNOSIS — I5041 Acute combined systolic (congestive) and diastolic (congestive) heart failure: Secondary | ICD-10-CM | POA: Diagnosis not present

## 2019-11-20 DIAGNOSIS — I25810 Atherosclerosis of coronary artery bypass graft(s) without angina pectoris: Secondary | ICD-10-CM | POA: Diagnosis not present

## 2019-11-20 DIAGNOSIS — I5021 Acute systolic (congestive) heart failure: Secondary | ICD-10-CM | POA: Diagnosis not present

## 2019-11-20 LAB — CBC
HCT: 35 % — ABNORMAL LOW (ref 36.0–46.0)
Hemoglobin: 11.2 g/dL — ABNORMAL LOW (ref 12.0–15.0)
MCH: 31.5 pg (ref 26.0–34.0)
MCHC: 32 g/dL (ref 30.0–36.0)
MCV: 98.6 fL (ref 80.0–100.0)
Platelets: 270 10*3/uL (ref 150–400)
RBC: 3.55 MIL/uL — ABNORMAL LOW (ref 3.87–5.11)
RDW: 13.1 % (ref 11.5–15.5)
WBC: 12.6 10*3/uL — ABNORMAL HIGH (ref 4.0–10.5)
nRBC: 0 % (ref 0.0–0.2)

## 2019-11-20 LAB — BASIC METABOLIC PANEL
Anion gap: 9 (ref 5–15)
BUN: 30 mg/dL — ABNORMAL HIGH (ref 8–23)
CO2: 24 mmol/L (ref 22–32)
Calcium: 9.1 mg/dL (ref 8.9–10.3)
Chloride: 103 mmol/L (ref 98–111)
Creatinine, Ser: 1.22 mg/dL — ABNORMAL HIGH (ref 0.44–1.00)
GFR, Estimated: 40 mL/min — ABNORMAL LOW (ref >60)
Glucose, Bld: 107 mg/dL — ABNORMAL HIGH (ref 70–99)
Potassium: 4.6 mmol/L (ref 3.5–5.1)
Sodium: 136 mmol/L (ref 135–145)

## 2019-11-20 MED ORDER — PREDNISONE 20 MG PO TABS
40.0000 mg | ORAL_TABLET | Freq: Every day | ORAL | Status: AC
  Administered 2019-11-20: 40 mg via ORAL
  Filled 2019-11-20: qty 2

## 2019-11-20 MED ORDER — METOPROLOL TARTRATE 25 MG PO TABS
25.0000 mg | ORAL_TABLET | Freq: Once | ORAL | Status: AC
  Administered 2019-11-20: 25 mg via ORAL
  Filled 2019-11-20: qty 1

## 2019-11-20 MED ORDER — CLOPIDOGREL BISULFATE 75 MG PO TABS
75.0000 mg | ORAL_TABLET | Freq: Every day | ORAL | Status: DC
  Administered 2019-11-20 – 2019-11-24 (×5): 75 mg via ORAL
  Filled 2019-11-20 (×5): qty 1

## 2019-11-20 MED ORDER — IOHEXOL 350 MG/ML SOLN
INTRAVENOUS | Status: DC | PRN
  Administered 2019-11-17: 85 mL via INTRA_ARTERIAL

## 2019-11-20 MED ORDER — PREDNISONE 5 MG PO TABS
5.0000 mg | ORAL_TABLET | Freq: Every day | ORAL | Status: AC
  Administered 2019-11-24: 5 mg via ORAL
  Filled 2019-11-20: qty 1

## 2019-11-20 MED ORDER — METOPROLOL TARTRATE 50 MG PO TABS
50.0000 mg | ORAL_TABLET | Freq: Two times a day (BID) | ORAL | Status: DC
  Administered 2019-11-20: 50 mg via ORAL
  Filled 2019-11-20: qty 1

## 2019-11-20 MED ORDER — PREDNISONE 20 MG PO TABS
30.0000 mg | ORAL_TABLET | Freq: Every day | ORAL | Status: AC
  Administered 2019-11-21: 30 mg via ORAL
  Filled 2019-11-20: qty 1

## 2019-11-20 MED ORDER — PREDNISONE 20 MG PO TABS
20.0000 mg | ORAL_TABLET | Freq: Every day | ORAL | Status: AC
  Administered 2019-11-22: 20 mg via ORAL
  Filled 2019-11-20: qty 1

## 2019-11-20 MED ORDER — PREDNISONE 10 MG PO TABS
10.0000 mg | ORAL_TABLET | Freq: Every day | ORAL | Status: AC
  Administered 2019-11-23: 10 mg via ORAL
  Filled 2019-11-20: qty 1

## 2019-11-20 NOTE — Progress Notes (Addendum)
The patient has been seen in conjunction with Vin Baghat, PAC. All aspects of care have been considered and discussed. The patient has been personally interviewed, examined, and all clinical data has been reviewed.   Feels better today.  HR is better now that she is back on he usual intensity beta blocker.  Also of note is that dysnea gradually worsened over the 3-5 days prior to admission. She was not taking diuretic regularly due to concern about recurrent dehydration according to daughter.  Rash is better.  Cath results noted. Plan medical therapy.  Hopefully home in AM.    Progress Note  Patient Name: Olivia [name] Date of Encounter: 11/20/2019  [facility] Cardiologist: Alphonzo Lemmings., MD   Subjective   Not feeling well. Feels palpitations. No chest pain. Breathing stable. Has itchy rash.   Inpatient Medications    Scheduled Meds: . aspirin EC  81 mg Oral Daily  . clopidogrel  75 mg Oral Daily  . docusate sodium  100 mg Oral BID  . heparin  5,000 Units Subcutaneous Q8H  . metoprolol tartrate  25 mg Oral Once  . metoprolol tartrate  50 mg Oral BID  . ondansetron (ZOFRAN) IV  4 mg Intravenous Q8H  . polyethylene glycol  17 g Oral BID  . predniSONE  40 mg Oral Q breakfast   Followed by  . [START ON 11/21/2019] predniSONE  30 mg Oral Q breakfast   Followed by  . [START ON 11/22/2019] predniSONE  20 mg Oral Q breakfast   Followed by  . [START ON 11/23/2019] predniSONE  10 mg Oral Q breakfast   Followed by  . [START ON 11/24/2019] predniSONE  5 mg Oral Q breakfast  . sodium chloride flush  3 mL Intravenous Q12H  . sodium chloride flush  3 mL Intravenous Q12H   Continuous Infusions: . sodium chloride    . sodium chloride     PRN Meds: sodium chloride, sodium chloride, acetaminophen, bisacodyl, polyethylene glycol, sodium chloride flush, sodium chloride flush   Vital Signs    Vitals:   11/20/19 0742 11/20/19 0816 11/20/19 0900 11/20/19 1100    BP: (!) 118/53 (!) 123/55 (!) 99/47 (!) 109/44  Pulse: (!) 107 (!) 115    Resp: 20 (!) 21 (!) 23 (!) 26  Temp: 98.7 F (37.1 C)     TempSrc: Oral     SpO2: 97% 98%  98%  Weight:        Intake/Output Summary (Last 24 hours) at 11/20/2019 1121 Last data filed at 11/20/2019 5631 Gross per 24 hour  Intake 120 ml  Output 700 ml  Net -580 ml   Last 3 Weights 11/20/2019 11/18/2019 11/13/2019  Weight (lbs) 129 lb 3.2 oz 126 lb 15.8 oz 126 lb 12.8 oz  Weight (kg) 58.605 kg 57.6 kg 57.516 kg      Telemetry    Sinus rhythm with frequent PVCs- Personally Reviewed  ECG    N/A  Physical Exam   GEN: No acute distress.   Neck: No JVD Cardiac: Irregular, no murmurs, rubs, or gallops.  Respiratory: Clear to auscultation bilaterally. GI: Soft, nontender, non-distended  Skin: Upper torso rash  MS: No edema; No deformity. Neuro:  Nonfocal  Psych: Normal affect   Labs    High Sensitivity Troponin:   Recent Labs  Lab 11/18/19 0620 11/18/19 0706  TROPONINIHS 2,740* 1,913*      Chemistry Recent Labs  Lab 11/17/19 1731 11/17/19 2029 11/18/19 0247  NA  138  138  --  139  K 4.8  4.8  --  5.2*  CL  --   --  106  CO2  --   --  23  GLUCOSE  --   --  124*  BUN  --   --  21  CREATININE  --  1.16* 1.26*  CALCIUM  --   --  8.6*  GFRNONAA  --  42* 38*  ANIONGAP  --   --  10     Hematology Recent Labs  Lab 11/17/19 1731 11/17/19 2029  WBC  --  9.1  RBC  --  3.65*  HGB 12.2  12.2 11.4*  HCT 36.0  36.0 35.5*  MCV  --  97.3  MCH  --  31.2  MCHC  --  32.1  RDW  --  12.9  PLT  --  225    Radiology    No results found.  Cardiac Studies   RIGHT/LEFT HEART CATH AND CORONARY/GRAFT ANGIOGRAPHY 11/17/2019  Conclusion    Prox Cx to Mid Cx lesion is 99% stenosed. SVG to OM is occluded.  Prox LAD lesion is 70% stenosed. Mid LAD lesion is 75% stenosed. Patent LIMA to LAD.  Mid RCA lesion is 100% stenosed. Occluded SVG to RCA.  Severe three vessel  disease.  The left ventricular ejection fraction is 35-45% by visual estimate.  LV end diastolic pressure is normal.  There is mild aortic valve stenosis.  Ao sat 87%, PA sat 54%, PA pressure 54/20, mean PA 37 mm Hg, mean PCWP 23 mm Hg; CO 3.89 L/min; CI 2.4  Hemodynamic findings consistent with mild pulmonary hypertension.  Severe PAD as well with heavily calcified right common femoral artery.   Ischemic cardiomyopathy, pulmonary hypertension.  Sudden onset of shortness of breath this morning while awaiting kidney stone procedure.  ? takotsubo given the wall motion abnormality, however, she does have severe CAD.   No good targets for PCI.  Continue medical therapy.   She is most bothered by shortness of breath and palpitations.  HR consistently above 115.   LVEDP 14 but she is quite hypoxemic.  Would check chest xray to eval for pneumonia.  Will give a trial dose of furosemide to see if her breathing improves.  Discussed findings with the daughter.     Patient Profile     86 y.o. female with history of CAD s/p CABG in 2008 and PCI afterwards, s/p mitral valve repair with mitral valve ring in 2008, ischemic cardiomyopathy, hypertension, hyperlipidemia with statin intolerance, breast cancer s/p lumpectomy, aortic stenosis and kidney stone (pending lithotripsy) transfer from [facility] with symptoms concerning for angina.  Ms. Mottram was hospitalized in [facility] Sept 2021 with palpitations and SOB. Echo showed EF 40-45% with WMA and moderate AS. Troponin was elevated. Options were discussed and medical management was felt to be the best decision for her. DAPT was continued.   Presented back to [facility] 11/16/19 with worsening shortness of breath and found to have troponin 1.47. SOB felt to be her anginal equivalent. Cardiology was consulted and decision was made to transfer her to [facility] for heart catheterization.   Assessment & Plan    1. CAD - Cath as above  showing multi-vessel disease. No PCI target. Recommended medical therapy. She is holding Plavix for lithotripsy which was scheduled last Friday but unable to do as she was in a hospital>> timing remain unclear.  Discussed with daughter who will reach out to surgeon  afterwards. -Restart Plavix -Continue ASA and BB - Statin intolerance  2. Rash -Patient is allergic to dye and she was premedicated prior to cardiac catheterization however patient has ongoing rash on upper torso with itching -No improvement despite additional Benadryl yesterday -Per daughter her rash is same as she had allergic reaction to dye previously -Start tapered dose of prednisone  3.  Palpitations/PVC -She was admitted to [facility] last month with palpitation -No sustained tachycardia noted on telemetry -Frequent PVC -Check electrolytes -Increase metoprolol to home dose at 50 mg twice daily -EKG at [facility] showed sinus tachycardia at rate of 148 bpm -No evidence of atrial fibrillation/flutter during this admission -Consider monitor as outpatient by primary cardiologist  4.  Kidney stone -Patient has missed lithotripsy due to current admission -Restart Plavix as above -Patient's daughter will reach out to surgeon after this admission  5.  Shortness of breath -No clear etiology found.  Chest x-ray at [facility] showed interstitial lung edema. -CAD as above -Her LVEDP was normal during cardiac catheterization.  Does not appear volume overloaded on exam. -Questionable her symptoms due to intermittent tachycardia  6.  Aortic stenosis -Moderate by echocardiogram at [facility] with mean gradient of 16 mmHg  7.  Peripheral arterial disease -Heavy calcification on right common femoral artery during cardiac cath -Continue aspirin and Plavix -Work-up by primary cardiologist as outpatient - Statin intolerance   8.  Chronic combined CHF -Echo at [facility] showed LV function of 45 to 50% with akinesis of basal to mid  inferior wall and moderate hypokinesis of the mid anterior lateral and apical lateral wall.  Left-Questionable mixed cardiomyopathy given underlying CAD and Takotsubo type presentation -Continue beta-blocker -Home lisinopril on hold secondary to soft blood pressure -Takes Lasix as needed as outpatient -Appears euvolemic currently   For questions or updates, please contact [facility] Please consult [email or web address] for contact info under        SignedLeanor Kail, PA  11/20/2019, 11:21 AM

## 2019-11-21 ENCOUNTER — Other Ambulatory Visit: Payer: Self-pay

## 2019-11-21 ENCOUNTER — Inpatient Hospital Stay (HOSPITAL_COMMUNITY): Payer: Medicare Other

## 2019-11-21 DIAGNOSIS — I5031 Acute diastolic (congestive) heart failure: Secondary | ICD-10-CM

## 2019-11-21 DIAGNOSIS — R778 Other specified abnormalities of plasma proteins: Secondary | ICD-10-CM | POA: Diagnosis not present

## 2019-11-21 DIAGNOSIS — I5021 Acute systolic (congestive) heart failure: Secondary | ICD-10-CM | POA: Diagnosis not present

## 2019-11-21 LAB — ECHOCARDIOGRAM COMPLETE
AR max vel: 0.93 cm2
AV Area VTI: 0.82 cm2
AV Area mean vel: 0.88 cm2
AV Mean grad: 11 mmHg
AV Peak grad: 19 mmHg
Ao pk vel: 2.18 m/s
Area-P 1/2: 3.53 cm2
Calc EF: 39.7 %
Height: 62 in
MV M vel: 5.21 m/s
MV Peak grad: 108.6 mmHg
P 1/2 time: 211 msec
Radius: 0.4 cm
S' Lateral: 5 cm
Single Plane A2C EF: 34 %
Single Plane A4C EF: 43.3 %
Weight: 1925.94 oz

## 2019-11-21 LAB — BRAIN NATRIURETIC PEPTIDE: B Natriuretic Peptide: 1081.4 pg/mL — ABNORMAL HIGH (ref 0.0–100.0)

## 2019-11-21 LAB — LIPID PANEL
Cholesterol: 221 mg/dL — ABNORMAL HIGH (ref 0–200)
HDL: 45 mg/dL (ref >40)
LDL Cholesterol: 143 mg/dL — ABNORMAL HIGH (ref 0–99)
Total CHOL/HDL Ratio: 4.9 RATIO
Triglycerides: 165 mg/dL — ABNORMAL HIGH (ref <150)
VLDL: 33 mg/dL (ref 0–40)

## 2019-11-21 LAB — D-DIMER, QUANTITATIVE: D-Dimer, Quant: 3.62 ug/mL-FEU — ABNORMAL HIGH (ref 0.00–0.50)

## 2019-11-21 LAB — MAGNESIUM: Magnesium: 2 mg/dL (ref 1.7–2.4)

## 2019-11-21 MED ORDER — SPIRONOLACTONE 12.5 MG HALF TABLET
12.5000 mg | ORAL_TABLET | Freq: Every day | ORAL | Status: DC
  Administered 2019-11-21: 12.5 mg via ORAL
  Filled 2019-11-21: qty 1

## 2019-11-21 MED ORDER — ENSURE ENLIVE PO LIQD
237.0000 mL | Freq: Two times a day (BID) | ORAL | Status: DC
  Administered 2019-11-21 – 2019-11-24 (×7): 237 mL via ORAL

## 2019-11-21 MED ORDER — DOCUSATE SODIUM 100 MG PO CAPS
100.0000 mg | ORAL_CAPSULE | Freq: Every day | ORAL | Status: DC
  Administered 2019-11-21 – 2019-11-24 (×4): 100 mg via ORAL
  Filled 2019-11-21 (×4): qty 1

## 2019-11-21 MED ORDER — METOPROLOL TARTRATE 50 MG PO TABS
75.0000 mg | ORAL_TABLET | Freq: Two times a day (BID) | ORAL | Status: DC
  Administered 2019-11-21 – 2019-11-24 (×7): 75 mg via ORAL
  Filled 2019-11-21 (×7): qty 1

## 2019-11-21 MED ORDER — TRAMADOL HCL 50 MG PO TABS
50.0000 mg | ORAL_TABLET | Freq: Three times a day (TID) | ORAL | Status: DC | PRN
  Administered 2019-11-21 – 2019-11-24 (×8): 50 mg via ORAL
  Filled 2019-11-21 (×8): qty 1

## 2019-11-21 MED ORDER — PANTOPRAZOLE SODIUM 40 MG PO TBEC
40.0000 mg | DELAYED_RELEASE_TABLET | Freq: Every day | ORAL | Status: DC
  Administered 2019-11-21 – 2019-11-24 (×4): 40 mg via ORAL
  Filled 2019-11-21 (×4): qty 1

## 2019-11-21 MED ORDER — BISACODYL 5 MG PO TBEC: 5.0000 mg | DELAYED_RELEASE_TABLET | Freq: Every day | ORAL | Status: DC | PRN

## 2019-11-21 MED ORDER — NITROGLYCERIN 1 MG/10 ML FOR IR/CATH LAB
INTRA_ARTERIAL | Status: AC
  Filled 2019-11-21: qty 10

## 2019-11-21 NOTE — Progress Notes (Signed)
Patient complaining of 5/10 sharp, central chest pain. EKG obtained. Patient states that this pain is new and that it scares her. Patient also stating pain occurs when she turns over. Pain resolved spontaneously without any medication. Notified Vin Bhagat PA who is up on floor.

## 2019-11-21 NOTE — Progress Notes (Signed)
Initial Nutrition Assessment  DOCUMENTATION CODES:   Severe malnutrition in context of acute illness/injury  INTERVENTION:    Ensure Enlive po BID, each supplement provides 350 kcal and 20 grams of protein  NUTRITION DIAGNOSIS:   Severe Malnutrition related to acute illness (pain from kidney stones causing decreased appetite and poor intake) as evidenced by severe muscle depletion, percent weight loss (10% weight loss within one month).  GOAL:   Patient will meet greater than or equal to 90% of their needs  MONITOR:   PO intake, Supplement acceptance  REASON FOR ASSESSMENT:   Malnutrition Screening Tool    ASSESSMENT:   86 yo female admitted with chest pain. PMH includes CAD s/p CABG & MVR repair in 2008, HTN, HLD, HF, PVD, breast cancer s/p lumpectomy.   S/P cardiac cath on admission which showed severe 3 vessel CAD.  Plans to begin Aldactone today with possible discharge home tomorrow if patient tolerates well and labs are stable.  Meal intakes average ~75%.  Usual weights reviewed. Patient weighed 61 kg on 10/29/19, currently 54.6 kg. 10% weight loss within the past month is severe.  Patient was scheduled to have lithotripsy, but procedure was cancelled due to acute issues. She is currently having back pain; RN aware.   Patient reports decreased appetite and poor intake recently due to pain from kidney stones. She endorses weight loss. Current intake is fair-good, but not as much as she would usually eat. She likes drinking the Ensure supplements.   Labs reviewed. Cholesterol 221, LDL 143, Triglycerides 165  Medications reviewed and include Colace, prednisone, Aldactone.  Patient meets criteria for severe malnutrition with severe depletion of muscle mass and 10% weight loss within one month.  NUTRITION - FOCUSED PHYSICAL EXAM:    Most Recent Value  Orbital Region Moderate depletion  Upper Arm Region Moderate depletion  Thoracic and Lumbar Region Mild depletion   Buccal Region Mild depletion  Temple Region Severe depletion  Clavicle Bone Region Severe depletion  Clavicle and Acromion Bone Region Severe depletion  Scapular Bone Region Severe depletion  Dorsal Hand Severe depletion  Patellar Region Moderate depletion  Anterior Thigh Region Moderate depletion  Posterior Calf Region Moderate depletion  Edema (RD Assessment) None  Hair Reviewed  Eyes Reviewed  Mouth Reviewed  Skin Reviewed  Nails Reviewed       Diet Order:   Diet Order            Diet Heart Room service appropriate? Yes; Fluid consistency: Thin  Diet effective now                 EDUCATION NEEDS:   No education needs have been identified at this time  Skin:  Skin Assessment: Reviewed RN Assessment  Last BM:  10/18  Height:   Ht Readings from Last 1 Encounters:  11/21/19 5\' 2"  (1.575 m)    Weight:   Wt Readings from Last 1 Encounters:  11/21/19 54.6 kg    Ideal Body Weight:  50 kg  BMI:  Body mass index is 22.02 kg/m.  Estimated Nutritional Needs:   Kcal:  1400-1600  Protein:  70-80 gm  Fluid:  1.5 L    Lucas Mallow, RD, LDN, CNSC Please refer to Amion for contact information.

## 2019-11-21 NOTE — Progress Notes (Signed)
Occupational Therapy Evaluation  PTA, pt lives alone and has assistance from her son and daughter as needed. Daughter assists with medication management. Pt does her own laundry and is very "independent". Pt close to baseline level. VSS throughout session. Does not have a history of falls. Has all necessary DME and necessary assistance to DC home safely. No further OT needed.  Recommend pt ambulate with staff.    11/21/19 1000  OT Visit Information  Last OT Received On 11/21/19  Assistance Needed +1  History of Present Illness Pt transferred from [facility] for heart cath after repeated admissions for SOB and incr troponins. Cath found severe CAD with no good targets for PCI and to be treated medically. PMH - CAD, CABG, PCI, MVR, ishemic cardiomyopathy, HTN, breast CA, aortic stenosis, kidney stone with pending lithotripsy.   Precautions  Precautions Fall  Home Living  Family/patient expects to be discharged to: Private residence  Living Arrangements Alone  Available Help at Discharge Family;Available PRN/intermittently (almost 24 hr. son during day and daughter at night)  Type of [place] to enter  Entrance Stairs-Number of Steps 1  Home Layout One level  Bathroom Shower/Tub Walk-in shower;Tub/shower unit  Corporate treasurer Yes  How Accessible Accessible via walker  [place] - 2 wheels;Shower seat;Grab bars - tub/shower;Hand held shower head  Prior Function  Level of Independence Needs assistance  Gait / Transfers Assistance Needed Modified independent with use of walker at times  ADL's / [place] modified independent with bathing/dressing  Comments Daughter takes grocery shopping and pt does shopping pushing a cart; daughter supervises medication management; pt does her own Merchandiser, retail HOH  Pain Assessment  Pain Assessment No/denies pain  Cognition   Arousal/Alertness Awake/alert  Behavior During Therapy WFL for tasks assessed/performed  Overall Cognitive Status Within Functional Limits for tasks assessed  Upper Extremity Assessment  Upper Extremity Assessment Overall WFL for tasks assessed  Lower Extremity Assessment  Lower Extremity Assessment Defer to PT evaluation  Cervical / Trunk Assessment  Cervical / Trunk Assessment Kyphotic  ADL  Overall ADL's  At baseline  Vision- History  Baseline Vision/History Wears glasses  Bed Mobility  General bed mobility comments OOB in chair  Transfers  Overall transfer level Needs assistance  Transfers Sit to/from Stand  Sit to Stand Supervision  General transfer comment occasionally uses RW; no falls  Balance  Overall balance assessment Mild deficits observed, not formally tested  General Comments  General comments (skin integrity, edema, etc.) VSS  OT - End of Session  Activity Tolerance Patient tolerated treatment well  Patient left in chair;with call bell/phone within reach;with chair alarm set  Nurse Communication Mobility status  OT Assessment  OT Recommendation/Assessment Patient does not need any further OT services  OT Visit Diagnosis Unsteadiness on feet (R26.81)  OT Problem List Cardiopulmonary status limiting activity  AM-PAC OT "6 Clicks" Daily Activity Outcome Measure (Version 2)  Help from another person eating meals? 4  Help from another person taking care of personal grooming? 4  Help from another person toileting, which includes using toliet, bedpan, or urinal? 4  Help from another person bathing (including washing, rinsing, drying)? 4  Help from another person to put on and taking off regular upper body clothing? 4  Help from another person to put on and taking off regular lower body clothing? 4  6 Click Score 24  OT Recommendation  Follow Up Recommendations  No OT follow up;Supervision - Intermittent  OT Equipment None recommended by OT  Acute Rehab OT Goals   Patient Stated Goal return home  OT Goal Formulation All assessment and education complete, DC therapy  OT Time Calculation  OT Start Time (ACUTE ONLY) 0950  OT Stop Time (ACUTE ONLY) 1003  OT Time Calculation (min) 13 min  OT General Charges  $OT Visit 1 Visit  OT Evaluation  $OT Eval Low Complexity 1 Low  Written Expression  Dominant Hand Left  Maurie Boettcher, OT/L   Acute OT Clinical Specialist Acute Rehabilitation Services Pager [phone] Office [phone]

## 2019-11-21 NOTE — Evaluation (Signed)
Physical Therapy Evaluation Patient Details Name: Olivia [name] MRN: [id number] DOB: [birth year] Today's Date: 11/21/2019   History of Present Illness  Pt transferred from [facility] for heart cath after repeated admissions for SOB and incr troponins. Cath found severe CAD with no good targets for PCI and to be treated medically. PMH - CAD, CABG, PCI, MVR, ishemic cardiomyopathy, HTN, breast CA, aortic stenosis, kidney stone with pending lithotripsy.   Clinical Impression  Pt presents to PT with slightly unsteady gait due to illness and inactivity. Expect pt will make good progress back to baseline with mobility. Will follow acutely but doubt pt will need PT after DC. From PT standpoint can return home when medically ready.      Follow Up Recommendations No PT follow up    Equipment Recommendations  None recommended by PT    Recommendations for Other Services       Precautions / Restrictions Precautions Precautions: Fall Restrictions Weight Bearing Restrictions: No      Mobility  Bed Mobility Overal bed mobility: Modified Independent                Transfers Overall transfer level: Needs assistance Equipment used: Rolling walker (2 wheeled) Transfers: Sit to/from Stand Sit to Stand: Supervision         General transfer comment: Incr time to rise but no physical assist  Ambulation/Gait Ambulation/Gait assistance: Supervision Gait Distance (Feet): 175 Feet Assistive device: Rolling walker (2 wheeled) Gait Pattern/deviations: Step-through pattern;Decreased stride length Gait velocity: normal for age Gait velocity interpretation: >2.62 ft/sec, indicative of community ambulatory General Gait Details: Using walker pt with steady gait  Stairs            Wheelchair Mobility    Modified Rankin (Stroke Patients Only)       Balance Overall balance assessment: Mild deficits observed, not formally tested                                            Pertinent Vitals/Pain Pain Assessment: No/denies pain    Home Living Family/patient expects to be discharged to:: Private residence Living Arrangements: Alone Available Help at Discharge: Family;Available PRN/intermittently (almost 24 hr. son during day and daughter at night) Type of Home: House Home Access: Stairs to enter   [employer] of Steps: 1 Home Layout: One level Home Equipment: Environmental consultant - 2 wheels      Prior Function Level of Independence: Needs assistance   Gait / Transfers Assistance Needed: Modified independent with use of walker at times  ADL's / Homemaking Assistance Needed: modified independent with bathing/dressing  Comments: Daughter takes grocery shopping and pt does shopping with motorized cart     Hand Dominance   Dominant Hand: Left    Extremity/Trunk Assessment   Upper Extremity Assessment Upper Extremity Assessment: Defer to OT evaluation    Lower Extremity Assessment Lower Extremity Assessment: Overall WFL for tasks assessed       Communication   Communication: HOH  Cognition Arousal/Alertness: Awake/alert Behavior During Therapy: WFL for tasks assessed/performed Overall Cognitive Status: Within Functional Limits for tasks assessed                                        General Comments General comments (skin integrity, edema, etc.): HR 90's with amb  and SpO2 >95% on RA    Exercises     Assessment/Plan    PT Assessment Patient needs continued PT services  PT Problem List Decreased mobility;Decreased activity tolerance       PT Treatment Interventions DME instruction;Gait training;Functional mobility training;Therapeutic activities;Balance training;Therapeutic exercise;Patient/family education    PT Goals (Current goals can be found in the Care Plan section)  Acute Rehab PT Goals Patient Stated Goal: return home PT Goal Formulation: With patient Time For Goal Achievement:  11/28/19 Potential to Achieve Goals: Good    Frequency Min 2X/week   Barriers to discharge        Co-evaluation               AM-PAC PT "6 Clicks" Mobility  Outcome Measure Help needed turning from your back to your side while in a flat bed without using bedrails?: None Help needed moving from lying on your back to sitting on the side of a flat bed without using bedrails?: None Help needed moving to and from a bed to a chair (including a wheelchair)?: None Help needed standing up from a chair using your arms (e.g., wheelchair or bedside chair)?: None Help needed to walk in hospital room?: A Little Help needed climbing 3-5 steps with a railing? : A Little 6 Click Score: 22    End of Session Equipment Utilized During Treatment: Gait belt Activity Tolerance: Patient tolerated treatment well Patient left: in chair;with call bell/phone within reach;with chair alarm set Nurse Communication: Mobility status;Other (comment) (removal of purewick) PT Visit Diagnosis: Other abnormalities of gait and mobility (R26.89)    Time: 1791-5056 PT Time Calculation (min) (ACUTE ONLY): 23 min   Charges:   PT Evaluation $PT Eval Moderate Complexity: 1 Mod PT Treatments $Gait Training: 8-22 mins        [place] Pager [phone] Office [place] 11/21/2019, 9:51 AM

## 2019-11-21 NOTE — Progress Notes (Signed)
Echocardiogram 2D Echocardiogram has been performed.  Olivia [name] 11/21/2019, 2:57 PM

## 2019-11-21 NOTE — Progress Notes (Addendum)
The patient has been seen in conjunction with Vin Bhagat, PAC. All aspects of care have been considered and discussed. The patient has been personally interviewed, examined, and all clinical data has been reviewed.   We will need chronic diuretic therapy at home.  We will start Aldactone 12.5 mg p.o. daily.  May need intermittent furosemide if shortness of breath.  Not sure how she will handle mineralocorticoid receptor antagonism.  Check basic metabolic panel tomorrow a.m.  If all stable, discharged home at that time.  I do not believe we need to add isosorbide mononitrate.  PT has evaluated the patient.  Agrees that patient is safe to discharge independently.  2-D echo ordered.  Progress Note  Patient Name: Olivia [name] Date of Encounter: 11/21/2019  [facility] Cardiologist: Alphonzo Lemmings., MD   Subjective   Sharp chest pain this morning which resolved spontaneously. On 1L oxygen, now this admission>> wean off. Pending PT evaluation. Rash improving.   Inpatient Medications    Scheduled Meds: . aspirin EC  81 mg Oral Daily  . clopidogrel  75 mg Oral Daily  . docusate sodium  100 mg Oral BID  . feeding supplement  237 mL Oral BID BM  . heparin  5,000 Units Subcutaneous Q8H  . metoprolol tartrate  50 mg Oral BID  . ondansetron (ZOFRAN) IV  4 mg Intravenous Q8H  . polyethylene glycol  17 g Oral BID  . predniSONE  30 mg Oral Q breakfast   Followed by  . [START ON 11/22/2019] predniSONE  20 mg Oral Q breakfast   Followed by  . [START ON 11/23/2019] predniSONE  10 mg Oral Q breakfast   Followed by  . [START ON 11/24/2019] predniSONE  5 mg Oral Q breakfast  . sodium chloride flush  3 mL Intravenous Q12H  . sodium chloride flush  3 mL Intravenous Q12H   Continuous Infusions: . sodium chloride    . sodium chloride     PRN Meds: sodium chloride, sodium chloride, acetaminophen, bisacodyl, polyethylene glycol, sodium chloride flush, sodium chloride flush    Vital Signs    Vitals:   11/20/19 1949 11/21/19 0045 11/21/19 0500 11/21/19 0731  BP: (!) 131/59 128/64 133/63   Pulse: 88 65 80   Resp: (!) 21  18   Temp: 98.2 F (36.8 C) 98.4 F (36.9 C) 98 F (36.7 C)   TempSrc: Oral Oral Oral   SpO2: 97%  97%   Weight:   54.6 kg   Height:    5\' 2"  (1.575 m)    Intake/Output Summary (Last 24 hours) at 11/21/2019 0824 Last data filed at 11/21/2019 0500 Gross per 24 hour  Intake 666 ml  Output 1100 ml  Net -434 ml   Last 3 Weights 11/21/2019 11/20/2019 11/18/2019  Weight (lbs) 120 lb 5.9 oz 129 lb 3.2 oz 126 lb 15.8 oz  Weight (kg) 54.6 kg 58.605 kg 57.6 kg      Telemetry    SR with PVCs - Rate in 90-100s - Personally Reviewed  ECG    Sinus tachycardia, LVH  Physical Exam   GEN: Frail thin appearing elderly female in no acute distress.   Neck: No JVD Cardiac: RRR, no murmurs, rubs, or gallops.  Respiratory: Clear to auscultation bilaterally. GI: Soft, nontender, non-distended  MS: No edema; No deformity. Neuro:  Nonfocal  Psych: Normal affect   Labs    High Sensitivity Troponin:   Recent Labs  Lab 11/18/19 0620 11/18/19 0706  TROPONINIHS 2,740* 1,913*      Chemistry Recent Labs  Lab 11/17/19 1731 11/17/19 2029 11/18/19 0247 11/20/19 1108  NA 138  138  --  139 136  K 4.8  4.8  --  5.2* 4.6  CL  --   --  106 103  CO2  --   --  23 24  GLUCOSE  --   --  124* 107*  BUN  --   --  21 30*  CREATININE  --  1.16* 1.26* 1.22*  CALCIUM  --   --  8.6* 9.1  GFRNONAA  --  42* 38* 40*  ANIONGAP  --   --  10 9     Hematology Recent Labs  Lab 11/17/19 1731 11/17/19 2029 11/20/19 1108  WBC  --  9.1 12.6*  RBC  --  3.65* 3.55*  HGB 12.2  12.2 11.4* 11.2*  HCT 36.0  36.0 35.5* 35.0*  MCV  --  97.3 98.6  MCH  --  31.2 31.5  MCHC  --  32.1 32.0  RDW  --  12.9 13.1  PLT  --  225 270    Radiology    No results found.  Cardiac Studies   RIGHT/LEFT HEART CATH AND CORONARY/GRAFT ANGIOGRAPHY 11/17/2019   Conclusion    Prox Cx to Mid Cx lesion is 99% stenosed. SVG to OM is occluded.  Prox LAD lesion is 70% stenosed. Mid LAD lesion is 75% stenosed. Patent LIMA to LAD.  Mid RCA lesion is 100% stenosed. Occluded SVG to RCA.  Severe three vessel disease.  The left ventricular ejection fraction is 35-45% by visual estimate.  LV end diastolic pressure is normal.  There is mild aortic valve stenosis.  Ao sat 87%, PA sat 54%, PA pressure 54/20, mean PA 37 mm Hg, mean PCWP 23 mm Hg; CO 3.89 L/min; CI 2.4  Hemodynamic findings consistent with mild pulmonary hypertension.  Severe PAD as well with heavily calcified right common femoral artery.  Ischemic cardiomyopathy, pulmonary hypertension. Sudden onset of shortness of breath this morning while awaiting kidney stone procedure. ? takotsubo given the wall motion abnormality, however, she does have severe CAD.   No good targets for PCI. Continue medical therapy.   She is most bothered by shortness of breath and palpitations. HR consistently above 115.  LVEDP 14 but she is quite hypoxemic. Would check chest xray to eval for pneumonia. Will give a trial dose of furosemide to see if her breathing improves.  Discussed findings with the daughter.    Echo done outside hospital:  LV function of 45 to 50% with akinesis of basal to mid inferior wall and moderate hypokinesis of the mid anterior lateral and apical lateral wall.  Patient Profile     86 y.o. female with history of CAD s/p CABG in 2008 and PCI afterwards, s/p mitral valve repair with mitral valve ring in 2008, ischemic cardiomyopathy, hypertension, hyperlipidemia with statin intolerance, breast cancer s/p lumpectomy, aortic stenosis and kidney stone (pending lithotripsy) transfer from [facility] with symptoms concerning for angina.  Assessment & Plan    1. CAD - Cath as above showing multi-vessel disease. No PCI target. Recommended medical therapy. She is  holding Plavix for lithotripsy which was scheduled last Friday but unable to do as she was in a hospital>> timing remain unclear.  Discussed with daughter who will reach out to surgeon afterwards. -Restarted Plavix -Continue ASA and BB (increase further to 75mg  BID) - Statin intolerance  2. Rash -Patient  is allergic to dye and she was premedicated prior to cardiac catheterization however patient has ongoing rash on upper torso with itching -No improvement despite additional Benadryl yesterday -Per daughter her rash is same as she had allergic reaction to dye previously -Start tapered dose of prednisone - Now improving   3.  Palpitations/PVC -No evidence of atrial fibrillation/flutter during this admission -Consider monitor as outpatient by primary cardiologist  - Increase metoprolol to 75mg  BID  4.  Kidney stone -Patient has missed lithotripsy due to current admission -Restart Plavix as above -Patient's daughter will reach out to surgeon after this admission  5.  Aortic stenosis -Moderate by echocardiogram at [facility] with mean gradient of 16 mmHg  6.  Peripheral arterial disease -Heavy calcification on right common femoral artery during cardiac cath -Continue aspirin and Plavix -Work-up by primary cardiologist as outpatient - Statin intolerance   7.  Chronic combined CHF -Echo at [facility] showed LV function of 45 to 50% with akinesis of basal to mid inferior wall and moderate hypokinesis of the mid anterior lateral and apical lateral wall.  - Questionable mixed cardiomyopathy given underlying CAD and Takotsubo type presentation -Continue beta-blocker (incerased today)  -Home lisinopril on hold  -Appears euvolemic currently  - Check BNP  Recurrent chest pain this morning sounds atypical. Repeat EKG looks better. Check D-dimer and BNP. Some reproducible chest pain with palpation. Hx unreliable. Pending PT evaluation. ? Add Imdur but her symptoms not typical of angina.  Restart home PPI,.   For questions or updates, please contact [place] Please consult [email or web address] for contact info under        SignedLeanor Kail, PA  11/21/2019, 8:24 AM

## 2019-11-22 ENCOUNTER — Inpatient Hospital Stay (HOSPITAL_COMMUNITY): Payer: Medicare Other

## 2019-11-22 DIAGNOSIS — I5021 Acute systolic (congestive) heart failure: Secondary | ICD-10-CM | POA: Diagnosis not present

## 2019-11-22 DIAGNOSIS — R7989 Other specified abnormal findings of blood chemistry: Secondary | ICD-10-CM

## 2019-11-22 DIAGNOSIS — R778 Other specified abnormalities of plasma proteins: Secondary | ICD-10-CM | POA: Diagnosis not present

## 2019-11-22 LAB — BASIC METABOLIC PANEL
Anion gap: 8 (ref 5–15)
BUN: 30 mg/dL — ABNORMAL HIGH (ref 8–23)
CO2: 25 mmol/L (ref 22–32)
Calcium: 9.5 mg/dL (ref 8.9–10.3)
Chloride: 103 mmol/L (ref 98–111)
Creatinine, Ser: 1.11 mg/dL — ABNORMAL HIGH (ref 0.44–1.00)
GFR, Estimated: 44 mL/min — ABNORMAL LOW (ref >60)
Glucose, Bld: 103 mg/dL — ABNORMAL HIGH (ref 70–99)
Potassium: 4.5 mmol/L (ref 3.5–5.1)
Sodium: 136 mmol/L (ref 135–145)

## 2019-11-22 LAB — HEPARIN LEVEL (UNFRACTIONATED): Heparin Unfractionated: 0.45 IU/mL (ref 0.30–0.70)

## 2019-11-22 LAB — HEMOGLOBIN A1C
Hgb A1c MFr Bld: 5.4 % (ref 4.8–5.6)
Mean Plasma Glucose: 108 mg/dL

## 2019-11-22 MED ORDER — FUROSEMIDE 10 MG/ML IJ SOLN
INTRAMUSCULAR | Status: AC
  Administered 2019-11-22: 40 mg via INTRAVENOUS
  Filled 2019-11-22: qty 4

## 2019-11-22 MED ORDER — ONDANSETRON 4 MG PO TBDP
4.0000 mg | ORAL_TABLET | Freq: Three times a day (TID) | ORAL | Status: DC
  Administered 2019-11-22 – 2019-11-24 (×6): 4 mg via ORAL
  Filled 2019-11-22 (×8): qty 1

## 2019-11-22 MED ORDER — METHYLPREDNISOLONE SODIUM SUCC 40 MG IJ SOLR
40.0000 mg | Freq: Once | INTRAMUSCULAR | Status: AC
  Administered 2019-11-22: 40 mg via INTRAVENOUS
  Filled 2019-11-22: qty 1

## 2019-11-22 MED ORDER — ENOXAPARIN SODIUM 30 MG/0.3ML ~~LOC~~ SOLN
30.0000 mg | SUBCUTANEOUS | Status: DC
  Administered 2019-11-22 – 2019-11-23 (×2): 30 mg via SUBCUTANEOUS
  Filled 2019-11-22 (×2): qty 0.3

## 2019-11-22 MED ORDER — HEPARIN (PORCINE) 25000 UT/250ML-% IV SOLN
950.0000 [IU]/h | INTRAVENOUS | Status: DC
  Administered 2019-11-22: 950 [IU]/h via INTRAVENOUS
  Filled 2019-11-22: qty 250

## 2019-11-22 MED ORDER — DIPHENHYDRAMINE HCL 50 MG/ML IJ SOLN
25.0000 mg | Freq: Once | INTRAMUSCULAR | Status: AC
  Administered 2019-11-22: 25 mg via INTRAVENOUS
  Filled 2019-11-22: qty 1

## 2019-11-22 MED ORDER — IOHEXOL 350 MG/ML SOLN
48.0000 mL | Freq: Once | INTRAVENOUS | Status: AC | PRN
  Administered 2019-11-22: 48 mL via INTRAVENOUS

## 2019-11-22 MED ORDER — LEVOTHYROXINE SODIUM 50 MCG PO TABS
50.0000 ug | ORAL_TABLET | Freq: Every day | ORAL | Status: DC
  Administered 2019-11-22 – 2019-11-24 (×3): 50 ug via ORAL
  Filled 2019-11-22 (×3): qty 1

## 2019-11-22 MED ORDER — FUROSEMIDE 10 MG/ML IJ SOLN: 40.0000 mg | Freq: Once | INTRAMUSCULAR | Status: AC

## 2019-11-22 MED ORDER — SPIRONOLACTONE 25 MG PO TABS
25.0000 mg | ORAL_TABLET | Freq: Every day | ORAL | Status: DC
  Administered 2019-11-22 – 2019-11-24 (×3): 25 mg via ORAL
  Filled 2019-11-22 (×3): qty 1

## 2019-11-22 NOTE — Progress Notes (Addendum)
The patient has been seen in conjunction with Harlan Stains, NP. All aspects of care have been considered and discussed. The patient has been personally interviewed, examined, and all clinical data has been reviewed.   Again, there was significant dyspnea during the night occurring suddenly.  This is despite reasonable diuresis yesterday.  BNP is elevated as is D-dimer.  Plan continued diuresis, chest CT with angiography to exclude pulmonary emboli, pretreatment for contrast allergy, and IV heparin anticoagulation until PE is excluded.  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  Total occlusion of the right coronary and high-grade circumflex.  No good PCI targets.  I am really concerned about this patient.  I do believe she needs more aggressive diuresis.  Contrast will increase her risk of acute kidney injury.  We will discuss this with the patient's daughter.   Overall prognosis given the multitude of problems is guarded.   Progress Note  Patient Name: Olivia [name] Date of Encounter: 11/22/2019  [facility] Cardiologist: Berniece Salines, DO   Subjective   Breathing is better, is having some pain in her lower chest when she swallows while eating breakfast this morning.   Inpatient Medications    Scheduled Meds: . aspirin EC  81 mg Oral Daily  . clopidogrel  75 mg Oral Daily  . docusate sodium  100 mg Oral Daily  . feeding supplement  237 mL Oral BID BM  . heparin  5,000 Units Subcutaneous Q8H  . metoprolol tartrate  75 mg Oral BID  . ondansetron (ZOFRAN) IV  4 mg Intravenous Q8H  . pantoprazole  40 mg Oral Daily  . predniSONE  20 mg Oral Q breakfast   Followed by  . [START ON 11/23/2019] predniSONE  10 mg Oral Q breakfast   Followed by  . [START ON 11/24/2019] predniSONE  5 mg Oral Q breakfast  . sodium chloride flush  3 mL Intravenous Q12H  . sodium chloride flush  3 mL Intravenous Q12H  . spironolactone  12.5 mg Oral Daily   Continuous  Infusions: . sodium chloride    . sodium chloride     PRN Meds: sodium chloride, sodium chloride, acetaminophen, bisacodyl, polyethylene glycol, sodium chloride flush, sodium chloride flush, traMADol   Vital Signs    Vitals:   11/21/19 1341 11/21/19 2054 11/21/19 2148 11/22/19 0415  BP: (!) 121/51 (!) 156/79 (!) 145/80 (!) 166/87  Pulse: 81 (!) 117 93 (!) 104  Resp: 16 (!) 23 19 (!) 26  Temp: 97.8 F (36.6 C) 98.5 F (36.9 C) 98.4 F (36.9 C) 97.6 F (36.4 C)  TempSrc: Oral Oral Oral Oral  SpO2: 91% 93%  90%  Weight:    60.3 kg  Height:        Intake/Output Summary (Last 24 hours) at 11/22/2019 0839 Last data filed at 11/22/2019 0600 Gross per 24 hour  Intake --  Output 1250 ml  Net -1250 ml   Last 3 Weights 11/22/2019 11/21/2019 11/20/2019  Weight (lbs) 132 lb 15 oz 120 lb 5.9 oz 129 lb 3.2 oz  Weight (kg) 60.3 kg 54.6 kg 58.605 kg      Telemetry    SR, episodes of ST - Personally Reviewed  ECG    No new tracing this morning.  Physical Exam  Pleasant older WF, sitting up in bed GEN: No acute distress.   Neck: No JVD Cardiac: RRR, no murmurs, rubs, or gallops.  Respiratory: Clear to auscultation bilaterally. GI: Soft, nontender,  non-distended  MS: No edema; No deformity.  Neuro:  Nonfocal  Psych: Normal affect   Labs    High Sensitivity Troponin:   Recent Labs  Lab 11/18/19 0620 11/18/19 0706  TROPONINIHS 2,740* 1,913*      Chemistry Recent Labs  Lab 11/18/19 0247 11/20/19 1108 11/22/19 0322  NA 139 136 136  K 5.2* 4.6 4.5  CL 106 103 103  CO2 23 24 25   GLUCOSE 124* 107* 103*  BUN 21 30* 30*  CREATININE 1.26* 1.22* 1.11*  CALCIUM 8.6* 9.1 9.5  GFRNONAA 38* 40* 44*  ANIONGAP 10 9 8      Hematology Recent Labs  Lab 11/17/19 1731 11/17/19 2029 11/20/19 1108  WBC  --  9.1 12.6*  RBC  --  3.65* 3.55*  HGB 12.2  12.2 11.4* 11.2*  HCT 36.0  36.0 35.5* 35.0*  MCV  --  97.3 98.6  MCH  --  31.2 31.5  MCHC  --  32.1 32.0  RDW  --   12.9 13.1  PLT  --  225 270    BNP Recent Labs  Lab 11/21/19 1248  BNP 1,081.4*     DDimer  Recent Labs  Lab 11/21/19 0809  DDIMER 3.62*     Radiology    DG CHEST PORT 1 VIEW  Result Date: 11/22/2019 CLINICAL DATA:  Respiratory distress. EXAM: PORTABLE CHEST 1 VIEW COMPARISON:  11/17/2019.  10/18/2019. FINDINGS: Prior CABG and cardiac valve replacement. Cardiomegaly. Diffuse bilateral pulmonary interstitial prominence again noted without interim change. Stable bilateral pleural thickening. No prominent pleural effusion. No pneumothorax. Diffuse osteopenia. Degenerative change thoracic spine. Surgical wiring noted of the left clavicle. Old left clavicular fracture. IMPRESSION: 1. Prior CABG and cardiac valve replacement. Stable cardiomegaly. 2. Diffuse bilateral pulmonary interstitial prominence again noted without interim change. Interstitial edema and/or pneumonitis could present this fashion. Electronically Signed   By: Marcello Moores  Register   On: 11/22/2019 05:11   ECHOCARDIOGRAM COMPLETE  Result Date: 11/21/2019    ECHOCARDIOGRAM REPORT   Patient Name:   Olivia [name] Date of Exam: 11/21/2019 Medical Rec #:  [id number]  Height:       62.0 in Accession #:    [id number] Weight:       120.4 lb Date of Birth:  May 08, 1929   BSA:          1.541 m Patient Age:    86 years   BP:           121/51 mmHg Patient Gender: F          HR:           84 bpm. Exam Location:  Inpatient Procedure: 2D Echo, Color Doppler and Cardiac Doppler Indications:    P50.93 Acute diastolic (congestive) heart failure  History:        Patient has prior history of Echocardiogram examinations, most                 recent 08/26/2016. Risk Factors:Hypertension and Dyslipidemia.                 Prior performed at [facility]                 History of CABG and MV Repair (with ring?) in 2008.  Sonographer:    [place] Referring Phys: [place]  Sonographer Comments: Technically difficult due to small rib spacing. IMPRESSIONS   1. Left ventricular ejection fraction, by estimation, is 35 to 40%. The left ventricle  has moderately decreased function. The left ventricle demonstrates regional wall motion abnormalities (see scoring diagram/findings for description). Left ventricular  diastolic parameters are indeterminate.  2. Right ventricular systolic function is normal. The right ventricular size is normal. There is severely elevated pulmonary artery systolic pressure. The estimated right ventricular systolic pressure is 78.6 mmHg.  3. Left atrial size was mildly dilated.  4. ERO 22 mm2; R-Vol 35 mL. The mitral valve is myxomatous. Mild to moderate mitral valve regurgitation. The mean mitral valve gradient is 7.5 mmHg with average heart rate of 90 bpm. Moderate mitral annular calcification.  5. The aortic valve was not well visualized. Aortic valve regurgitation is moderate to severe. Mild aortic valve stenosis. Aortic regurgitation PHT measures 211 msec. Aortic valve mean gradient measures 11.0 mmHg. Comparison(s): No prior Echocardiogram. Conclusion(s)/Recommendation(s): Reduced LVEF, with WMAs; Mild-moderate MR with Elevated MV gradients and PASP 60+ mmHg with Mitral Annular Calcification. Moderate to severe AI without LV dilation, with mild AS. Multiple abnormalities noted. FINDINGS  Left Ventricle: Left ventricular ejection fraction, by estimation, is 35 to 40%. The left ventricle has moderately decreased function. The left ventricle demonstrates regional wall motion abnormalities. The left ventricular internal cavity size was normal in size. There is no left ventricular hypertrophy. Left ventricular diastolic parameters are indeterminate.  LV Wall Scoring: The antero-lateral wall and posterior wall are hypokinetic. Globally down with lateral mid, base most prominent. Right Ventricle: The right ventricular size is normal. No increase in right ventricular wall thickness. Right ventricular systolic function is normal. There is severely  elevated pulmonary artery systolic pressure. The tricuspid regurgitant velocity is 3.86 m/s, and with an assumed right atrial pressure of 8 mmHg, the estimated right ventricular systolic pressure is 76.7 mmHg. Left Atrium: Left atrial size was mildly dilated. Right Atrium: Right atrial size was normal in size. Pericardium: There is no evidence of pericardial effusion. Mitral Valve: ERO 22 mm2; R-Vol 35 mL. The mitral valve is myxomatous. Moderate mitral annular calcification. Mild to moderate mitral valve regurgitation. MV peak gradient, 17.9 mmHg. The mean mitral valve gradient is 7.5 mmHg with average heart rate of 90 bpm. Tricuspid Valve: The tricuspid valve is grossly normal. Tricuspid valve regurgitation is trivial. Aortic Valve: The aortic valve was not well visualized. Aortic valve regurgitation is moderate to severe. Aortic regurgitation PHT measures 211 msec. Mild aortic stenosis is present. Aortic valve mean gradient measures 11.0 mmHg. Aortic valve peak gradient measures 19.0 mmHg. Aortic valve area, by VTI measures 0.82 cm. Pulmonic Valve: The pulmonic valve was grossly normal. Pulmonic valve regurgitation is not visualized. Aorta: The aortic root is normal in size and structure and the ascending aorta was not well visualized. Venous: The pulmonary veins were not well visualized. IAS/Shunts: The atrial septum is grossly normal.  LEFT VENTRICLE PLAX 2D LVIDd:         5.50 cm      Diastology LVIDs:         5.00 cm      LV e' medial:    3.81 cm/s LV PW:         0.70 cm      LV E/e' medial:  48.0 LV IVS:        0.70 cm      LV e' lateral:   3.59 cm/s LVOT diam:     1.80 cm      LV E/e' lateral: 51.0 LV SV:         41 LV SV Index:   26 LVOT  Area:     2.54 cm  LV Volumes (MOD) LV vol d, MOD A2C: 129.0 ml LV vol d, MOD A4C: 142.0 ml LV vol s, MOD A2C: 85.1 ml LV vol s, MOD A4C: 80.5 ml LV SV MOD A2C:     43.9 ml LV SV MOD A4C:     142.0 ml LV SV MOD BP:      55.5 ml RIGHT VENTRICLE RV S prime:     8.05 cm/s  TAPSE (M-mode): 1.2 cm LEFT ATRIUM             Index       RIGHT ATRIUM           Index LA diam:        3.50 cm 2.27 cm/m  RA Area:     17.30 cm LA Vol (A2C):   57.0 ml 37.00 ml/m RA Volume:   42.90 ml  27.85 ml/m LA Vol (A4C):   61.2 ml 39.72 ml/m LA Biplane Vol: 59.8 ml 38.81 ml/m  AORTIC VALVE AV Area (Vmax):    0.93 cm AV Area (Vmean):   0.88 cm AV Area (VTI):     0.82 cm AV Vmax:           218.00 cm/s AV Vmean:          160.000 cm/s AV VTI:            0.497 m AV Peak Grad:      19.0 mmHg AV Mean Grad:      11.0 mmHg LVOT Vmax:         79.40 cm/s LVOT Vmean:        55.200 cm/s LVOT VTI:          0.160 m LVOT/AV VTI ratio: 0.32 AI PHT:            211 msec  AORTA Ao Root diam: 2.50 cm MITRAL VALVE                 TRICUSPID VALVE MV Area (PHT): 3.53 cm      TR Peak grad:   59.6 mmHg MV Peak grad:  17.9 mmHg     TR Vmax:        386.00 cm/s MV Mean grad:  7.5 mmHg MV Vmax:       2.12 m/s      SHUNTS MV Vmean:      126.2 cm/s    Systemic VTI:  0.16 m MV Decel Time: 215 msec      Systemic Diam: 1.80 cm MR Peak grad:    108.6 mmHg MR Mean grad:    76.0 mmHg MR Vmax:         521.00 cm/s MR Vmean:        415.0 cm/s MR PISA:         1.01 cm MR PISA Eff ROA: 7 mm MR PISA Radius:  0.40 cm MV E velocity: 183.00 cm/s MV A velocity: 108.00 cm/s MV E/A ratio:  1.69 Rudean Haskell MD Electronically signed by Rudean Haskell MD Signature Date/Time: 11/21/2019/5:12:23 PM    Final     Cardiac Studies   Cath: 11/17/19   Prox Cx to Mid Cx lesion is 99% stenosed. SVG to OM is occluded.  Prox LAD lesion is 70% stenosed. Mid LAD lesion is 75% stenosed. Patent LIMA to LAD.  Mid RCA lesion is 100% stenosed. Occluded SVG to RCA.  Severe three vessel disease.  The left ventricular ejection fraction is 35-45%  by visual estimate.  LV end diastolic pressure is normal.  There is mild aortic valve stenosis.  Ao sat 87%, PA sat 54%, PA pressure 54/20, mean PA 37 mm Hg, mean PCWP 23 mm Hg; CO 3.89 L/min;  CI 2.4  Hemodynamic findings consistent with mild pulmonary hypertension.  Severe PAD as well with heavily calcified right common femoral artery.   Ischemic cardiomyopathy, pulmonary hypertension.  Sudden onset of shortness of breath this morning while awaiting kidney stone procedure.  ? takotsubo given the wall motion abnormality, however, she does have severe CAD.   No good targets for PCI.  Continue medical therapy.   She is most bothered by shortness of breath and palpitations.  HR consistently above 115.   LVEDP 14 but she is quite hypoxemic.  Would check chest xray to eval for pneumonia.  Will give a trial dose of furosemide to see if her breathing improves.  Discussed findings with the daughter.    Diagnostic Dominance: Right   Echo: 11/21/19  IMPRESSIONS    1. Left ventricular ejection fraction, by estimation, is 35 to 40%. The  left ventricle has moderately decreased function. The left ventricle  demonstrates regional wall motion abnormalities (see scoring  diagram/findings for description). Left ventricular  diastolic parameters are indeterminate.  2. Right ventricular systolic function is normal. The right ventricular  size is normal. There is severely elevated pulmonary artery systolic  pressure. The estimated right ventricular systolic pressure is 71.0 mmHg.  3. Left atrial size was mildly dilated.  4. ERO 22 mm2; R-Vol 35 mL. The mitral valve is myxomatous. Mild to  moderate mitral valve regurgitation. The mean mitral valve gradient is 7.5  mmHg with average heart rate of 90 bpm. Moderate mitral annular  calcification.  5. The aortic valve was not well visualized. Aortic valve regurgitation  is moderate to severe. Mild aortic valve stenosis. Aortic regurgitation  PHT measures 211 msec. Aortic valve mean gradient measures 11.0 mmHg.   Patient Profile     86 y.o. female with history of CAD s/p CABG in 2008 and PCI afterwards, s/p mitral valve repair  with mitral valve ring in 2008, ischemic cardiomyopathy, hypertension, hyperlipidemia with statin intolerance, breast cancer s/p lumpectomy, aortic stenosis and kidney stone (pending lithotripsy) transfer from [facility] with symptoms concerning for angina.  Assessment & Plan    1. CAD: cath noted above with multi-vessel disease with no targets. Recommendation for continued medical therapy. Lithotripsy has now been postponed with the resumption of plavix and ASA this admission.  -- asa/plavix. Statin intolerance, consider adding Zetia prior to discharge  2. ICM: EF was 40-45% with basal to mid inferior wall and moderate hypokinesis of the mid anterior lateral and apical lateral wall and moderate AS back 10/2019. This admission EF noted at 35-40% with anterolateral/posterior hypokinesis. Suspect mixed etiology with underlying CAD along with Takotsubo presentation. -- Arlyce Harman 12.5mg  added yesterday. BNP 1081. Had episode of dyspnea with palpitations this morning which improved with IV lasix. Required NRB, now weaned down to 2L.  -- Net - 2.4L, weights do not appear accurate? -- continue BB, increase spiro to 25mg  daily. Suspect she may need daily lasix at discharge -- considered ACE/ARB but will hold for now with increase in spiro. Consider if renal function remains stable -- BMET in am  3. Elevated Ddimer: 3.6 yesterday. Difficult situation with advanced age/renal function/dye allergy but is at risk for PE as she has been sedentary though has been on DVT prophylaxis. Will hold on  additional diuresis today.  -- premedicate with solumedrol and benadryl  -- continue prednisone taper  4. Rash: significant improvement with prednisone taper. Suspect this was 2/2 dye  5. AS: mild on echo this admission. Severe AI with PASP 60+ mmHg  6. PAD: Heavy calcification on right common femoral artery during cardiac cath -- Continue aspirin and Plavix -- Work-up by primary cardiologist as outpatient --  Statin intolerance   For questions or updates, please contact [facility] Please consult [email or web address] for contact info under        Signed, Reino Bellis, NP  11/22/2019, 8:39 AM

## 2019-11-22 NOTE — Progress Notes (Addendum)
ANTICOAGULATION CONSULT NOTE - Initial Consult  Pharmacy Consult for heparin  Indication: possible VTE  Allergies  Allergen Reactions  . Iodinated Diagnostic Agents Rash  . Ticagrelor Rash  . Statins Other (See Comments)    myalgias     Patient Measurements: Height: 5\' 2"  (157.5 cm) Weight: 60.3 kg (132 lb 15 oz) IBW/kg (Calculated) : 50.1   Vital Signs: Temp: 97.6 F (36.4 C) (10/20 0415) Temp Source: Oral (10/20 0415) BP: 166/87 (10/20 0415) Pulse Rate: 104 (10/20 0415)  Labs: Recent Labs    11/20/19 1108 11/22/19 0322  HGB 11.2*  --   HCT 35.0*  --   PLT 270  --   CREATININE 1.22* 1.11*    Estimated Creatinine Clearance: 30 mL/min (A) (by C-G formula based on SCr of 1.11 mg/dL (H)).   Medical History: Past Medical History:  Diagnosis Date  . Breast cancer ([place])   . CAD (coronary artery disease) hx of cabg  . Chronic systolic heart failure ([place])   . History of mitral valve repair 2008  . Hx of CABG 2008  . Hyperlipidemia with target LDL less than 70   . Hypertension   . PVD (peripheral vascular disease) ([facility])     Medications:  Medications Prior to Admission  Medication Sig Dispense Refill Last Dose  . albuterol (VENTOLIN HFA) 108 (90 Base) MCG/ACT inhaler Inhale 1 puff into the lungs every 6 (six) hours as needed for shortness of breath.   unk at prn  . clopidogrel (PLAVIX) 75 MG tablet Take 75 mg by mouth daily.   11/16/2019 at 0800  . furosemide (LASIX) 20 MG tablet Take 20 mg by mouth daily as needed for fluid.   unk at prn  . levothyroxine (SYNTHROID) 50 MCG tablet Take 50 mcg by mouth daily.   11/16/2019 at Unknown time  . lisinopril (ZESTRIL) 5 MG tablet Take 5 mg by mouth daily.   11/16/2019 at Unknown time  . metoprolol tartrate (LOPRESSOR) 50 MG tablet Take 50 mg by mouth in the morning and at bedtime.   11/17/2019 at 0430  . nitroGLYCERIN (NITROSTAT) 0.4 MG SL tablet Place 0.4 mg under the tongue every 5 (five) minutes as needed for chest  pain.   never at prn  . ondansetron (ZOFRAN-ODT) 4 MG disintegrating tablet Take 4 mg by mouth every 6 (six) hours as needed for nausea or vomiting.    Past Week at prn  . pantoprazole (PROTONIX) 40 MG tablet Take 40 mg by mouth daily.   11/16/2019 at Unknown time  . Red Yeast Rice Extract (RED YEAST RICE PO) Take 1 tablet by mouth at bedtime.   11/16/2019 at Unknown time  . tamsulosin (FLOMAX) 0.4 MG CAPS capsule Take 0.4 mg by mouth daily.   11/09/2019   Scheduled:  . aspirin EC  81 mg Oral Daily  . clopidogrel  75 mg Oral Daily  . docusate sodium  100 mg Oral Daily  . feeding supplement  237 mL Oral BID BM  . levothyroxine  50 mcg Oral Q0600  . metoprolol tartrate  75 mg Oral BID  . ondansetron  4 mg Oral Q8H  . pantoprazole  40 mg Oral Daily  . [START ON 11/23/2019] predniSONE  10 mg Oral Q breakfast   Followed by  . [START ON 11/24/2019] predniSONE  5 mg Oral Q breakfast  . sodium chloride flush  3 mL Intravenous Q12H  . sodium chloride flush  3 mL Intravenous Q12H  . spironolactone  25  mg Oral Daily    Assessment: 86 yo female with SOB and elevated d-dimer. Pharmacy consulted to dose heparin for possible VTE.  -Hg= 11.2, plt= 270 -heparin sq given this am   Goal of Therapy:  Heparin level 0.3-0.7 units/ml Monitor platelets by anticoagulation protocol: Yes   Plan:   -no heparin bolus due to recent sq heparin -start heparin at 950 units/hr -Heparin level in 8 hours and daily wth CBC daily  Hildred Laser, PharmD Clinical Pharmacist **Pharmacist phone directory can now be found on [email or web address] ([email or web address]).  Listed under [place].

## 2019-11-22 NOTE — Progress Notes (Signed)
   11/22/19 0415  Assess: MEWS Score  Temp 97.6 F (36.4 C)  BP (!) 166/87  Pulse Rate (!) 104  ECG Heart Rate (!) 104  Resp (!) 26  SpO2 90 %  O2 Device Nasal Cannula  O2 Flow Rate (L/min) 2 L/min  Assess: MEWS Score  MEWS Temp 0  MEWS Systolic 0  MEWS Pulse 1  MEWS RR 2  MEWS LOC 0  MEWS Score 3  MEWS Score Color Yellow  Assess: if the MEWS score is Yellow or Red  Were vital signs taken at a resting state? Yes  Focused Assessment Change from prior assessment (see assessment flowsheet)  Early Detection of Sepsis Score *See Row Information* Low  MEWS guidelines implemented *See Row Information* No, other (Comment) (See progress note)  Treat  MEWS Interventions Escalated (See documentation below)  Pain Score 0  Escalate  MEWS: Escalate Yellow: discuss with charge nurse/RN and consider discussing with provider and RRT  Notify: Charge Nurse/RN  Name of Charge Nurse/RN Notified  (I am the Charge Nurse at this time)  Date Charge Nurse/RN Notified 11/22/19  Time Charge Nurse/RN Notified 0415  Notify: Provider  Provider Name/Title Dr Kalman Shan  Date Provider Notified 11/22/19  Time Provider Notified [phone]  Notification Type Page  Notification Reason Change in status  Response See new orders  Date of Provider Response 11/22/19  Time of Provider Response [phone]  Document  Patient Outcome Stabilized after interventions  Progress note created (see row info) Yes

## 2019-11-22 NOTE — Progress Notes (Addendum)
Pt c/o SOB. O2 sat on RA 85-87%, RR25. BP 167/82. Lungs w/crackles in the bases. O2 applied at 2L/[place]. Provider on call Dr Kalman Shan paged via [facility]. Jessie Foot, [place] 2nd page to Dr Kalman Shan for above-RR now 28-33 HR 110, O2 sat 90 on 3L/[place].Requested STAT PCXR and IV Lasix. Jessie Foot, RN   0505-IV Lasix 40mg  given at 0450 per order-PCXR just completed-pending. Pt remains w/RR 31 sats 91 on [place]. Pt switched to NRB for the time being for respiratory distress. Pt has voided 300 cc thus far. Jessie Foot, RN    [phone] down to 21, reports feeling much better, has voided almost 600cc  total. O2 sat 98-100 on NRB attempted to change back to [facility] but pt request to keep in on for a little longer. Will continue to monitor closely.Jessie Foot, [place] Pt placed back on [place] @ 2L. Tolerating well. RR 21 with sats 94-95. Pt denies SOB at this time. Will continue to monitor. Jessie Foot, RN

## 2019-11-22 NOTE — Care Management Important Message (Signed)
Important Message  Patient Details  Name: Olivia [name] MRN: [id number] Date of Birth: 19-Feb-1929   Medicare Important Message Given:  Yes     Shelda Altes 11/22/2019, 10:37 AM

## 2019-11-23 DIAGNOSIS — E43 Unspecified severe protein-calorie malnutrition: Secondary | ICD-10-CM

## 2019-11-23 DIAGNOSIS — I5021 Acute systolic (congestive) heart failure: Secondary | ICD-10-CM | POA: Diagnosis not present

## 2019-11-23 DIAGNOSIS — N183 Chronic kidney disease, stage 3 unspecified: Secondary | ICD-10-CM

## 2019-11-23 DIAGNOSIS — N179 Acute kidney failure, unspecified: Secondary | ICD-10-CM | POA: Diagnosis not present

## 2019-11-23 DIAGNOSIS — R778 Other specified abnormalities of plasma proteins: Secondary | ICD-10-CM | POA: Diagnosis not present

## 2019-11-23 HISTORY — DX: Unspecified severe protein-calorie malnutrition: E43

## 2019-11-23 LAB — BASIC METABOLIC PANEL
Anion gap: 8 (ref 5–15)
BUN: 37 mg/dL — ABNORMAL HIGH (ref 8–23)
CO2: 28 mmol/L (ref 22–32)
Calcium: 9.4 mg/dL (ref 8.9–10.3)
Chloride: 100 mmol/L (ref 98–111)
Creatinine, Ser: 1.24 mg/dL — ABNORMAL HIGH (ref 0.44–1.00)
GFR, Estimated: 39 mL/min — ABNORMAL LOW (ref >60)
Glucose, Bld: 101 mg/dL — ABNORMAL HIGH (ref 70–99)
Potassium: 4.8 mmol/L (ref 3.5–5.1)
Sodium: 136 mmol/L (ref 135–145)

## 2019-11-23 MED ORDER — FUROSEMIDE 10 MG/ML IJ SOLN
INTRAMUSCULAR | Status: AC
  Administered 2019-11-23: 40 mg via INTRAVENOUS
  Filled 2019-11-23: qty 4

## 2019-11-23 MED ORDER — FUROSEMIDE 10 MG/ML IJ SOLN: 40.0000 mg | Freq: Once | INTRAMUSCULAR | Status: AC

## 2019-11-23 NOTE — Plan of Care (Signed)

## 2019-11-23 NOTE — Progress Notes (Signed)
Physical Therapy Treatment Patient Details Name: Olivia [name] MRN: [id number] DOB: Dec 01, 1929 Today's Date: 11/23/2019    History of Present Illness Pt transferred from [facility] for heart cath after repeated admissions for SOB and incr troponins. Cath found severe CAD with no good targets for PCI and to be treated medically. PMH - CAD, CABG, PCI, MVR, ishemic cardiomyopathy, HTN, breast CA, aortic stenosis, kidney stone with pending lithotripsy.     PT Comments    Pt doing well with mobility. Able to maintain >90% SpO2 on RA. Pt left on RA with nurse aware.    Follow Up Recommendations  No PT follow up     Equipment Recommendations  None recommended by PT    Recommendations for Other Services       Precautions / Restrictions Precautions Precautions: Fall    Mobility  Bed Mobility Overal bed mobility: Modified Independent                Transfers Overall transfer level: Needs assistance Equipment used: Rolling walker (2 wheeled) Transfers: Sit to/from Stand Sit to Stand: Supervision         General transfer comment: Incr time to rise but no physical assist  Ambulation/Gait Ambulation/Gait assistance: Supervision Gait Distance (Feet): 100 Feet Assistive device: Rolling walker (2 wheeled) Gait Pattern/deviations: Step-through pattern;Decreased stride length Gait velocity: normal for age Gait velocity interpretation: >2.62 ft/sec, indicative of community ambulatory General Gait Details: Limited distance due to pt received lasix and didn't want to amb too far from bathroom. Good use of walker   Stairs             Wheelchair Mobility    Modified Rankin (Stroke Patients Only)       Balance Overall balance assessment: Mild deficits observed, not formally tested                                          Cognition Arousal/Alertness: Awake/alert Behavior During Therapy: WFL for tasks assessed/performed Overall Cognitive  Status: Within Functional Limits for tasks assessed                                        Exercises      General Comments General comments (skin integrity, edema, etc.): SpO2 > 90% on RA with amb. HR 90's.       Pertinent Vitals/Pain Pain Assessment: 0-10 Pain Score: 4  Pain Location: lt flank Pain Descriptors / Indicators: Aching Pain Intervention(s): Limited activity within patient's tolerance;Premedicated before session    Home Living                      Prior Function            PT Goals (current goals can now be found in the care plan section) Acute Rehab PT Goals Patient Stated Goal: return home Progress towards PT goals: Progressing toward goals    Frequency    Min 2X/week      PT Plan Current plan remains appropriate    Co-evaluation              AM-PAC PT "6 Clicks" Mobility   Outcome Measure  Help needed turning from your back to your side while in a flat bed without using bedrails?: None Help needed moving from  lying on your back to sitting on the side of a flat bed without using bedrails?: None Help needed moving to and from a bed to a chair (including a wheelchair)?: None Help needed standing up from a chair using your arms (e.g., wheelchair or bedside chair)?: None Help needed to walk in hospital room?: A Little Help needed climbing 3-5 steps with a railing? : A Little 6 Click Score: 22    End of Session   Activity Tolerance: Patient tolerated treatment well Patient left: with call bell/phone within reach;in bed;with bed alarm set Nurse Communication: Mobility status PT Visit Diagnosis: Other abnormalities of gait and mobility (R26.89)     Time: 4784-1282 PT Time Calculation (min) (ACUTE ONLY): 27 min  Charges:  $Gait Training: 8-22 mins                     Stanley Pager [phone] Office [place] 11/23/2019, 9:08 AM

## 2019-11-23 NOTE — Progress Notes (Addendum)
The patient has been seen in conjunction with Harlan Stains, NP. All aspects of care have been considered and discussed. The patient has been personally interviewed, examined, and all clinical data has been reviewed.   Breathing better this morning.  Good diuresis.  Acute on chronic kidney disease stage IIIb with acute kidney injury creatinine rising to 1.24.  Got diuretic dose this morning.  Hold further diuretics until be met reevaluated in a.m.  Close to discharge.  Will be in hospital at least 24 more hours at which time we will need to make a decision about chronic diuretic dose.   Progress Note  Patient Name: Olivia [name] Date of Encounter: 11/23/2019  [facility] Cardiologist: Berniece Salines, DO   Subjective   Breathing is stable this morning. Eating breakfast.   Inpatient Medications    Scheduled Meds: . aspirin EC  81 mg Oral Daily  . clopidogrel  75 mg Oral Daily  . docusate sodium  100 mg Oral Daily  . enoxaparin (LOVENOX) injection  30 mg Subcutaneous Q24H  . feeding supplement  237 mL Oral BID BM  . furosemide  40 mg Intravenous Once  . levothyroxine  50 mcg Oral Q0600  . metoprolol tartrate  75 mg Oral BID  . ondansetron  4 mg Oral Q8H  . pantoprazole  40 mg Oral Daily  . predniSONE  10 mg Oral Q breakfast   Followed by  . [START ON 11/24/2019] predniSONE  5 mg Oral Q breakfast  . sodium chloride flush  3 mL Intravenous Q12H  . sodium chloride flush  3 mL Intravenous Q12H  . spironolactone  25 mg Oral Daily   Continuous Infusions: . sodium chloride    . sodium chloride     PRN Meds: sodium chloride, sodium chloride, acetaminophen, bisacodyl, polyethylene glycol, sodium chloride flush, sodium chloride flush, traMADol   Vital Signs    Vitals:   11/22/19 2141 11/23/19 0518 11/23/19 0651 11/23/19 0748  BP:      Pulse:  77    Resp: (!) 22  (!) 21   Temp:  98.2 F (36.8 C)    TempSrc:  Oral    SpO2: 98%  98% 98%  Weight:  57.4 kg    Height:          Intake/Output Summary (Last 24 hours) at 11/23/2019 0820 Last data filed at 11/23/2019 0606 Gross per 24 hour  Intake 627.56 ml  Output 1150 ml  Net -522.44 ml   Last 3 Weights 11/23/2019 11/22/2019 11/21/2019  Weight (lbs) 126 lb 9.6 oz 132 lb 15 oz 120 lb 5.9 oz  Weight (kg) 57.425 kg 60.3 kg 54.6 kg      Telemetry    SR, episodes of ST- Personally Reviewed  ECG    No new tracing  Physical Exam  Pleasant older female, sitting up eating breakfast GEN: No acute distress.   Neck: No JVD Cardiac: RRR, + systolic murmur, no rubs, or gallops.  Respiratory: Diminished in lower lobes GI: Soft, nontender, non-distended  MS: No edema; No deformity. Neuro:  Nonfocal  Psych: Normal affect   Labs    High Sensitivity Troponin:   Recent Labs  Lab 11/18/19 0620 11/18/19 0706  TROPONINIHS 2,740* 1,913*      Chemistry Recent Labs  Lab 11/20/19 1108 11/22/19 0322 11/23/19 0231  NA 136 136 136  K 4.6 4.5 4.8  CL 103 103 100  CO2 $Re'24 25 28  'kGe$ GLUCOSE 107* 103* 101*  BUN 30*  30* 37*  CREATININE 1.22* 1.11* 1.24*  CALCIUM 9.1 9.5 9.4  GFRNONAA 40* 44* 39*  ANIONGAP $RemoveB'9 8 8     'thcgeLhF$ Hematology Recent Labs  Lab 11/17/19 1731 11/17/19 2029 11/20/19 1108  WBC  --  9.1 12.6*  RBC  --  3.65* 3.55*  HGB 12.2  12.2 11.4* 11.2*  HCT 36.0  36.0 35.5* 35.0*  MCV  --  97.3 98.6  MCH  --  31.2 31.5  MCHC  --  32.1 32.0  RDW  --  12.9 13.1  PLT  --  225 270    BNP Recent Labs  Lab 11/21/19 1248  BNP 1,081.4*     DDimer  Recent Labs  Lab 11/21/19 0809  DDIMER 3.62*     Radiology    CT ANGIO CHEST PE W OR WO CONTRAST  Result Date: 11/22/2019 CLINICAL DATA:  Shortness of breath with elevated D-dimer levels. Low intermediate probability for pulmonary embolism. EXAM: CT ANGIOGRAPHY CHEST WITH CONTRAST TECHNIQUE: Multidetector CT imaging of the chest was performed using the standard protocol during bolus administration of intravenous contrast. Multiplanar CT image  reconstructions and MIPs were obtained to evaluate the vascular anatomy. The patient was premedicated according to the 13hour steroid and Benadryl prep due to a history of iodinated contrast allergy. There were no immediate complications. CONTRAST:  57mL OMNIPAQUE IOHEXOL 350 MG/ML SOLN COMPARISON:  Chest CT 12/20/2006.  Radiographs 11/22/2019. FINDINGS: Cardiovascular: The pulmonary arteries are extremely well opacified with contrast to the level of the subsegmental branches. There is no evidence of acute pulmonary embolism. There is very limited opacification of the systemic arteries. There is extensive atherosclerosis of the aorta, great vessels and coronary arteries post median sternotomy and CABG. 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 Aortic valvular calcifications are present. The heart is mildly enlarged. No significant pericardial fluid. Mediastinum/Nodes: There are no enlarged mediastinal, hilar or axillary lymph nodes. The thyroid gland, trachea and esophagus demonstrate no significant findings. Lungs/Pleura: There are moderate-sized dependent pleural effusions bilaterally with associated dependent atelectasis in both lungs. Minimal patchy ground-glass opacities within the aerated portions of lungs may reflect mild edema. There is no suspicious pulmonary nodularity. Upper abdomen: Reflux of contrast into the IVC and hepatic veins. Right-sided hydronephrosis and a hyperdense lesion in the upper pole of the right kidney measuring 19 mm on image 135/5 are again noted, unchanged from abdominal CT 11/04/2019. This renal lesion is not further characterized. Musculoskeletal/Chest wall: There is no chest wall mass or suspicious osseous finding. Thoracic kyphoscoliosis and chronic deformities of the T3 and T4 vertebral bodies appear unchanged. Review of the MIP images confirms the above findings. IMPRESSION: 1. No evidence of acute pulmonary embolism. 2. Moderate-sized dependent pleural effusions bilaterally with  associated dependent atelectasis in both lungs. The pleural effusions have developed over the last 3 weeks. Minimal patchy ground-glass opacities within the aerated portions of lungs may reflect mild edema. 3. Cardiomegaly with reflux of contrast into the IVC and hepatic veins suggesting elevated right heart pressures. 4. Stable right-sided hydronephrosis and a hyperdense lesion in the upper pole of the right kidney, not further characterized by this study. See abdominal CT report and recommendations 11/04/2019. 5. Aortic Atherosclerosis (ICD10-I70.0). Electronically Signed   By: Richardean Sale M.D.   On: 11/22/2019 12:47   DG CHEST PORT 1 VIEW  Result Date: 11/22/2019 CLINICAL DATA:  Respiratory distress. EXAM: PORTABLE CHEST 1 VIEW COMPARISON:  11/17/2019.  10/18/2019. FINDINGS: Prior CABG and cardiac valve replacement. Cardiomegaly. Diffuse bilateral pulmonary  interstitial prominence again noted without interim change. Stable bilateral pleural thickening. No prominent pleural effusion. No pneumothorax. Diffuse osteopenia. Degenerative change thoracic spine. Surgical wiring noted of the left clavicle. Old left clavicular fracture. IMPRESSION: 1. Prior CABG and cardiac valve replacement. Stable cardiomegaly. 2. Diffuse bilateral pulmonary interstitial prominence again noted without interim change. Interstitial edema and/or pneumonitis could present this fashion. Electronically Signed   By: Marcello Moores  Register   On: 11/22/2019 05:11   ECHOCARDIOGRAM COMPLETE  Result Date: 11/21/2019    ECHOCARDIOGRAM REPORT   Patient Name:   Olivia [name] Date of Exam: 11/21/2019 Medical Rec #:  [id number]  Height:       62.0 in Accession #:    [id number] Weight:       120.4 lb Date of Birth:  February 21, 1929   BSA:          1.541 m Patient Age:    51 years   BP:           121/51 mmHg Patient Gender: F          HR:           84 bpm. Exam Location:  Inpatient Procedure: 2D Echo, Color Doppler and Cardiac Doppler Indications:    X38.18  Acute diastolic (congestive) heart failure  History:        Patient has prior history of Echocardiogram examinations, most                 recent 08/26/2016. Risk Factors:Hypertension and Dyslipidemia.                 Prior performed at [facility]                 History of CABG and MV Repair (with ring?) in 2008.  Sonographer:    [place] Referring Phys: [place]  Sonographer Comments: Technically difficult due to small rib spacing. IMPRESSIONS  1. Left ventricular ejection fraction, by estimation, is 35 to 40%. The left ventricle has moderately decreased function. The left ventricle demonstrates regional wall motion abnormalities (see scoring diagram/findings for description). Left ventricular  diastolic parameters are indeterminate.  2. Right ventricular systolic function is normal. The right ventricular size is normal. There is severely elevated pulmonary artery systolic pressure. The estimated right ventricular systolic pressure is 29.9 mmHg.  3. Left atrial size was mildly dilated.  4. ERO 22 mm2; R-Vol 35 mL. The mitral valve is myxomatous. Mild to moderate mitral valve regurgitation. The mean mitral valve gradient is 7.5 mmHg with average heart rate of 90 bpm. Moderate mitral annular calcification.  5. The aortic valve was not well visualized. Aortic valve regurgitation is moderate to severe. Mild aortic valve stenosis. Aortic regurgitation PHT measures 211 msec. Aortic valve mean gradient measures 11.0 mmHg. Comparison(s): No prior Echocardiogram. Conclusion(s)/Recommendation(s): Reduced LVEF, with WMAs; Mild-moderate MR with Elevated MV gradients and PASP 60+ mmHg with Mitral Annular Calcification. Moderate to severe AI without LV dilation, with mild AS. Multiple abnormalities noted. FINDINGS  Left Ventricle: Left ventricular ejection fraction, by estimation, is 35 to 40%. The left ventricle has moderately decreased function. The left ventricle demonstrates regional wall motion abnormalities.  The left ventricular internal cavity size was normal in size. There is no left ventricular hypertrophy. Left ventricular diastolic parameters are indeterminate.  LV Wall Scoring: The antero-lateral wall and posterior wall are hypokinetic. Globally down with lateral mid, base most prominent. Right Ventricle: The right ventricular size is normal. No increase in right  ventricular wall thickness. Right ventricular systolic function is normal. There is severely elevated pulmonary artery systolic pressure. The tricuspid regurgitant velocity is 3.86 m/s, and with an assumed right atrial pressure of 8 mmHg, the estimated right ventricular systolic pressure is 67.6 mmHg. Left Atrium: Left atrial size was mildly dilated. Right Atrium: Right atrial size was normal in size. Pericardium: There is no evidence of pericardial effusion. Mitral Valve: ERO 22 mm2; R-Vol 35 mL. The mitral valve is myxomatous. Moderate mitral annular calcification. Mild to moderate mitral valve regurgitation. MV peak gradient, 17.9 mmHg. The mean mitral valve gradient is 7.5 mmHg with average heart rate of 90 bpm. Tricuspid Valve: The tricuspid valve is grossly normal. Tricuspid valve regurgitation is trivial. Aortic Valve: The aortic valve was not well visualized. Aortic valve regurgitation is moderate to severe. Aortic regurgitation PHT measures 211 msec. Mild aortic stenosis is present. Aortic valve mean gradient measures 11.0 mmHg. Aortic valve peak gradient measures 19.0 mmHg. Aortic valve area, by VTI measures 0.82 cm. Pulmonic Valve: The pulmonic valve was grossly normal. Pulmonic valve regurgitation is not visualized. Aorta: The aortic root is normal in size and structure and the ascending aorta was not well visualized. Venous: The pulmonary veins were not well visualized. IAS/Shunts: The atrial septum is grossly normal.  LEFT VENTRICLE PLAX 2D LVIDd:         5.50 cm      Diastology LVIDs:         5.00 cm      LV e' medial:    3.81 cm/s LV PW:          0.70 cm      LV E/e' medial:  48.0 LV IVS:        0.70 cm      LV e' lateral:   3.59 cm/s LVOT diam:     1.80 cm      LV E/e' lateral: 51.0 LV SV:         41 LV SV Index:   26 LVOT Area:     2.54 cm  LV Volumes (MOD) LV vol d, MOD A2C: 129.0 ml LV vol d, MOD A4C: 142.0 ml LV vol s, MOD A2C: 85.1 ml LV vol s, MOD A4C: 80.5 ml LV SV MOD A2C:     43.9 ml LV SV MOD A4C:     142.0 ml LV SV MOD BP:      55.5 ml RIGHT VENTRICLE RV S prime:     8.05 cm/s TAPSE (M-mode): 1.2 cm LEFT ATRIUM             Index       RIGHT ATRIUM           Index LA diam:        3.50 cm 2.27 cm/m  RA Area:     17.30 cm LA Vol (A2C):   57.0 ml 37.00 ml/m RA Volume:   42.90 ml  27.85 ml/m LA Vol (A4C):   61.2 ml 39.72 ml/m LA Biplane Vol: 59.8 ml 38.81 ml/m  AORTIC VALVE AV Area (Vmax):    0.93 cm AV Area (Vmean):   0.88 cm AV Area (VTI):     0.82 cm AV Vmax:           218.00 cm/s AV Vmean:          160.000 cm/s AV VTI:            0.497 m AV Peak Grad:      19.0  mmHg AV Mean Grad:      11.0 mmHg LVOT Vmax:         79.40 cm/s LVOT Vmean:        55.200 cm/s LVOT VTI:          0.160 m LVOT/AV VTI ratio: 0.32 AI PHT:            211 msec  AORTA Ao Root diam: 2.50 cm MITRAL VALVE                 TRICUSPID VALVE MV Area (PHT): 3.53 cm      TR Peak grad:   59.6 mmHg MV Peak grad:  17.9 mmHg     TR Vmax:        386.00 cm/s MV Mean grad:  7.5 mmHg MV Vmax:       2.12 m/s      SHUNTS MV Vmean:      126.2 cm/s    Systemic VTI:  0.16 m MV Decel Time: 215 msec      Systemic Diam: 1.80 cm MR Peak grad:    108.6 mmHg MR Mean grad:    76.0 mmHg MR Vmax:         521.00 cm/s MR Vmean:        415.0 cm/s MR PISA:         1.01 cm MR PISA Eff ROA: 7 mm MR PISA Radius:  0.40 cm MV E velocity: 183.00 cm/s MV A velocity: 108.00 cm/s MV E/A ratio:  1.69 Riley Lam MD Electronically signed by Riley Lam MD Signature Date/Time: 11/21/2019/5:12:23 PM    Final     Cardiac Studies   Cath: 11/17/19   Prox Cx to Mid Cx lesion is 99%  stenosed. SVG to OM is occluded.  Prox LAD lesion is 70% stenosed. Mid LAD lesion is 75% stenosed. Patent LIMA to LAD.  Mid RCA lesion is 100% stenosed. Occluded SVG to RCA.  Severe three vessel disease.  The left ventricular ejection fraction is 35-45% by visual estimate.  LV end diastolic pressure is normal.  There is mild aortic valve stenosis.  Ao sat 87%, PA sat 54%, PA pressure 54/20, mean PA 37 mm Hg, mean PCWP 23 mm Hg; CO 3.89 L/min; CI 2.4  Hemodynamic findings consistent with mild pulmonary hypertension.  Severe PAD as well with heavily calcified right common femoral artery.  Ischemic cardiomyopathy, pulmonary hypertension. Sudden onset of shortness of breath this morning while awaiting kidney stone procedure. ? takotsubo given the wall motion abnormality, however, she does have severe CAD.   No good targets for PCI. Continue medical therapy.   She is most bothered by shortness of breath and palpitations. HR consistently above 115.  LVEDP 14 but she is quite hypoxemic. Would check chest xray to eval for pneumonia. Will give a trial dose of furosemide to see if her breathing improves.  Discussed findings with the daughter.   Diagnostic Dominance: Right   Echo: 11/21/19  IMPRESSIONS    1. Left ventricular ejection fraction, by estimation, is 35 to 40%. The  left ventricle has moderately decreased function. The left ventricle  demonstrates regional wall motion abnormalities (see scoring  diagram/findings for description). Left ventricular  diastolic parameters are indeterminate.  2. Right ventricular systolic function is normal. The right ventricular  size is normal. There is severely elevated pulmonary artery systolic  pressure. The estimated right ventricular systolic pressure is 67.6 mmHg.  3. Left atrial size was mildly dilated.  4. ERO  22 mm2; R-Vol 35 mL. The mitral valve is myxomatous. Mild to  moderate mitral valve regurgitation. The  mean mitral valve gradient is 7.5  mmHg with average heart rate of 90 bpm. Moderate mitral annular  calcification.  5. The aortic valve was not well visualized. Aortic valve regurgitation  is moderate to severe. Mild aortic valve stenosis. Aortic regurgitation  PHT measures 211 msec. Aortic valve mean gradient measures 11.0 mmHg.   Patient Profile     86 y.o. female with history of CAD s/p CABG in 2008 and PCI afterwards, s/p mitral valve repair with mitral valve ring in 2008, ischemic cardiomyopathy, hypertension, hyperlipidemia with statin intolerance, breast cancer s/p lumpectomy, aortic stenosis and kidney stone (pending lithotripsy) transfer from [facility] with symptoms concerning for angina.  Assessment & Plan    1. CAD: cath noted above with multi-vessel disease with no targets. Recommendation for continued medical therapy. Lithotripsy has now been postponed with the resumption of plavix and ASA this admission.  -- asa/plavix. Statin intolerance, consider adding Zetia prior to discharge  2. ICM: EF was 40-45% with basal to mid inferior wall and moderate hypokinesis of the mid anterior lateral and apical lateral wall and moderate AS back 10/2019. This admission EF noted at 35-40% with anterolateral/posterior hypokinesis. Suspect mixed etiology with underlying CAD along with Takotsubo presentation. Her O2 was off briefly while I was in the room and she maintained her sats appropriately.  -- will dose IV lasix $Remove'40mg'BgddQcx$  today -- BNP 1081. Net - 3L, weights down to 126lbs -- continue BB, spiro $RemoveB'25mg'EpzDIbVW$  daily. Suspect she may need daily lasix at discharge -- considered ACE/ARB but will hold for with recent contrast with mild increased in Cr. Consider if renal function remains stable -- BMET in am  3. Elevated Ddimer: 3.6, CT angio was negative for PE, noted bilateral moderate pleural effusions. -- continue prednisone taper  4. Rash: significant improvement with prednisone taper. Suspect  this was 2/2 dye  5. AS: mild on echo this admission. Severe AI with PASP 60+ mmHg  6. PAD: Heavy calcification on right common femoral artery during cardiac cath -- Continue aspirin and Plavix -- Work-up by primary cardiologist as outpatient -- Statin intolerance    For questions or updates, please contact [facility] Please consult [email or web address] for contact info under        Signed, Reino Bellis, NP  11/23/2019, 8:20 AM

## 2019-11-24 ENCOUNTER — Other Ambulatory Visit: Payer: Self-pay | Admitting: Cardiology

## 2019-11-24 DIAGNOSIS — R0600 Dyspnea, unspecified: Secondary | ICD-10-CM | POA: Diagnosis not present

## 2019-11-24 DIAGNOSIS — Z79899 Other long term (current) drug therapy: Secondary | ICD-10-CM

## 2019-11-24 DIAGNOSIS — I5021 Acute systolic (congestive) heart failure: Secondary | ICD-10-CM | POA: Diagnosis not present

## 2019-11-24 LAB — BASIC METABOLIC PANEL
Anion gap: 9 (ref 5–15)
BUN: 42 mg/dL — ABNORMAL HIGH (ref 8–23)
CO2: 29 mmol/L (ref 22–32)
Calcium: 9.7 mg/dL (ref 8.9–10.3)
Chloride: 98 mmol/L (ref 98–111)
Creatinine, Ser: 1.31 mg/dL — ABNORMAL HIGH (ref 0.44–1.00)
GFR, Estimated: 39 mL/min — ABNORMAL LOW (ref >60)
Glucose, Bld: 97 mg/dL (ref 70–99)
Potassium: 4.4 mmol/L (ref 3.5–5.1)
Sodium: 136 mmol/L (ref 135–145)

## 2019-11-24 MED ORDER — SPIRONOLACTONE 25 MG PO TABS
12.5000 mg | ORAL_TABLET | Freq: Every day | ORAL | 0 refills | Status: DC
Start: 2019-11-25 — End: 2023-03-02

## 2019-11-24 MED ORDER — METOPROLOL TARTRATE 75 MG PO TABS: 75.0000 mg | ORAL_TABLET | Freq: Two times a day (BID) | ORAL | 1 refills | Status: DC

## 2019-11-24 MED ORDER — FUROSEMIDE 20 MG PO TABS
20.0000 mg | ORAL_TABLET | Freq: Every day | ORAL | 1 refills | Status: DC
Start: 2019-11-24 — End: 2020-02-01

## 2019-11-24 MED ORDER — ASPIRIN 81 MG PO TBEC
81.0000 mg | DELAYED_RELEASE_TABLET | Freq: Every day | ORAL | 11 refills | Status: DC
Start: 2019-11-25 — End: 2023-03-02

## 2019-11-24 NOTE — Discharge Summary (Addendum)
The patient has been seen in conjunction with Reino Bellis, NP. All aspects of care have been considered and discussed. The patient has been personally interviewed, examined, and all clinical data has been reviewed.   Please see my note comments from earlier today.  Ready for DC  Home diuretic regimen is Spironolactone 12.5 mg daily and furosemide 20 mg daily starting tomorrow.  BMET and F/U early next week.  Discharge Summary    Patient ID: Olivia [name] MRN: [id number]; DOB: 08/07/1929  Admit date: 11/17/2019 Discharge date: 11/24/2019  Primary Care Provider: Nicoletta Dress, MD  Primary Cardiologist: Olivia Salines, DO  Primary Electrophysiologist:  None   Discharge Diagnoses    Active Problems:   Acute systolic heart failure (HCC)   Elevated troponin   Protein-calorie malnutrition, severe   Dyspnea  Diagnostic Studies/Procedures    Cath: 11/17/19   Prox Cx to Mid Cx lesion is 99% stenosed. SVG to OM is occluded.  Prox LAD lesion is 70% stenosed. Mid LAD lesion is 75% stenosed. Patent LIMA to LAD.  Mid RCA lesion is 100% stenosed. Occluded SVG to RCA.  Severe three vessel disease.  The left ventricular ejection fraction is 35-45% by visual estimate.  LV end diastolic pressure is normal.  There is mild aortic valve stenosis.  Ao sat 87%, PA sat 54%, PA pressure 54/20, mean PA 37 mm Hg, mean PCWP 23 mm Hg; CO 3.89 L/min; CI 2.4  Hemodynamic findings consistent with mild pulmonary hypertension.  Severe PAD as well with heavily calcified right common femoral artery.  Ischemic cardiomyopathy, pulmonary hypertension. Sudden onset of shortness of breath this morning while awaiting kidney stone procedure. ? takotsubo given the wall motion abnormality, however, she does have severe CAD.   No good targets for PCI. Continue medical therapy.   She is most bothered by shortness of breath and palpitations. HR consistently above 115.  LVEDP 14 but she is  quite hypoxemic. Would check chest xray to eval for pneumonia. Will give a trial dose of furosemide to see if her breathing improves.  Discussed findings with the daughter.   Diagnostic Dominance: Right   Echo: 11/21/19  IMPRESSIONS    1. Left ventricular ejection fraction, by estimation, is 35 to 40%. The  left ventricle has moderately decreased function. The left ventricle  demonstrates regional wall motion abnormalities (see scoring  diagram/findings for description). Left ventricular  diastolic parameters are indeterminate.  2. Right ventricular systolic function is normal. The right ventricular  size is normal. There is severely elevated pulmonary artery systolic  pressure. The estimated right ventricular systolic pressure is 98.3 mmHg.  3. Left atrial size was mildly dilated.  4. ERO 22 mm2; R-Vol 35 mL. The mitral valve is myxomatous. Mild to  moderate mitral valve regurgitation. The mean mitral valve gradient is 7.5  mmHg with average heart rate of 90 bpm. Moderate mitral annular  calcification.  5. The aortic valve was not well visualized. Aortic valve regurgitation  is moderate to severe. Mild aortic valve stenosis. Aortic regurgitation  PHT measures 211 msec. Aortic valve mean gradient measures 11.0 mmHg.  _____________   History of Present Illness     Olivia [name] is a 86 y.o. female with  a history of CAD s/p CABG and MV repair in 2008 followed by PCI, HTN, HLD, chronic systolic heart failure, PVD, and breast cancer s/p lumpectomy. [name] was hospitalized in [facility] Sept 2021 with palpitations and SOB. Echo showed EF 40-45% with WMA  and moderate AS. Troponin was elevated. Options were discussed and medical management was felt to be the best decision for her. DAPT was continued. She is allergic to statins. Unfortunately, the patient presented back to [facility] 11/16/19 with worsening shortness of breath and found to have troponin 1.47. SOB  felt to be her anginal equivalent. Cardiology was consulted and decision was made to transfer her to [facility] for heart catheterization.   Workup prior to arrival: Hb 12.0 WBC 10.2 sCr 1.10 K 5.2 Pro-BNP 5140  Prior to arrival, patient was on lisinopril 5 mg, lopressor 50 mg BID, ASA, plavix, PRN lasix, and flomax.  On arrival, she is on heparin drip and ASA. She has a dye allergy and will receive 125 mg solumedrol and 25 mg benadryl. Per report, she did not receive plavix. She reported that she was still short of breath and very anxious. She had not slept in 3 nights. She denied frank chest pain.    Hospital Course     1. CAD: cath noted above with multi-vessel disease with no targets. Recommendation for continued medical therapy. Lithotripsy has now been postponed with the resumption of plavix and ASA this admission.  -- asa/plavix. Statin intolerance  2. ICM:EF was 40-45% with basal to mid inferior wall and moderate hypokinesis of the mid anterior lateral and apical lateral walland moderate AS back 10/2019. This admission EF noted at 35-40% with anterolateral/posterior hypokinesis. Suspect mixed etiology with underlying CAD along with Takotsubo presentation. Now maintaining her sats on RA this morning.  --BNP 1081. Net -2.8L, weightsdown to 124lbs after IV diuresis. -- continue BB,spiro12.5 mg daily. Was doing lasix 20mg  PRN prior to admission, will plan for 20mg  daily at discharge to be started on 10/23  3. Elevated Ddimer:3.6, CT angio 10/20 was negative for PE, noted bilateral moderate pleural effusions.   4. Rash: significant improvement with prednisone taper, completed day of discharge.  5. UE:AVWU on echo this admission. Severe AI with PASP 60+ mmHg  6. JWJ:XBJYN calcification on right common femoral artery during cardiac cath --Continue aspirin and Plavix. Statin intolerance  7. AKI: 2/2 to contrast and diuretic use. Cr 1.2>>1.31 this morning. Hold  additional diuresis the day of discharge. Plan for lasix 20mg  daily starting 10/23  -- BMET next week    Did the patient have an acute coronary syndrome (MI, NSTEMI, STEMI, etc) this admission?:  No                               Did the patient have a percutaneous coronary intervention (stent / angioplasty)?:  No.   _____________  Discharge Vitals Blood pressure (!) 125/47, pulse 65, temperature 98.1 F (36.7 C), temperature source Oral, resp. rate 14, height 5\' 2"  (1.575 m), weight 56.5 kg, SpO2 96 %.  Filed Weights   11/22/19 0415 11/23/19 0518 11/24/19 0403  Weight: 60.3 kg 57.4 kg 56.5 kg    Labs & Radiologic Studies    CBC No results for input(s): WBC, NEUTROABS, HGB, HCT, MCV, PLT in the last 72 hours. Basic Metabolic Panel Recent Labs    11/23/19 0231 11/24/19 0150  NA 136 136  K 4.8 4.4  CL 100 98  CO2 28 29  GLUCOSE 101* 97  BUN 37* 42*  CREATININE 1.24* 1.31*  CALCIUM 9.4 9.7   Liver Function Tests No results for input(s): AST, ALT, ALKPHOS, BILITOT, PROT, ALBUMIN in the last 72 hours. No results for input(s):  LIPASE, AMYLASE in the last 72 hours. High Sensitivity Troponin:   Recent Labs  Lab 11/18/19 0620 11/18/19 0706  TROPONINIHS 2,740* 1,913*    BNP Invalid input(s): POCBNP D-Dimer No results for input(s): DDIMER in the last 72 hours. Hemoglobin A1C No results for input(s): HGBA1C in the last 72 hours. Fasting Lipid Panel No results for input(s): CHOL, HDL, LDLCALC, TRIG, CHOLHDL, LDLDIRECT in the last 72 hours. Thyroid Function Tests No results for input(s): TSH, T4TOTAL, T3FREE, THYROIDAB in the last 72 hours.  Invalid input(s): FREET3 _____________  DG Chest 1 View  Result Date: 11/17/2019 CLINICAL DATA:  Dyspnea. EXAM: CHEST  1 VIEW COMPARISON:  11/17/2019 at 7:01 a.m., and earlier exams. FINDINGS: Interstitial areas of hazy airspace lung opacity are without significant change from the earlier study, consistent with a combination of  chronic findings with superimposed interstitial and hazy airspace pulmonary edema. Cardiac silhouette is enlarged with stable changes from prior cardiac surgery and valve replacement. Possible small effusions. No pneumothorax. IMPRESSION: 1. No significant change from the study obtained earlier today. Findings compatible congestive heart failure with interstitial hazy airspace pulmonary edema superimposed on chronic interstitial lung disease. Electronically Signed   By: Lajean Manes M.D.   On: 11/17/2019 19:21   CT ANGIO CHEST PE W OR WO CONTRAST  Result Date: 11/22/2019 CLINICAL DATA:  Shortness of breath with elevated D-dimer levels. Low intermediate probability for pulmonary embolism. EXAM: CT ANGIOGRAPHY CHEST WITH CONTRAST TECHNIQUE: Multidetector CT imaging of the chest was performed using the standard protocol during bolus administration of intravenous contrast. Multiplanar CT image reconstructions and MIPs were obtained to evaluate the vascular anatomy. The patient was premedicated according to the 13hour steroid and Benadryl prep due to a history of iodinated contrast allergy. There were no immediate complications. CONTRAST:  52mL OMNIPAQUE IOHEXOL 350 MG/ML SOLN COMPARISON:  Chest CT 12/20/2006.  Radiographs 11/22/2019. FINDINGS: Cardiovascular: The pulmonary arteries are extremely well opacified with contrast to the level of the subsegmental branches. There is no evidence of acute pulmonary embolism. There is very limited opacification of the systemic arteries. There is extensive atherosclerosis of the aorta, great vessels and coronary arteries post median sternotomy and CABG. 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 Aortic valvular calcifications are present. The heart is mildly enlarged. No significant pericardial fluid. Mediastinum/Nodes: There are no enlarged mediastinal, hilar or axillary lymph nodes. The thyroid gland, trachea and esophagus demonstrate no significant findings. Lungs/Pleura: There  are moderate-sized dependent pleural effusions bilaterally with associated dependent atelectasis in both lungs. Minimal patchy ground-glass opacities within the aerated portions of lungs may reflect mild edema. There is no suspicious pulmonary nodularity. Upper abdomen: Reflux of contrast into the IVC and hepatic veins. Right-sided hydronephrosis and a hyperdense lesion in the upper pole of the right kidney measuring 19 mm on image 135/5 are again noted, unchanged from abdominal CT 11/04/2019. This renal lesion is not further characterized. Musculoskeletal/Chest wall: There is no chest wall mass or suspicious osseous finding. Thoracic kyphoscoliosis and chronic deformities of the T3 and T4 vertebral bodies appear unchanged. Review of the MIP images confirms the above findings. IMPRESSION: 1. No evidence of acute pulmonary embolism. 2. Moderate-sized dependent pleural effusions bilaterally with associated dependent atelectasis in both lungs. The pleural effusions have developed over the last 3 weeks. Minimal patchy ground-glass opacities within the aerated portions of lungs may reflect mild edema. 3. Cardiomegaly with reflux of contrast into the IVC and hepatic veins suggesting elevated right heart pressures. 4. Stable right-sided hydronephrosis and a  hyperdense lesion in the upper pole of the right kidney, not further characterized by this study. See abdominal CT report and recommendations 11/04/2019. 5. Aortic Atherosclerosis (ICD10-I70.0). Electronically Signed   By: Richardean Sale M.D.   On: 11/22/2019 12:47   CARDIAC CATHETERIZATION  Result Date: 11/17/2019  Prox Cx to Mid Cx lesion is 99% stenosed. SVG to OM is occluded.  Prox LAD lesion is 70% stenosed. Mid LAD lesion is 75% stenosed. Patent LIMA to LAD.  Mid RCA lesion is 100% stenosed. Occluded SVG to RCA.  Severe three vessel disease.  The left ventricular ejection fraction is 35-45% by visual estimate.  LV end diastolic pressure is normal.   There is mild aortic valve stenosis.  Ao sat 87%, PA sat 54%, PA pressure 54/20, mean PA 37 mm Hg, mean PCWP 23 mm Hg; CO 3.89 L/min; CI 2.4  Hemodynamic findings consistent with mild pulmonary hypertension.  Severe PAD as well with heavily calcified right common femoral artery.  Ischemic cardiomyopathy, pulmonary hypertension.  Sudden onset of shortness of breath this morning while awaiting kidney stone procedure.  ? takotsubo given the wall motion abnormality, however, she does have severe CAD. No good targets for PCI.  Continue medical therapy. She is most bothered by shortness of breath and palpitations.  HR consistently above 115.  LVEDP 14 but she is quite hypoxemic.  Would check chest xray to eval for pneumonia.  Will give a trial dose of furosemide to see if her breathing improves. Discussed findings with the daughter.    DG CHEST PORT 1 VIEW  Result Date: 11/22/2019 CLINICAL DATA:  Respiratory distress. EXAM: PORTABLE CHEST 1 VIEW COMPARISON:  11/17/2019.  10/18/2019. FINDINGS: Prior CABG and cardiac valve replacement. Cardiomegaly. Diffuse bilateral pulmonary interstitial prominence again noted without interim change. Stable bilateral pleural thickening. No prominent pleural effusion. No pneumothorax. Diffuse osteopenia. Degenerative change thoracic spine. Surgical wiring noted of the left clavicle. Old left clavicular fracture. IMPRESSION: 1. Prior CABG and cardiac valve replacement. Stable cardiomegaly. 2. Diffuse bilateral pulmonary interstitial prominence again noted without interim change. Interstitial edema and/or pneumonitis could present this fashion. Electronically Signed   By: Marcello Moores  Register   On: 11/22/2019 05:11   ECHOCARDIOGRAM COMPLETE  Result Date: 11/21/2019    ECHOCARDIOGRAM REPORT   Patient Name:   Olivia [name] Date of Exam: 11/21/2019 Medical Rec #:  [id number]  Height:       62.0 in Accession #:    [id number] Weight:       120.4 lb Date of Birth:  [birth year]   BSA:           1.541 m Patient Age:    72 years   BP:           121/51 mmHg Patient Gender: F          HR:           84 bpm. Exam Location:  Inpatient Procedure: 2D Echo, Color Doppler and Cardiac Doppler Indications:    T24.58 Acute diastolic (congestive) heart failure  History:        Patient has prior history of Echocardiogram examinations, most                 recent 08/26/2016. Risk Factors:Hypertension and Dyslipidemia.                 Prior performed at [facility]                 History of CABG and MV  Repair (with ring?) in 2008.  Sonographer:    Damar Referring Phys: Bennett  Sonographer Comments: Technically difficult due to small rib spacing. IMPRESSIONS  1. Left ventricular ejection fraction, by estimation, is 35 to 40%. The left ventricle has moderately decreased function. The left ventricle demonstrates regional wall motion abnormalities (see scoring diagram/findings for description). Left ventricular  diastolic parameters are indeterminate.  2. Right ventricular systolic function is normal. The right ventricular size is normal. There is severely elevated pulmonary artery systolic pressure. The estimated right ventricular systolic pressure is 98.3 mmHg.  3. Left atrial size was mildly dilated.  4. ERO 22 mm2; R-Vol 35 mL. The mitral valve is myxomatous. Mild to moderate mitral valve regurgitation. The mean mitral valve gradient is 7.5 mmHg with average heart rate of 90 bpm. Moderate mitral annular calcification.  5. The aortic valve was not well visualized. Aortic valve regurgitation is moderate to severe. Mild aortic valve stenosis. Aortic regurgitation PHT measures 211 msec. Aortic valve mean gradient measures 11.0 mmHg. Comparison(s): No prior Echocardiogram. Conclusion(s)/Recommendation(s): Reduced LVEF, with WMAs; Mild-moderate MR with Elevated MV gradients and PASP 60+ mmHg with Mitral Annular Calcification. Moderate to severe AI without LV dilation, with mild AS. Multiple abnormalities noted.  FINDINGS  Left Ventricle: Left ventricular ejection fraction, by estimation, is 35 to 40%. The left ventricle has moderately decreased function. The left ventricle demonstrates regional wall motion abnormalities. The left ventricular internal cavity size was normal in size. There is no left ventricular hypertrophy. Left ventricular diastolic parameters are indeterminate.  LV Wall Scoring: The antero-lateral wall and posterior wall are hypokinetic. Globally down with lateral mid, base most prominent. Right Ventricle: The right ventricular size is normal. No increase in right ventricular wall thickness. Right ventricular systolic function is normal. There is severely elevated pulmonary artery systolic pressure. The tricuspid regurgitant velocity is 3.86 m/s, and with an assumed right atrial pressure of 8 mmHg, the estimated right ventricular systolic pressure is 38.2 mmHg. Left Atrium: Left atrial size was mildly dilated. Right Atrium: Right atrial size was normal in size. Pericardium: There is no evidence of pericardial effusion. Mitral Valve: ERO 22 mm2; R-Vol 35 mL. The mitral valve is myxomatous. Moderate mitral annular calcification. Mild to moderate mitral valve regurgitation. MV peak gradient, 17.9 mmHg. The mean mitral valve gradient is 7.5 mmHg with average heart rate of 90 bpm. Tricuspid Valve: The tricuspid valve is grossly normal. Tricuspid valve regurgitation is trivial. Aortic Valve: The aortic valve was not well visualized. Aortic valve regurgitation is moderate to severe. Aortic regurgitation PHT measures 211 msec. Mild aortic stenosis is present. Aortic valve mean gradient measures 11.0 mmHg. Aortic valve peak gradient measures 19.0 mmHg. Aortic valve area, by VTI measures 0.82 cm. Pulmonic Valve: The pulmonic valve was grossly normal. Pulmonic valve regurgitation is not visualized. Aorta: The aortic root is normal in size and structure and the ascending aorta was not well visualized. Venous: The  pulmonary veins were not well visualized. IAS/Shunts: The atrial septum is grossly normal.  LEFT VENTRICLE PLAX 2D LVIDd:         5.50 cm      Diastology LVIDs:         5.00 cm      LV e' medial:    3.81 cm/s LV PW:         0.70 cm      LV E/e' medial:  48.0 LV IVS:        0.70 cm  LV e' lateral:   3.59 cm/s LVOT diam:     1.80 cm      LV E/e' lateral: 51.0 LV SV:         41 LV SV Index:   26 LVOT Area:     2.54 cm  LV Volumes (MOD) LV vol d, MOD A2C: 129.0 ml LV vol d, MOD A4C: 142.0 ml LV vol s, MOD A2C: 85.1 ml LV vol s, MOD A4C: 80.5 ml LV SV MOD A2C:     43.9 ml LV SV MOD A4C:     142.0 ml LV SV MOD BP:      55.5 ml RIGHT VENTRICLE RV S prime:     8.05 cm/s TAPSE (M-mode): 1.2 cm LEFT ATRIUM             Index       RIGHT ATRIUM           Index LA diam:        3.50 cm 2.27 cm/m  RA Area:     17.30 cm LA Vol (A2C):   57.0 ml 37.00 ml/m RA Volume:   42.90 ml  27.85 ml/m LA Vol (A4C):   61.2 ml 39.72 ml/m LA Biplane Vol: 59.8 ml 38.81 ml/m  AORTIC VALVE AV Area (Vmax):    0.93 cm AV Area (Vmean):   0.88 cm AV Area (VTI):     0.82 cm AV Vmax:           218.00 cm/s AV Vmean:          160.000 cm/s AV VTI:            0.497 m AV Peak Grad:      19.0 mmHg AV Mean Grad:      11.0 mmHg LVOT Vmax:         79.40 cm/s LVOT Vmean:        55.200 cm/s LVOT VTI:          0.160 m LVOT/AV VTI ratio: 0.32 AI PHT:            211 msec  AORTA Ao Root diam: 2.50 cm MITRAL VALVE                 TRICUSPID VALVE MV Area (PHT): 3.53 cm      TR Peak grad:   59.6 mmHg MV Peak grad:  17.9 mmHg     TR Vmax:        386.00 cm/s MV Mean grad:  7.5 mmHg MV Vmax:       2.12 m/s      SHUNTS MV Vmean:      126.2 cm/s    Systemic VTI:  0.16 m MV Decel Time: 215 msec      Systemic Diam: 1.80 cm MR Peak grad:    108.6 mmHg MR Mean grad:    76.0 mmHg MR Vmax:         521.00 cm/s MR Vmean:        415.0 cm/s MR PISA:         1.01 cm MR PISA Eff ROA: 7 mm MR PISA Radius:  0.40 cm MV E velocity: 183.00 cm/s MV A velocity: 108.00 cm/s MV E/A  ratio:  1.69 Rudean Haskell MD Electronically signed by Rudean Haskell MD Signature Date/Time: 11/21/2019/5:12:23 PM    Final    Disposition   Pt is being discharged home today in good condition.  Follow-up Plans & Appointments     Follow-up  Information    Tobb, Kardie, DO Follow up on 12/05/2019.   Specialty: Cardiology Why: at 1pm. This appt is in the [facility] office!!! Contact information: [address] [place] [address] [phone]        [facility] Follow up on 11/28/2019.   Specialty: Cardiology Why: please go in for follow up labs Contact information: [place] [address] [phone]             Discharge Instructions    ([place]) Call MD:  Anytime you have any of the following symptoms: 1) 3 pound weight gain in 24 hours or 5 pounds in 1 week 2) shortness of breath, with or without a dry hacking cough 3) swelling in the hands, feet or stomach 4) if you have to sleep on extra pillows at night in order to breathe.   Complete by: As directed    Diet - low sodium heart healthy   Complete by: As directed    Increase activity slowly   Complete by: As directed       Discharge Medications   Allergies as of 11/24/2019      Reactions   Iodinated Diagnostic Agents Rash   Ticagrelor Rash   Statins Other (See Comments)   myalgias      Medication List    STOP taking these medications   lisinopril 5 MG tablet Commonly known as: ZESTRIL     TAKE these medications   albuterol 108 (90 Base) MCG/ACT inhaler Commonly known as: VENTOLIN HFA Inhale 1 puff into the lungs every 6 (six) hours as needed for shortness of breath.   aspirin 81 MG EC tablet Take 1 tablet (81 mg total) by mouth daily. Swallow whole. Start taking on: November 25, 2019   clopidogrel 75 MG tablet Commonly known as: PLAVIX Take 75 mg by mouth daily.   furosemide 20 MG tablet Commonly known as:  LASIX Take 1 tablet (20 mg total) by mouth daily. What changed:   when to take this  reasons to take this   levothyroxine 50 MCG tablet Commonly known as: SYNTHROID Take 50 mcg by mouth daily.   Metoprolol Tartrate 75 MG Tabs Take 75 mg by mouth 2 (two) times daily. What changed:   medication strength  how much to take  when to take this   nitroGLYCERIN 0.4 MG SL tablet Commonly known as: NITROSTAT Place 0.4 mg under the tongue every 5 (five) minutes as needed for chest pain.   ondansetron 4 MG disintegrating tablet Commonly known as: ZOFRAN-ODT Take 4 mg by mouth every 6 (six) hours as needed for nausea or vomiting.   pantoprazole 40 MG tablet Commonly known as: PROTONIX Take 40 mg by mouth daily.   RED YEAST RICE PO Take 1 tablet by mouth at bedtime.   spironolactone 25 MG tablet Commonly known as: ALDACTONE Take 0.5 tablets (12.5 mg total) by mouth daily. Start taking on: November 25, 2019   tamsulosin 0.4 MG Caps capsule Commonly known as: FLOMAX Take 0.4 mg by mouth daily.          Outstanding Labs/Studies   BMET  Duration of Discharge Encounter   Greater than 30 minutes including physician time.  Signed, Reino Bellis, NP 11/24/2019, 12:36 PM

## 2019-11-24 NOTE — Care Management (Signed)
1259 11-24-19 Case Manager spoke with patient regarding plan of care needs. Patient is from home alone and has the support of daughter that lives next door. Physical Therapy and Occupational Therapy- no recommendations for home health needs. Patient will not need any services at this time. Patient will transition home today.   Bethena Roys

## 2019-11-24 NOTE — Progress Notes (Addendum)
The patient has been seen in conjunction with Reino Bellis, NP. All aspects of care have been considered and discussed. The patient has been personally interviewed, examined, and all clinical data has been reviewed.   She feels well today.  Creatinine and BUN are slightly higher.  BUN because of steroids.  Creatinine because of diuresis.  Okay to discharge today.  No furosemide today.  Starting tomorrow recommend combined spironolactone 12.5 mg daily and furosemide 20 mg/day.  Will need to have a basic metabolic panel early next week.   Progress Note  Patient Name: Olivia [name] Date of Encounter: 11/24/2019  [facility] Cardiologist: Berniece Salines, DO   Subjective   Feeling much better today. Off O2 this morning.   Inpatient Medications    Scheduled Meds: . aspirin EC  81 mg Oral Daily  . clopidogrel  75 mg Oral Daily  . docusate sodium  100 mg Oral Daily  . enoxaparin (LOVENOX) injection  30 mg Subcutaneous Q24H  . feeding supplement  237 mL Oral BID BM  . levothyroxine  50 mcg Oral Q0600  . metoprolol tartrate  75 mg Oral BID  . ondansetron  4 mg Oral Q8H  . pantoprazole  40 mg Oral Daily  . predniSONE  5 mg Oral Q breakfast  . sodium chloride flush  3 mL Intravenous Q12H  . sodium chloride flush  3 mL Intravenous Q12H  . spironolactone  25 mg Oral Daily   Continuous Infusions: . sodium chloride    . sodium chloride     PRN Meds: sodium chloride, sodium chloride, acetaminophen, bisacodyl, polyethylene glycol, sodium chloride flush, sodium chloride flush, traMADol   Vital Signs    Vitals:   11/23/19 1451 11/23/19 1936 11/24/19 0003 11/24/19 0403  BP: (!) 131/58 (!) 108/51 (!) 133/55 (!) 125/47  Pulse: 95 81 76 65  Resp: 18 16 20 14   Temp: 98 F (36.7 C) 98.3 F (36.8 C) 98 F (36.7 C) 98.1 F (36.7 C)  TempSrc: Oral Oral Oral Oral  SpO2: 98% 91% (!) 89% 96%  Weight:    56.5 kg  Height:        Intake/Output Summary (Last 24 hours) at 11/24/2019  0803 Last data filed at 11/24/2019 0457 Gross per 24 hour  Intake 1203 ml  Output 1050 ml  Net 153 ml   Last 3 Weights 11/24/2019 11/23/2019 11/22/2019  Weight (lbs) 124 lb 8 oz 126 lb 9.6 oz 132 lb 15 oz  Weight (kg) 56.473 kg 57.425 kg 60.3 kg      Telemetry    SR-->episode of NSVT - Personally Reviewed  ECG    No new tracing this morning.   Physical Exam  Pleasant older female, sitting up in bed GEN: No acute distress.   Neck: No JVD Cardiac: RRR, + systolic murmur, no rubs, or gallops.  Respiratory: Clear to auscultation bilaterally. GI: Soft, nontender, non-distended  MS: No edema; No deformity. Neuro:  Nonfocal  Psych: Normal affect   Labs    High Sensitivity Troponin:   Recent Labs  Lab 11/18/19 0620 11/18/19 0706  TROPONINIHS 2,740* 1,913*      Chemistry Recent Labs  Lab 11/22/19 0322 11/23/19 0231 11/24/19 0150  NA 136 136 136  K 4.5 4.8 4.4  CL 103 100 98  CO2 25 28 29   GLUCOSE 103* 101* 97  BUN 30* 37* 42*  CREATININE 1.11* 1.24* 1.31*  CALCIUM 9.5 9.4 9.7  GFRNONAA 44* 39* 39*  ANIONGAP 8 8  9     Hematology Recent Labs  Lab 11/17/19 1731 11/17/19 2029 11/20/19 1108  WBC  --  9.1 12.6*  RBC  --  3.65* 3.55*  HGB 12.2  12.2 11.4* 11.2*  HCT 36.0  36.0 35.5* 35.0*  MCV  --  97.3 98.6  MCH  --  31.2 31.5  MCHC  --  32.1 32.0  RDW  --  12.9 13.1  PLT  --  225 270    BNP Recent Labs  Lab 11/21/19 1248  BNP 1,081.4*     DDimer  Recent Labs  Lab 11/21/19 0809  DDIMER 3.62*     Radiology    CT ANGIO CHEST PE W OR WO CONTRAST  Result Date: 11/22/2019 CLINICAL DATA:  Shortness of breath with elevated D-dimer levels. Low intermediate probability for pulmonary embolism. EXAM: CT ANGIOGRAPHY CHEST WITH CONTRAST TECHNIQUE: Multidetector CT imaging of the chest was performed using the standard protocol during bolus administration of intravenous contrast. Multiplanar CT image reconstructions and MIPs were obtained to evaluate  the vascular anatomy. The patient was premedicated according to the 13hour steroid and Benadryl prep due to a history of iodinated contrast allergy. There were no immediate complications. CONTRAST:  49mL OMNIPAQUE IOHEXOL 350 MG/ML SOLN COMPARISON:  Chest CT 12/20/2006.  Radiographs 11/22/2019. FINDINGS: Cardiovascular: The pulmonary arteries are extremely well opacified with contrast to the level of the subsegmental branches. There is no evidence of acute pulmonary embolism. There is very limited opacification of the systemic arteries. There is extensive atherosclerosis of the aorta, great vessels and coronary arteries post median sternotomy and CABG. 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 Aortic valvular calcifications are present. The heart is mildly enlarged. No significant pericardial fluid. Mediastinum/Nodes: There are no enlarged mediastinal, hilar or axillary lymph nodes. The thyroid gland, trachea and esophagus demonstrate no significant findings. Lungs/Pleura: There are moderate-sized dependent pleural effusions bilaterally with associated dependent atelectasis in both lungs. Minimal patchy ground-glass opacities within the aerated portions of lungs may reflect mild edema. There is no suspicious pulmonary nodularity. Upper abdomen: Reflux of contrast into the IVC and hepatic veins. Right-sided hydronephrosis and a hyperdense lesion in the upper pole of the right kidney measuring 19 mm on image 135/5 are again noted, unchanged from abdominal CT 11/04/2019. This renal lesion is not further characterized. Musculoskeletal/Chest wall: There is no chest wall mass or suspicious osseous finding. Thoracic kyphoscoliosis and chronic deformities of the T3 and T4 vertebral bodies appear unchanged. Review of the MIP images confirms the above findings. IMPRESSION: 1. No evidence of acute pulmonary embolism. 2. Moderate-sized dependent pleural effusions bilaterally with associated dependent atelectasis in both lungs. The  pleural effusions have developed over the last 3 weeks. Minimal patchy ground-glass opacities within the aerated portions of lungs may reflect mild edema. 3. Cardiomegaly with reflux of contrast into the IVC and hepatic veins suggesting elevated right heart pressures. 4. Stable right-sided hydronephrosis and a hyperdense lesion in the upper pole of the right kidney, not further characterized by this study. See abdominal CT report and recommendations 11/04/2019. 5. Aortic Atherosclerosis (ICD10-I70.0). Electronically Signed   By: Richardean Sale M.D.   On: 11/22/2019 12:47    Cardiac Studies   Cath: 11/17/19   Prox Cx to Mid Cx lesion is 99% stenosed. SVG to OM is occluded.  Prox LAD lesion is 70% stenosed. Mid LAD lesion is 75% stenosed. Patent LIMA to LAD.  Mid RCA lesion is 100% stenosed. Occluded SVG to RCA.  Severe three vessel disease.  The left ventricular ejection fraction is 35-45% by visual estimate.  LV end diastolic pressure is normal.  There is mild aortic valve stenosis.  Ao sat 87%, PA sat 54%, PA pressure 54/20, mean PA 37 mm Hg, mean PCWP 23 mm Hg; CO 3.89 L/min; CI 2.4  Hemodynamic findings consistent with mild pulmonary hypertension.  Severe PAD as well with heavily calcified right common femoral artery.  Ischemic cardiomyopathy, pulmonary hypertension. Sudden onset of shortness of breath this morning while awaiting kidney stone procedure. ? takotsubo given the wall motion abnormality, however, she does have severe CAD.   No good targets for PCI. Continue medical therapy.   She is most bothered by shortness of breath and palpitations. HR consistently above 115.  LVEDP 14 but she is quite hypoxemic. Would check chest xray to eval for pneumonia. Will give a trial dose of furosemide to see if her breathing improves.  Discussed findings with the daughter.   Diagnostic Dominance: Right   Echo: 11/21/19  IMPRESSIONS    1. Left ventricular  ejection fraction, by estimation, is 35 to 40%. The  left ventricle has moderately decreased function. The left ventricle  demonstrates regional wall motion abnormalities (see scoring  diagram/findings for description). Left ventricular  diastolic parameters are indeterminate.  2. Right ventricular systolic function is normal. The right ventricular  size is normal. There is severely elevated pulmonary artery systolic  pressure. The estimated right ventricular systolic pressure is 97.9 mmHg.  3. Left atrial size was mildly dilated.  4. ERO 22 mm2; R-Vol 35 mL. The mitral valve is myxomatous. Mild to  moderate mitral valve regurgitation. The mean mitral valve gradient is 7.5  mmHg with average heart rate of 90 bpm. Moderate mitral annular  calcification.  5. The aortic valve was not well visualized. Aortic valve regurgitation  is moderate to severe. Mild aortic valve stenosis. Aortic regurgitation  PHT measures 211 msec. Aortic valve mean gradient measures 11.0 mmHg.   Patient Profile     86 y.o. 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  Assessment & Plan    1. CAD: cath noted above with multi-vessel disease with no targets. Recommendation for continued medical therapy. Lithotripsy has now been postponed with the resumption of plavix and ASA this admission.  -- asa/plavix. Statin intolerance, consider adding Zetia prior to discharge  2. ICM:EF was 40-45% with basal to mid inferior wall and moderate hypokinesis of the mid anterior lateral and apical lateral walland moderate AS back 10/2019. This admission EF noted at 35-40% with anterolateral/posterior hypokinesis. Suspect mixed  etiology with underlying CAD along with Takotsubo presentation. Now maintaining her sats on RA this morning.  -- BNP 1081. Net - 2.8L, weights down to 124lbs -- continue BB, spiro 25mg  daily. Was doing lasix 20mg  PRN prior to admission, suspect 20mg  daily may be sufficient at discharge.   -- considered ACE/ARB but will hold for with recent contrast with mild increased in  -- if DC today, plan for BMET the first of the week  3. Elevated Ddimer:3.6, CT angio 10/20 was negative for PE, noted bilateral moderate pleural effusions.   4. Rash: significant improvement with prednisone taper, completing today. Suspect this was 2/2 dye  5. GX:QJJH on echo this admission. Severe AI with  PASP 60+ mmHg  6. SPJ:SUNHR calcification on right common femoral artery during cardiac cath --Continue aspirin and Plavix. Statin intolerance  7. AKI: 2/2 to contrast and diuretic use. Cr 1.2>>1.31 this morning. Hold additional diuresis today.  -- plan for 20mg  daily starting tomorrow with first of the week   For questions or updates, please contact [place] Please consult [email or web address] for contact info under        Signed, Reino Bellis, NP  11/24/2019, 8:03 AM

## 2019-11-28 ENCOUNTER — Other Ambulatory Visit: Payer: Self-pay

## 2019-11-28 DIAGNOSIS — Z79899 Other long term (current) drug therapy: Secondary | ICD-10-CM | POA: Diagnosis not present

## 2019-11-28 LAB — BASIC METABOLIC PANEL
BUN/Creatinine Ratio: 25 (ref 12–28)
BUN: 36 mg/dL — ABNORMAL HIGH (ref 8–27)
CO2: 28 mmol/L (ref 20–29)
Calcium: 10.6 mg/dL — ABNORMAL HIGH (ref 8.7–10.3)
Chloride: 96 mmol/L (ref 96–106)
Creatinine, Ser: 1.42 mg/dL — ABNORMAL HIGH (ref 0.57–1.00)
GFR calc Af Amer: 38 mL/min/{1.73_m2} — ABNORMAL LOW (ref >59)
GFR calc non Af Amer: 33 mL/min/{1.73_m2} — ABNORMAL LOW (ref >59)
Glucose: 94 mg/dL (ref 65–99)
Potassium: 5.3 mmol/L — ABNORMAL HIGH (ref 3.5–5.2)
Sodium: 140 mmol/L (ref 134–144)

## 2019-11-29 ENCOUNTER — Telehealth: Payer: Self-pay

## 2019-11-29 NOTE — Telephone Encounter (Signed)
Spoke with patient regarding results and recommendation.  Patient verbalizes understanding and is agreeable to plan of care. Advised patient to call back with any issues or concerns.  

## 2019-11-29 NOTE — Telephone Encounter (Signed)
-----   Message from Berniece Salines, DO sent at 11/28/2019  9:23 PM EDT ----- Your potassium is also slightly elevated we will continue to monitor.  We will get blood work when you come for your follow-up visit.

## 2019-11-30 ENCOUNTER — Other Ambulatory Visit: Payer: Self-pay

## 2019-11-30 DIAGNOSIS — Z7902 Long term (current) use of antithrombotics/antiplatelets: Secondary | ICD-10-CM | POA: Diagnosis not present

## 2019-11-30 DIAGNOSIS — R0602 Shortness of breath: Secondary | ICD-10-CM | POA: Diagnosis not present

## 2019-11-30 DIAGNOSIS — Z87891 Personal history of nicotine dependence: Secondary | ICD-10-CM | POA: Diagnosis not present

## 2019-11-30 DIAGNOSIS — I5032 Chronic diastolic (congestive) heart failure: Secondary | ICD-10-CM | POA: Diagnosis not present

## 2019-11-30 DIAGNOSIS — R000 Tachycardia, unspecified: Secondary | ICD-10-CM | POA: Diagnosis not present

## 2019-11-30 DIAGNOSIS — R2689 Other abnormalities of gait and mobility: Secondary | ICD-10-CM | POA: Diagnosis not present

## 2019-11-30 DIAGNOSIS — R2681 Unsteadiness on feet: Secondary | ICD-10-CM | POA: Diagnosis not present

## 2019-11-30 DIAGNOSIS — E785 Hyperlipidemia, unspecified: Secondary | ICD-10-CM | POA: Diagnosis not present

## 2019-11-30 DIAGNOSIS — I5023 Acute on chronic systolic (congestive) heart failure: Secondary | ICD-10-CM | POA: Diagnosis not present

## 2019-11-30 DIAGNOSIS — Z7982 Long term (current) use of aspirin: Secondary | ICD-10-CM | POA: Diagnosis not present

## 2019-11-30 DIAGNOSIS — Z79891 Long term (current) use of opiate analgesic: Secondary | ICD-10-CM | POA: Diagnosis not present

## 2019-11-30 DIAGNOSIS — M1991 Primary osteoarthritis, unspecified site: Secondary | ICD-10-CM | POA: Diagnosis not present

## 2019-11-30 DIAGNOSIS — R9431 Abnormal electrocardiogram [ECG] [EKG]: Secondary | ICD-10-CM | POA: Diagnosis not present

## 2019-11-30 DIAGNOSIS — I2510 Atherosclerotic heart disease of native coronary artery without angina pectoris: Secondary | ICD-10-CM | POA: Diagnosis not present

## 2019-11-30 DIAGNOSIS — I5042 Chronic combined systolic (congestive) and diastolic (congestive) heart failure: Secondary | ICD-10-CM | POA: Diagnosis not present

## 2019-11-30 DIAGNOSIS — I110 Hypertensive heart disease with heart failure: Secondary | ICD-10-CM | POA: Diagnosis not present

## 2019-11-30 DIAGNOSIS — I739 Peripheral vascular disease, unspecified: Secondary | ICD-10-CM | POA: Diagnosis not present

## 2019-11-30 DIAGNOSIS — R5383 Other fatigue: Secondary | ICD-10-CM | POA: Diagnosis not present

## 2019-11-30 DIAGNOSIS — C50411 Malignant neoplasm of upper-outer quadrant of right female breast: Secondary | ICD-10-CM | POA: Diagnosis not present

## 2019-11-30 DIAGNOSIS — I350 Nonrheumatic aortic (valve) stenosis: Secondary | ICD-10-CM | POA: Diagnosis not present

## 2019-11-30 DIAGNOSIS — Z79811 Long term (current) use of aromatase inhibitors: Secondary | ICD-10-CM | POA: Diagnosis not present

## 2019-11-30 DIAGNOSIS — R778 Other specified abnormalities of plasma proteins: Secondary | ICD-10-CM | POA: Diagnosis not present

## 2019-11-30 DIAGNOSIS — Z951 Presence of aortocoronary bypass graft: Secondary | ICD-10-CM | POA: Diagnosis not present

## 2019-11-30 DIAGNOSIS — N179 Acute kidney failure, unspecified: Secondary | ICD-10-CM | POA: Diagnosis not present

## 2019-11-30 DIAGNOSIS — Z79899 Other long term (current) drug therapy: Secondary | ICD-10-CM | POA: Diagnosis not present

## 2019-12-01 DIAGNOSIS — Z955 Presence of coronary angioplasty implant and graft: Secondary | ICD-10-CM | POA: Diagnosis not present

## 2019-12-01 DIAGNOSIS — I2510 Atherosclerotic heart disease of native coronary artery without angina pectoris: Secondary | ICD-10-CM | POA: Diagnosis not present

## 2019-12-01 DIAGNOSIS — I5023 Acute on chronic systolic (congestive) heart failure: Secondary | ICD-10-CM | POA: Diagnosis not present

## 2019-12-01 DIAGNOSIS — R112 Nausea with vomiting, unspecified: Secondary | ICD-10-CM | POA: Diagnosis not present

## 2019-12-01 DIAGNOSIS — N200 Calculus of kidney: Secondary | ICD-10-CM | POA: Diagnosis not present

## 2019-12-05 ENCOUNTER — Ambulatory Visit: Payer: Medicare Other | Admitting: Cardiology

## 2019-12-06 ENCOUNTER — Emergency Department (HOSPITAL_COMMUNITY): Payer: Medicare Other

## 2019-12-06 ENCOUNTER — Emergency Department (HOSPITAL_COMMUNITY)
Admission: EM | Admit: 2019-12-06 | Discharge: 2019-12-06 | Disposition: A | Discharge status: Home / Self Care | Payer: Medicare Other | Attending: Emergency Medicine | Admitting: Emergency Medicine

## 2019-12-06 ENCOUNTER — Encounter (HOSPITAL_COMMUNITY): Payer: Self-pay | Admitting: Emergency Medicine

## 2019-12-06 DIAGNOSIS — N2889 Other specified disorders of kidney and ureter: Secondary | ICD-10-CM | POA: Diagnosis not present

## 2019-12-06 DIAGNOSIS — R110 Nausea: Secondary | ICD-10-CM | POA: Diagnosis not present

## 2019-12-06 DIAGNOSIS — R5383 Other fatigue: Secondary | ICD-10-CM | POA: Diagnosis not present

## 2019-12-06 DIAGNOSIS — Z7982 Long term (current) use of aspirin: Secondary | ICD-10-CM | POA: Insufficient documentation

## 2019-12-06 DIAGNOSIS — Z9010 Acquired absence of unspecified breast and nipple: Secondary | ICD-10-CM | POA: Diagnosis not present

## 2019-12-06 DIAGNOSIS — R1111 Vomiting without nausea: Secondary | ICD-10-CM | POA: Diagnosis not present

## 2019-12-06 DIAGNOSIS — G4700 Insomnia, unspecified: Secondary | ICD-10-CM | POA: Diagnosis not present

## 2019-12-06 DIAGNOSIS — R0602 Shortness of breath: Secondary | ICD-10-CM | POA: Insufficient documentation

## 2019-12-06 DIAGNOSIS — I25810 Atherosclerosis of coronary artery bypass graft(s) without angina pectoris: Secondary | ICD-10-CM | POA: Insufficient documentation

## 2019-12-06 DIAGNOSIS — R1032 Left lower quadrant pain: Secondary | ICD-10-CM | POA: Insufficient documentation

## 2019-12-06 DIAGNOSIS — I2510 Atherosclerotic heart disease of native coronary artery without angina pectoris: Secondary | ICD-10-CM | POA: Diagnosis not present

## 2019-12-06 DIAGNOSIS — Z743 Need for continuous supervision: Secondary | ICD-10-CM | POA: Diagnosis not present

## 2019-12-06 DIAGNOSIS — I5021 Acute systolic (congestive) heart failure: Secondary | ICD-10-CM | POA: Insufficient documentation

## 2019-12-06 DIAGNOSIS — R0902 Hypoxemia: Secondary | ICD-10-CM | POA: Diagnosis not present

## 2019-12-06 DIAGNOSIS — I499 Cardiac arrhythmia, unspecified: Secondary | ICD-10-CM | POA: Diagnosis not present

## 2019-12-06 DIAGNOSIS — R112 Nausea with vomiting, unspecified: Secondary | ICD-10-CM | POA: Diagnosis not present

## 2019-12-06 DIAGNOSIS — R1110 Vomiting, unspecified: Secondary | ICD-10-CM | POA: Diagnosis not present

## 2019-12-06 DIAGNOSIS — I10 Essential (primary) hypertension: Secondary | ICD-10-CM | POA: Diagnosis not present

## 2019-12-06 DIAGNOSIS — K4020 Bilateral inguinal hernia, without obstruction or gangrene, not specified as recurrent: Secondary | ICD-10-CM | POA: Diagnosis not present

## 2019-12-06 DIAGNOSIS — Z79899 Other long term (current) drug therapy: Secondary | ICD-10-CM | POA: Diagnosis not present

## 2019-12-06 DIAGNOSIS — R6889 Other general symptoms and signs: Secondary | ICD-10-CM | POA: Diagnosis not present

## 2019-12-06 DIAGNOSIS — J90 Pleural effusion, not elsewhere classified: Secondary | ICD-10-CM | POA: Diagnosis not present

## 2019-12-06 DIAGNOSIS — I110 Hypertensive heart disease with heart failure: Secondary | ICD-10-CM | POA: Insufficient documentation

## 2019-12-06 DIAGNOSIS — N132 Hydronephrosis with renal and ureteral calculous obstruction: Secondary | ICD-10-CM | POA: Diagnosis not present

## 2019-12-06 LAB — CBC WITH DIFFERENTIAL/PLATELET
Abs Immature Granulocytes: 0.06 10*3/uL (ref 0.00–0.07)
Basophils Absolute: 0.1 10*3/uL (ref 0.0–0.1)
Basophils Relative: 1 %
Eosinophils Absolute: 0.3 10*3/uL (ref 0.0–0.5)
Eosinophils Relative: 3 %
HCT: 33.9 % — ABNORMAL LOW (ref 36.0–46.0)
Hemoglobin: 10.4 g/dL — ABNORMAL LOW (ref 12.0–15.0)
Immature Granulocytes: 1 %
Lymphocytes Relative: 9 %
Lymphs Abs: 0.9 10*3/uL (ref 0.7–4.0)
MCH: 30.5 pg (ref 26.0–34.0)
MCHC: 30.7 g/dL (ref 30.0–36.0)
MCV: 99.4 fL (ref 80.0–100.0)
Monocytes Absolute: 1.2 10*3/uL — ABNORMAL HIGH (ref 0.1–1.0)
Monocytes Relative: 12 %
Neutro Abs: 7.2 10*3/uL (ref 1.7–7.7)
Neutrophils Relative %: 74 %
Platelets: 451 10*3/uL — ABNORMAL HIGH (ref 150–400)
RBC: 3.41 MIL/uL — ABNORMAL LOW (ref 3.87–5.11)
RDW: 13 % (ref 11.5–15.5)
WBC: 9.8 10*3/uL (ref 4.0–10.5)
nRBC: 0 % (ref 0.0–0.2)

## 2019-12-06 LAB — COMPREHENSIVE METABOLIC PANEL
ALT: 16 U/L (ref 0–44)
AST: 19 U/L (ref 15–41)
Albumin: 2.9 g/dL — ABNORMAL LOW (ref 3.5–5.0)
Alkaline Phosphatase: 45 U/L (ref 38–126)
Anion gap: 10 (ref 5–15)
BUN: 21 mg/dL (ref 8–23)
CO2: 25 mmol/L (ref 22–32)
Calcium: 9.4 mg/dL (ref 8.9–10.3)
Chloride: 103 mmol/L (ref 98–111)
Creatinine, Ser: 1.03 mg/dL — ABNORMAL HIGH (ref 0.44–1.00)
GFR, Estimated: 52 mL/min — ABNORMAL LOW (ref >60)
Glucose, Bld: 118 mg/dL — ABNORMAL HIGH (ref 70–99)
Potassium: 4.6 mmol/L (ref 3.5–5.1)
Sodium: 138 mmol/L (ref 135–145)
Total Bilirubin: 0.6 mg/dL (ref 0.3–1.2)
Total Protein: 6.3 g/dL — ABNORMAL LOW (ref 6.5–8.1)

## 2019-12-06 LAB — MAGNESIUM: Magnesium: 2.1 mg/dL (ref 1.7–2.4)

## 2019-12-06 LAB — TROPONIN I (HIGH SENSITIVITY): Troponin I (High Sensitivity): 16 ng/L (ref <18)

## 2019-12-06 LAB — TSH: TSH: 5.34 u[IU]/mL — ABNORMAL HIGH (ref 0.350–4.500)

## 2019-12-06 LAB — BRAIN NATRIURETIC PEPTIDE: B Natriuretic Peptide: 884.4 pg/mL — ABNORMAL HIGH (ref 0.0–100.0)

## 2019-12-06 LAB — LIPASE, BLOOD: Lipase: 37 U/L (ref 11–51)

## 2019-12-06 MED ORDER — ONDANSETRON 4 MG PO TBDP: 4.0000 mg | ORAL_TABLET | Freq: Three times a day (TID) | ORAL | 0 refills | Status: DC | PRN

## 2019-12-06 MED ORDER — FENTANYL CITRATE (PF) 100 MCG/2ML IJ SOLN
25.0000 ug | Freq: Once | INTRAMUSCULAR | Status: AC
  Administered 2019-12-06: 25 ug via INTRAVENOUS
  Filled 2019-12-06: qty 2

## 2019-12-06 MED ORDER — ONDANSETRON HCL 4 MG/2ML IJ SOLN
4.0000 mg | Freq: Once | INTRAMUSCULAR | Status: AC
  Administered 2019-12-06: 4 mg via INTRAVENOUS
  Filled 2019-12-06: qty 2

## 2019-12-06 MED ORDER — PROMETHAZINE HCL 25 MG PO TABS: 25.0000 mg | ORAL_TABLET | Freq: Four times a day (QID) | ORAL | 0 refills | Status: DC | PRN

## 2019-12-06 NOTE — ED Triage Notes (Signed)
86 year old female patient with history of severe CAD hx of CABG and PCI in 2008 she was recently discharged on 10/22 where she and had repeat cath due to shortness of breath and elevated troponin's. Noted to have multiple vessel disease. Pt was discharged back home with her daughter and states since going home has not been eating and feeling nauseous. Pt also has been short of breath but denies any chest pain. Pt is alert and oriented.

## 2019-12-06 NOTE — Discharge Instructions (Signed)
Return for worsening abdominal pain, fever, inability to eat or drink.  

## 2019-12-06 NOTE — ED Provider Notes (Signed)
[facility] EMERGENCY DEPARTMENT Provider Note   CSN: [id number] Arrival date & time: 12/06/19  0348     History Chief Complaint  Patient presents with  . Nausea    Olivia [name] is a 86 y.o. female.  86 yo F with a chief complaints of nausea and vomiting.  This has been ongoing since she was in the hospital recently.  She spent 8 days in the hospital and had a cardiac catheterization that showed diffuse disease but nothing that was able to have an intervention performed on it.  She has had persistent shortness of breath on exertion and fatigue.  She is also been having insomnia.  Unable to sleep for the past couple days.  Having some pain on the left side.  Denies trauma to the abdomen.  Denies chest pain.  Denies cough congestion or fever.  The history is provided by the patient and a relative.  Illness Severity:  Moderate Onset quality:  Gradual Duration:  1 week Timing:  Intermittent Progression:  Worsening Chronicity:  New Associated symptoms: abdominal pain (left side pain), fatigue, nausea, shortness of breath and vomiting   Associated symptoms: no chest pain, no congestion, no cough, no fever, no headaches, no myalgias, no rhinorrhea and no wheezing        Past Medical History:  Diagnosis Date  . Age-related osteoporosis without current pathological fracture 10/29/2019  . Atherosclerotic heart disease of native coronary artery without angina pectoris 10/02/2014  . Breast cancer ([place])   . CAD (coronary artery disease) hx of cabg  . CHF (congestive heart failure), NYHA class III, acute, systolic ([place]) 1/74/0814  . Chronic systolic heart failure ([place])   . Drug-induced skin rash 03/28/2016  . Dysphagia as late effect of cerebrovascular accident (CVA) 04/02/2016  . Dyspnea   . Elevated troponin   . Embolic stroke involving right middle cerebral artery ([place]) 03/25/2016  . Essential (primary) hypertension 10/02/2014  . History of mitral valve repair 2008  . Hx  of CABG 2008  . Hyperlipidemia with target LDL less than 70   . Hypertension   . Ischemic dilated cardiomyopathy ([place]) 03/15/2017  . Long-term use of aspirin therapy 07/20/2017  . Malignant neoplasm of upper-outer quadrant of right female breast ([place])   . Mixed hyperlipidemia 05/28/2017  . Non-rheumatic mitral regurgitation 03/15/2017  . Nonrheumatic aortic valve insufficiency 03/15/2017  . Old MI (myocardial infarction) 05/28/2017  . Other specified postprocedural states 10/02/2014  . Palpitations 05/28/2017  . Protein-calorie malnutrition, severe 11/23/2019  . PVD (peripheral vascular disease) ([place])   . S/P CABG (coronary artery bypass graft) 03/15/2017  . Senile osteoporosis   . Statin intolerance 03/15/2017    Patient Active Problem List   Diagnosis Date Noted  . Dyspnea   . Protein-calorie malnutrition, severe 11/23/2019  . Elevated troponin   . Age-related osteoporosis without current pathological fracture 10/29/2019  . Long-term use of aspirin therapy 07/20/2017  . Mixed hyperlipidemia 05/28/2017  . Old MI (myocardial infarction) 05/28/2017  . Palpitations 05/28/2017  . CHF (congestive heart failure), NYHA class III, acute, systolic ([place]) 48/18/5631  . Ischemic dilated cardiomyopathy ([place]) 03/15/2017  . Nonrheumatic aortic valve insufficiency 03/15/2017  . Non-rheumatic mitral regurgitation 03/15/2017  . S/P CABG (coronary artery bypass graft) 03/15/2017  . Statin intolerance 03/15/2017  . Dysphagia as late effect of cerebrovascular accident (CVA) 04/02/2016  . Drug-induced skin rash 03/28/2016  . Embolic stroke involving right middle cerebral artery ([place]) 03/25/2016  . Atherosclerotic heart disease of native  coronary artery without angina pectoris 10/02/2014  . Essential (primary) hypertension 10/02/2014  . Other specified postprocedural states 10/02/2014    Past Surgical History:  Procedure Laterality Date  . BREAST LUMPECTOMY    . MITRAL VALVE REPAIR (MV)/CORONARY ARTERY  BYPASS GRAFTING (CABG)  2018  . RIGHT/LEFT HEART CATH AND CORONARY/GRAFT ANGIOGRAPHY N/A 11/17/2019   Procedure: RIGHT/LEFT HEART CATH AND CORONARY/GRAFT ANGIOGRAPHY;  Surgeon: Jettie Booze, MD;  Location: [facility];  Service: Cardiovascular;  Laterality: N/A;     OB History   No obstetric history on file.     Family History  Problem Relation Age of Onset  . Hypertension Father     Social History   Tobacco Use  . Smoking status: Never Smoker  . Smokeless tobacco: Never Used  Substance Use Topics  . Alcohol use: Not Currently  . Drug use: Never    Home Medications Prior to Admission medications   Medication Sig Start Date End Date Taking? Authorizing Provider  albuterol (VENTOLIN HFA) 108 (90 Base) MCG/ACT inhaler Inhale 1 puff into the lungs every 6 (six) hours as needed for shortness of breath. 04/14/16 04/24/20  [provider]  aspirin 81 MG chewable tablet  10/20/19   [provider]  aspirin EC 81 MG EC tablet Take 1 tablet (81 mg total) by mouth daily. Swallow whole. 11/25/19   Cheryln Manly, NP  clopidogrel (PLAVIX) 75 MG tablet Take 75 mg by mouth daily. 06/25/19   [provider]  furosemide (LASIX) 20 MG tablet Take 1 tablet (20 mg total) by mouth daily. 11/24/19   Cheryln Manly, NP  levothyroxine (SYNTHROID) 50 MCG tablet Take 50 mcg by mouth daily. 09/17/19   [provider]  Metoprolol Tartrate 75 MG TABS Take 75 mg by mouth 2 (two) times daily. 11/24/19   Cheryln Manly, NP  nitroGLYCERIN (NITROSTAT) 0.4 MG SL tablet Place 0.4 mg under the tongue every 5 (five) minutes as needed for chest pain. 10/26/19 10/25/20  [provider]  ondansetron (ZOFRAN ODT) 4 MG disintegrating tablet Take 1 tablet (4 mg total) by mouth every 8 (eight) hours as needed for nausea or vomiting. 12/06/19   Deno Etienne, DO  ondansetron (ZOFRAN) 4 MG tablet Take 4 mg by mouth every 8 (eight) hours as needed. 11/28/19    [provider]  pantoprazole (PROTONIX) 40 MG tablet Take 40 mg by mouth daily. 09/17/19   [provider]  promethazine (PHENERGAN) 25 MG tablet Take 1 tablet (25 mg total) by mouth every 6 (six) hours as needed for nausea or vomiting. 12/06/19   Deno Etienne, DO  Red Yeast Rice Extract (RED YEAST RICE PO) Take 1 tablet by mouth at bedtime.    [provider]  spironolactone (ALDACTONE) 25 MG tablet Take 0.5 tablets (12.5 mg total) by mouth daily. 11/25/19   Cheryln Manly, NP  tamsulosin (FLOMAX) 0.4 MG CAPS capsule Take 0.4 mg by mouth daily. 11/04/19   [provider]  traMADol (ULTRAM) 50 MG tablet Take 50 mg by mouth every 6 (six) hours as needed. 11/28/19   [provider]  vitamin E (VITAMIN E) 180 MG (400 UNITS) capsule Take 400 Units by mouth daily.    [provider]    Allergies    Iodinated diagnostic agents, Ticagrelor, and Statins  Review of Systems   Review of Systems  Constitutional: Positive for fatigue. Negative for chills and fever.  HENT: Negative for congestion and rhinorrhea.  Eyes: Negative for redness and visual disturbance.  Respiratory: Positive for shortness of breath. Negative for cough and wheezing.   Cardiovascular: Negative for chest pain and palpitations.  Gastrointestinal: Positive for abdominal pain (left side pain), nausea and vomiting.  Genitourinary: Negative for dysuria and urgency.  Musculoskeletal: Negative for arthralgias and myalgias.  Skin: Negative for pallor and wound.  Neurological: Negative for dizziness and headaches.    Physical Exam Updated Vital Signs BP 106/67   Pulse 98   Temp 97.8 F (36.6 C) (Temporal)   Resp (!) 30   SpO2 98%   Physical Exam Vitals and nursing note reviewed.  Constitutional:      General: She is not in acute distress.    Appearance: She is well-developed. She is not diaphoretic.  HENT:     Head: Normocephalic and atraumatic.  Eyes:     Pupils:  Pupils are equal, round, and reactive to light.  Cardiovascular:     Rate and Rhythm: Normal rate and regular rhythm.     Heart sounds: No murmur heard.  No friction rub. No gallop.   Pulmonary:     Effort: Pulmonary effort is normal.     Breath sounds: No wheezing or rales.  Abdominal:     General: There is no distension.     Palpations: Abdomen is soft.     Tenderness: There is abdominal tenderness.     Comments: Diffuse tenderness worse to the upper portion of the abdomen.  Musculoskeletal:        General: No tenderness.     Cervical back: Normal range of motion and neck supple.  Skin:    General: Skin is warm and dry.  Neurological:     Mental Status: She is alert and oriented to person, place, and time.  Psychiatric:        Behavior: Behavior normal.     ED Results / Procedures / Treatments   Labs (all labs ordered are listed, but only abnormal results are displayed) Labs Reviewed  CBC WITH DIFFERENTIAL/PLATELET - Abnormal; Notable for the following components:      Result Value   RBC 3.41 (*)    Hemoglobin 10.4 (*)    HCT 33.9 (*)    Platelets 451 (*)    Monocytes Absolute 1.2 (*)    All other components within normal limits  COMPREHENSIVE METABOLIC PANEL - Abnormal; Notable for the following components:   Glucose, Bld 118 (*)    Creatinine, Ser 1.03 (*)    Total Protein 6.3 (*)    Albumin 2.9 (*)    GFR, Estimated 52 (*)    All other components within normal limits  BRAIN NATRIURETIC PEPTIDE - Abnormal; Notable for the following components:   B Natriuretic Peptide 884.4 (*)    All other components within normal limits  TSH - Abnormal; Notable for the following components:   TSH 5.340 (*)    All other components within normal limits  LIPASE, BLOOD  MAGNESIUM  T4  TROPONIN I (HIGH SENSITIVITY)    EKG EKG Interpretation  Date/Time:  Wednesday December 06 2019 03:50:10 EDT Ventricular Rate:  98 PR Interval:    QRS Duration: 93 QT Interval:  361 QTC  Calculation: 461 R Axis:   63 Text Interpretation: Sinus rhythm Right atrial enlargement Posterior infarct, recent Lateral leads are also involved No significant change since last tracing Confirmed by Deno Etienne [phone]) on 12/06/2019 3:53:30 AM   Radiology CT ABDOMEN PELVIS WO CONTRAST  Result Date: 12/06/2019 CLINICAL  DATA:  Nausea and vomiting.  History of breast cancer EXAM: CT ABDOMEN AND PELVIS WITHOUT CONTRAST TECHNIQUE: Multidetector CT imaging of the abdomen and pelvis was performed following the standard protocol without IV contrast. COMPARISON:  11/04/2019 FINDINGS: Lower chest: Opacity small opacity at the lateral right costophrenic sulcus, minimally covered and indeterminate. Coronary atherosclerosis. Hepatobiliary: No focal liver abnormality.No evidence of biliary obstruction or stone. Pancreas: Unremarkable. Spleen: Unremarkable. Adrenals/Urinary Tract: Negative adrenals. Right hydronephrosis related to an 8 mm stone at the UPJ, unchanged from before. Right renal cortical thinning which is similar to before. High-density mass at the upper pole right kidney measuring 2.5 cm. A large left-sided calculus (17 mm) has migrated from the pelvis to the lower pole collecting system. No left hydronephrosis. Unremarkable bladder. Stomach/Bowel: No obstruction. No visible bowel inflammation. Extensive left colonic diverticula. Vascular/Lymphatic: No acute vascular abnormality. Diffuse atheromatous calcification. Both superficial femoral arteries appear collapsed. No mass or adenopathy. Reproductive:Hysterectomy Other: No ascites or pneumoperitoneum. Small bilateral inguinal hernia. Musculoskeletal: No acute abnormalities. Severe lumbar spine degeneration with exaggerated lumbar lordosis, L3-4 anterolisthesis, and rightward translation at L3-4. There is high-grade spinal and foraminal narrowings. IMPRESSION: 1. 8 mm right UPJ calculus with chronic hydronephrosis and cortical thinning that was also seen on  scan 1 month ago. 2. Small pulmonary opacity at the lateral right costophrenic sulcus, please correlate for respiratory symptoms. 3. 2.5 cm right renal mass which could be solid or complex cystic, reference preceding report. 4. 17 mm left renal calculus which has migrated from the renal pelvis to the lower pole collecting system since prior. 5. Numerous chronic findings are stable from prior and described above. Electronically Signed   By: Monte Fantasia M.D.   On: 12/06/2019 05:17   DG Chest Port 1 View  Result Date: 12/06/2019 CLINICAL DATA:  Shortness of breath. EXAM: PORTABLE CHEST 1 VIEW COMPARISON:  Previous chest radiograph from 11/22/2019 FINDINGS: Previous median sternotomy and CABG procedure. Cardiac enlargement. Aortic atherosclerotic calcifications. Small right pleural effusion. Interstitial edema pattern has improved when compared with the previous exam. Remote bilateral clavicle deformities. Likely posttraumatic. Similarly there are bilateral healed rib deformities IMPRESSION: Persistent small right pleural effusion. No interstitial edema or airspace opacities identified. Electronically Signed   By: Kerby Moors M.D.   On: 12/06/2019 05:13    Procedures Procedures (including critical care time)  Medications Ordered in ED Medications  ondansetron (ZOFRAN) injection 4 mg (4 mg Intravenous Given 12/06/19 0425)  fentaNYL (SUBLIMAZE) injection 25 mcg (25 mcg Intravenous Given 12/06/19 0424)    ED Course  I have reviewed the triage vital signs and the nursing notes.  Pertinent labs & imaging results that were available during my care of the patient were reviewed by me and considered in my medical decision making (see chart for details).    MDM Rules/Calculators/A&P                          86 yo F recently hospitalized here with a chief complaint of nausea and vomiting.  Abdominal pain on my exam.  Will obtain a CT scan.  Lab work.  Treat pain and nausea.  Reassess.  Patient  feeling better on reassessment.  Tolerating by mouth.  CT scan with no acute finding.  The patient does have the known right-sided UPJ stone.  Unchanged from imaging about a month ago.  Troponin has normalized.  TSH is slightly hypothyroid.  I discussed results with the family.  Will increase their  dose of Phenergan at home.  Trial of Zofran as well.  PCP follow-up.  6:32 AM:  I have discussed the diagnosis/risks/treatment options with the patient and believe the pt to be eligible for discharge home to follow-up with PCP. We also discussed returning to the ED immediately if new or worsening sx occur. We discussed the sx which are most concerning (e.g., sudden worsening pain, fever, inability to tolerate by mouth) that necessitate immediate return. Medications administered to the patient during their visit and any new prescriptions provided to the patient are listed below.  Medications given during this visit Medications  ondansetron (ZOFRAN) injection 4 mg (4 mg Intravenous Given 12/06/19 0425)  fentaNYL (SUBLIMAZE) injection 25 mcg (25 mcg Intravenous Given 12/06/19 0424)     The patient appears reasonably screen and/or stabilized for discharge and I doubt any other medical condition or other [facility] requiring further screening, evaluation, or treatment in the ED at this time prior to discharge.   Final Clinical Impression(s) / ED Diagnoses Final diagnoses:  Nausea    Rx / DC Orders ED Discharge Orders         Ordered    ondansetron (ZOFRAN ODT) 4 MG disintegrating tablet  Every 8 hours PRN        12/06/19 0617    promethazine (PHENERGAN) 25 MG tablet  Every 6 hours PRN        12/06/19 0617           Deno Etienne, DO 12/06/19 [phone]

## 2019-12-07 DIAGNOSIS — I255 Ischemic cardiomyopathy: Secondary | ICD-10-CM | POA: Diagnosis not present

## 2019-12-07 DIAGNOSIS — I2510 Atherosclerotic heart disease of native coronary artery without angina pectoris: Secondary | ICD-10-CM | POA: Diagnosis not present

## 2019-12-07 DIAGNOSIS — J9621 Acute and chronic respiratory failure with hypoxia: Secondary | ICD-10-CM | POA: Diagnosis not present

## 2019-12-07 DIAGNOSIS — Z7982 Long term (current) use of aspirin: Secondary | ICD-10-CM | POA: Diagnosis not present

## 2019-12-07 DIAGNOSIS — I272 Pulmonary hypertension, unspecified: Secondary | ICD-10-CM | POA: Diagnosis not present

## 2019-12-07 DIAGNOSIS — Z7902 Long term (current) use of antithrombotics/antiplatelets: Secondary | ICD-10-CM | POA: Diagnosis not present

## 2019-12-07 DIAGNOSIS — I5032 Chronic diastolic (congestive) heart failure: Secondary | ICD-10-CM | POA: Diagnosis not present

## 2019-12-07 DIAGNOSIS — Z951 Presence of aortocoronary bypass graft: Secondary | ICD-10-CM | POA: Diagnosis not present

## 2019-12-07 DIAGNOSIS — M1991 Primary osteoarthritis, unspecified site: Secondary | ICD-10-CM | POA: Diagnosis not present

## 2019-12-07 DIAGNOSIS — E43 Unspecified severe protein-calorie malnutrition: Secondary | ICD-10-CM | POA: Diagnosis not present

## 2019-12-07 DIAGNOSIS — I739 Peripheral vascular disease, unspecified: Secondary | ICD-10-CM | POA: Diagnosis not present

## 2019-12-07 DIAGNOSIS — Z79899 Other long term (current) drug therapy: Secondary | ICD-10-CM | POA: Diagnosis not present

## 2019-12-07 DIAGNOSIS — C50411 Malignant neoplasm of upper-outer quadrant of right female breast: Secondary | ICD-10-CM | POA: Diagnosis not present

## 2019-12-07 DIAGNOSIS — R5383 Other fatigue: Secondary | ICD-10-CM | POA: Diagnosis not present

## 2019-12-07 DIAGNOSIS — I110 Hypertensive heart disease with heart failure: Secondary | ICD-10-CM | POA: Diagnosis not present

## 2019-12-07 DIAGNOSIS — I5023 Acute on chronic systolic (congestive) heart failure: Secondary | ICD-10-CM | POA: Diagnosis not present

## 2019-12-07 DIAGNOSIS — Z952 Presence of prosthetic heart valve: Secondary | ICD-10-CM | POA: Diagnosis not present

## 2019-12-07 DIAGNOSIS — I350 Nonrheumatic aortic (valve) stenosis: Secondary | ICD-10-CM | POA: Diagnosis not present

## 2019-12-07 DIAGNOSIS — E785 Hyperlipidemia, unspecified: Secondary | ICD-10-CM | POA: Diagnosis not present

## 2019-12-07 DIAGNOSIS — Z87891 Personal history of nicotine dependence: Secondary | ICD-10-CM | POA: Diagnosis not present

## 2019-12-07 LAB — T4: T4, Total: 6.6 ug/dL (ref 4.5–12.0)

## 2019-12-08 ENCOUNTER — Ambulatory Visit: Payer: Medicare Other | Admitting: Cardiology

## 2019-12-08 DIAGNOSIS — I509 Heart failure, unspecified: Secondary | ICD-10-CM | POA: Diagnosis not present

## 2019-12-14 DIAGNOSIS — Z79899 Other long term (current) drug therapy: Secondary | ICD-10-CM | POA: Diagnosis not present

## 2019-12-14 DIAGNOSIS — R5383 Other fatigue: Secondary | ICD-10-CM | POA: Diagnosis not present

## 2019-12-14 DIAGNOSIS — Z952 Presence of prosthetic heart valve: Secondary | ICD-10-CM | POA: Diagnosis not present

## 2019-12-14 DIAGNOSIS — I350 Nonrheumatic aortic (valve) stenosis: Secondary | ICD-10-CM | POA: Diagnosis not present

## 2019-12-14 DIAGNOSIS — C50411 Malignant neoplasm of upper-outer quadrant of right female breast: Secondary | ICD-10-CM | POA: Diagnosis not present

## 2019-12-14 DIAGNOSIS — I255 Ischemic cardiomyopathy: Secondary | ICD-10-CM | POA: Diagnosis not present

## 2019-12-14 DIAGNOSIS — Z7902 Long term (current) use of antithrombotics/antiplatelets: Secondary | ICD-10-CM | POA: Diagnosis not present

## 2019-12-14 DIAGNOSIS — Z7982 Long term (current) use of aspirin: Secondary | ICD-10-CM | POA: Diagnosis not present

## 2019-12-14 DIAGNOSIS — I110 Hypertensive heart disease with heart failure: Secondary | ICD-10-CM | POA: Diagnosis not present

## 2019-12-14 DIAGNOSIS — E43 Unspecified severe protein-calorie malnutrition: Secondary | ICD-10-CM | POA: Diagnosis not present

## 2019-12-14 DIAGNOSIS — Z87891 Personal history of nicotine dependence: Secondary | ICD-10-CM | POA: Diagnosis not present

## 2019-12-14 DIAGNOSIS — R5382 Chronic fatigue, unspecified: Secondary | ICD-10-CM | POA: Diagnosis not present

## 2019-12-14 DIAGNOSIS — E785 Hyperlipidemia, unspecified: Secondary | ICD-10-CM | POA: Diagnosis not present

## 2019-12-14 DIAGNOSIS — I5023 Acute on chronic systolic (congestive) heart failure: Secondary | ICD-10-CM | POA: Diagnosis not present

## 2019-12-14 DIAGNOSIS — M1991 Primary osteoarthritis, unspecified site: Secondary | ICD-10-CM | POA: Diagnosis not present

## 2019-12-14 DIAGNOSIS — I2510 Atherosclerotic heart disease of native coronary artery without angina pectoris: Secondary | ICD-10-CM | POA: Diagnosis not present

## 2019-12-14 DIAGNOSIS — I739 Peripheral vascular disease, unspecified: Secondary | ICD-10-CM | POA: Diagnosis not present

## 2019-12-14 DIAGNOSIS — I5032 Chronic diastolic (congestive) heart failure: Secondary | ICD-10-CM | POA: Diagnosis not present

## 2019-12-14 DIAGNOSIS — I272 Pulmonary hypertension, unspecified: Secondary | ICD-10-CM | POA: Diagnosis not present

## 2019-12-14 DIAGNOSIS — Z951 Presence of aortocoronary bypass graft: Secondary | ICD-10-CM | POA: Diagnosis not present

## 2019-12-18 ENCOUNTER — Other Ambulatory Visit: Payer: Self-pay | Admitting: Cardiology

## 2019-12-19 DIAGNOSIS — C50411 Malignant neoplasm of upper-outer quadrant of right female breast: Secondary | ICD-10-CM | POA: Diagnosis not present

## 2019-12-19 DIAGNOSIS — R5383 Other fatigue: Secondary | ICD-10-CM | POA: Diagnosis not present

## 2019-12-19 DIAGNOSIS — I350 Nonrheumatic aortic (valve) stenosis: Secondary | ICD-10-CM | POA: Diagnosis not present

## 2019-12-19 DIAGNOSIS — Z7902 Long term (current) use of antithrombotics/antiplatelets: Secondary | ICD-10-CM | POA: Diagnosis not present

## 2019-12-19 DIAGNOSIS — E43 Unspecified severe protein-calorie malnutrition: Secondary | ICD-10-CM | POA: Diagnosis not present

## 2019-12-19 DIAGNOSIS — I2510 Atherosclerotic heart disease of native coronary artery without angina pectoris: Secondary | ICD-10-CM | POA: Diagnosis not present

## 2019-12-19 DIAGNOSIS — I5023 Acute on chronic systolic (congestive) heart failure: Secondary | ICD-10-CM | POA: Diagnosis not present

## 2019-12-19 DIAGNOSIS — I739 Peripheral vascular disease, unspecified: Secondary | ICD-10-CM | POA: Diagnosis not present

## 2019-12-19 DIAGNOSIS — Z951 Presence of aortocoronary bypass graft: Secondary | ICD-10-CM | POA: Diagnosis not present

## 2019-12-19 DIAGNOSIS — Z7982 Long term (current) use of aspirin: Secondary | ICD-10-CM | POA: Diagnosis not present

## 2019-12-19 DIAGNOSIS — I110 Hypertensive heart disease with heart failure: Secondary | ICD-10-CM | POA: Diagnosis not present

## 2019-12-19 DIAGNOSIS — Z952 Presence of prosthetic heart valve: Secondary | ICD-10-CM | POA: Diagnosis not present

## 2019-12-19 DIAGNOSIS — E785 Hyperlipidemia, unspecified: Secondary | ICD-10-CM | POA: Diagnosis not present

## 2019-12-19 DIAGNOSIS — I272 Pulmonary hypertension, unspecified: Secondary | ICD-10-CM | POA: Diagnosis not present

## 2019-12-19 DIAGNOSIS — M1991 Primary osteoarthritis, unspecified site: Secondary | ICD-10-CM | POA: Diagnosis not present

## 2019-12-19 DIAGNOSIS — Z87891 Personal history of nicotine dependence: Secondary | ICD-10-CM | POA: Diagnosis not present

## 2019-12-19 DIAGNOSIS — Z79899 Other long term (current) drug therapy: Secondary | ICD-10-CM | POA: Diagnosis not present

## 2019-12-19 DIAGNOSIS — I255 Ischemic cardiomyopathy: Secondary | ICD-10-CM | POA: Diagnosis not present

## 2019-12-19 DIAGNOSIS — I5032 Chronic diastolic (congestive) heart failure: Secondary | ICD-10-CM | POA: Diagnosis not present

## 2019-12-27 DIAGNOSIS — Z7982 Long term (current) use of aspirin: Secondary | ICD-10-CM | POA: Diagnosis not present

## 2019-12-27 DIAGNOSIS — M1991 Primary osteoarthritis, unspecified site: Secondary | ICD-10-CM | POA: Diagnosis not present

## 2019-12-27 DIAGNOSIS — R5382 Chronic fatigue, unspecified: Secondary | ICD-10-CM | POA: Diagnosis not present

## 2019-12-27 DIAGNOSIS — C50411 Malignant neoplasm of upper-outer quadrant of right female breast: Secondary | ICD-10-CM | POA: Diagnosis not present

## 2019-12-27 DIAGNOSIS — I350 Nonrheumatic aortic (valve) stenosis: Secondary | ICD-10-CM | POA: Diagnosis not present

## 2019-12-27 DIAGNOSIS — Z951 Presence of aortocoronary bypass graft: Secondary | ICD-10-CM | POA: Diagnosis not present

## 2019-12-27 DIAGNOSIS — Z87891 Personal history of nicotine dependence: Secondary | ICD-10-CM | POA: Diagnosis not present

## 2019-12-27 DIAGNOSIS — I5032 Chronic diastolic (congestive) heart failure: Secondary | ICD-10-CM | POA: Diagnosis not present

## 2019-12-27 DIAGNOSIS — Z952 Presence of prosthetic heart valve: Secondary | ICD-10-CM | POA: Diagnosis not present

## 2019-12-27 DIAGNOSIS — I110 Hypertensive heart disease with heart failure: Secondary | ICD-10-CM | POA: Diagnosis not present

## 2019-12-27 DIAGNOSIS — Z7902 Long term (current) use of antithrombotics/antiplatelets: Secondary | ICD-10-CM | POA: Diagnosis not present

## 2019-12-27 DIAGNOSIS — I739 Peripheral vascular disease, unspecified: Secondary | ICD-10-CM | POA: Diagnosis not present

## 2019-12-27 DIAGNOSIS — E785 Hyperlipidemia, unspecified: Secondary | ICD-10-CM | POA: Diagnosis not present

## 2019-12-27 DIAGNOSIS — I5023 Acute on chronic systolic (congestive) heart failure: Secondary | ICD-10-CM | POA: Diagnosis not present

## 2019-12-27 DIAGNOSIS — I272 Pulmonary hypertension, unspecified: Secondary | ICD-10-CM | POA: Diagnosis not present

## 2019-12-27 DIAGNOSIS — I255 Ischemic cardiomyopathy: Secondary | ICD-10-CM | POA: Diagnosis not present

## 2019-12-27 DIAGNOSIS — I2510 Atherosclerotic heart disease of native coronary artery without angina pectoris: Secondary | ICD-10-CM | POA: Diagnosis not present

## 2019-12-27 DIAGNOSIS — E43 Unspecified severe protein-calorie malnutrition: Secondary | ICD-10-CM | POA: Diagnosis not present

## 2019-12-27 DIAGNOSIS — Z79899 Other long term (current) drug therapy: Secondary | ICD-10-CM | POA: Diagnosis not present

## 2019-12-29 DIAGNOSIS — I739 Peripheral vascular disease, unspecified: Secondary | ICD-10-CM | POA: Diagnosis not present

## 2019-12-29 DIAGNOSIS — L03031 Cellulitis of right toe: Secondary | ICD-10-CM | POA: Diagnosis not present

## 2020-01-01 ENCOUNTER — Other Ambulatory Visit: Payer: Self-pay

## 2020-01-01 DIAGNOSIS — I739 Peripheral vascular disease, unspecified: Secondary | ICD-10-CM | POA: Insufficient documentation

## 2020-01-01 DIAGNOSIS — I5022 Chronic systolic (congestive) heart failure: Secondary | ICD-10-CM | POA: Insufficient documentation

## 2020-01-01 DIAGNOSIS — M810 Age-related osteoporosis without current pathological fracture: Secondary | ICD-10-CM | POA: Insufficient documentation

## 2020-01-01 DIAGNOSIS — I10 Essential (primary) hypertension: Secondary | ICD-10-CM | POA: Insufficient documentation

## 2020-01-01 DIAGNOSIS — E785 Hyperlipidemia, unspecified: Secondary | ICD-10-CM | POA: Insufficient documentation

## 2020-01-01 DIAGNOSIS — C50919 Malignant neoplasm of unspecified site of unspecified female breast: Secondary | ICD-10-CM | POA: Insufficient documentation

## 2020-01-01 DIAGNOSIS — I2510 Atherosclerotic heart disease of native coronary artery without angina pectoris: Secondary | ICD-10-CM | POA: Insufficient documentation

## 2020-01-01 DIAGNOSIS — C50411 Malignant neoplasm of upper-outer quadrant of right female breast: Secondary | ICD-10-CM | POA: Insufficient documentation

## 2020-01-02 ENCOUNTER — Encounter: Payer: Self-pay | Admitting: Cardiology

## 2020-01-02 ENCOUNTER — Other Ambulatory Visit: Payer: Self-pay

## 2020-01-02 ENCOUNTER — Ambulatory Visit: Payer: Medicare Other | Admitting: Cardiology

## 2020-01-02 VITALS — BP 112/60 | HR 70 | Ht 64.0 in | Wt 125.0 lb

## 2020-01-02 DIAGNOSIS — I5021 Acute systolic (congestive) heart failure: Secondary | ICD-10-CM

## 2020-01-02 DIAGNOSIS — I255 Ischemic cardiomyopathy: Secondary | ICD-10-CM

## 2020-01-02 DIAGNOSIS — I420 Dilated cardiomyopathy: Secondary | ICD-10-CM

## 2020-01-02 DIAGNOSIS — I2510 Atherosclerotic heart disease of native coronary artery without angina pectoris: Secondary | ICD-10-CM

## 2020-01-02 DIAGNOSIS — I340 Nonrheumatic mitral (valve) insufficiency: Secondary | ICD-10-CM | POA: Diagnosis not present

## 2020-01-02 DIAGNOSIS — I5181 Takotsubo syndrome: Secondary | ICD-10-CM | POA: Diagnosis not present

## 2020-01-02 DIAGNOSIS — I10 Essential (primary) hypertension: Secondary | ICD-10-CM | POA: Diagnosis not present

## 2020-01-02 DIAGNOSIS — I351 Nonrheumatic aortic (valve) insufficiency: Secondary | ICD-10-CM

## 2020-01-02 DIAGNOSIS — I739 Peripheral vascular disease, unspecified: Secondary | ICD-10-CM

## 2020-01-02 NOTE — Progress Notes (Signed)
Cardiology Office Note:    Date:  01/02/2020   ID:  Olivia [name], DOB 13-Nov-1929, MRN [id number]  PCP:  Nicoletta Dress, MD  Cardiologist:  Berniece Salines, DO  Electrophysiologist:  None   Referring MD: Nicoletta Dress, MD   " I am doing little better"  History of Present Illness:    Olivia [name] is a 86 y.o. female with a hx of coronary disease status post CABG with PCI in 2008, mitral valve repair in 2008 ischemic cardiomyopathy, chronic systolic heart failure EF in October 30 5 to 40% pulmonary hypertension, mild aortic stenosis, mild to moderate mitral regurgitation is here today for follow-up visit.  The patient in October presented to the rate of hospital with shortness of breath palpitations and NSTEMI.  She was transferred to [facility] for the heart catheterization.  Her catheterization did not require any PCI however there is concern that she may have had talked with cardiomyopathy.  She was continue to be treated for acute exacerbation of heart failure.  She was subsequently discharged home and is here today for follow-up visit. Since that time she reported that she has been having some calf pain at home she has had also oxygen use which she has tapered off and is no longer using oxygen at home.  She is here today with her daughter  Past Medical History:  Diagnosis Date  . Age-related osteoporosis without current pathological fracture 10/29/2019  . Atherosclerotic heart disease of native coronary artery without angina pectoris 10/02/2014  . Breast cancer ([place])   . CAD (coronary artery disease) hx of cabg  . CHF (congestive heart failure), NYHA class III, acute, systolic ([place]) 0/25/4270  . Chronic systolic heart failure ([place])   . Drug-induced skin rash 03/28/2016  . Dysphagia as late effect of cerebrovascular accident (CVA) 04/02/2016  . Dyspnea   . Elevated troponin   . Embolic stroke involving right middle cerebral artery ([place]) 03/25/2016  . Essential  (primary) hypertension 10/02/2014  . History of mitral valve repair 2008  . Hx of CABG 2008  . Hyperlipidemia with target LDL less than 70   . Hypertension   . Ischemic dilated cardiomyopathy ([place]) 03/15/2017  . Long-term use of aspirin therapy 07/20/2017  . Malignant neoplasm of upper-outer quadrant of right female breast ([place])   . Mixed hyperlipidemia 05/28/2017  . Non-rheumatic mitral regurgitation 03/15/2017  . Nonrheumatic aortic valve insufficiency 03/15/2017  . Old MI (myocardial infarction) 05/28/2017  . Other specified postprocedural states 10/02/2014  . Palpitations 05/28/2017  . Protein-calorie malnutrition, severe 11/23/2019  . PVD (peripheral vascular disease) ([place])   . S/P CABG (coronary artery bypass graft) 03/15/2017  . Senile osteoporosis   . Statin intolerance 03/15/2017    Past Surgical History:  Procedure Laterality Date  . BREAST LUMPECTOMY    . MITRAL VALVE REPAIR (MV)/CORONARY ARTERY BYPASS GRAFTING (CABG)  2018  . RIGHT/LEFT HEART CATH AND CORONARY/GRAFT ANGIOGRAPHY N/A 11/17/2019   Procedure: RIGHT/LEFT HEART CATH AND CORONARY/GRAFT ANGIOGRAPHY;  Surgeon: Jettie Booze, MD;  Location: [place] [facility];  Service: Cardiovascular;  Laterality: N/A;    Current Medications: Current Meds  Medication Sig  . albuterol (VENTOLIN HFA) 108 (90 Base) MCG/ACT inhaler Inhale 1 puff into the lungs every 6 (six) hours as needed for shortness of breath.  Marland Kitchen aspirin EC 81 MG EC tablet Take 1 tablet (81 mg total) by mouth daily. Swallow whole.  . clopidogrel (PLAVIX) 75 MG tablet Take 75 mg by mouth  daily.  . furosemide (LASIX) 20 MG tablet Take 1 tablet (20 mg total) by mouth daily.  Marland Kitchen levothyroxine (SYNTHROID) 50 MCG tablet Take 50 mcg by mouth daily.  . Metoprolol Tartrate 75 MG TABS Take 75 mg by mouth 2 (two) times daily.  . nitroGLYCERIN (NITROSTAT) 0.4 MG SL tablet Place 0.4 mg under the tongue every 5 (five) minutes as needed for chest pain.  Marland Kitchen ondansetron (ZOFRAN  ODT) 4 MG disintegrating tablet Take 1 tablet (4 mg total) by mouth every 8 (eight) hours as needed for nausea or vomiting.  . ondansetron (ZOFRAN) 4 MG tablet Take 4 mg by mouth every 8 (eight) hours as needed.  . pantoprazole (PROTONIX) 40 MG tablet Take 40 mg by mouth daily.  . promethazine (PHENERGAN) 25 MG tablet Take 1 tablet (25 mg total) by mouth every 6 (six) hours as needed for nausea or vomiting.  . Red Yeast Rice Extract (RED YEAST RICE PO) Take 1 tablet by mouth at bedtime.  Marland Kitchen spironolactone (ALDACTONE) 25 MG tablet Take 0.5 tablets (12.5 mg total) by mouth daily.  . traMADol (ULTRAM) 50 MG tablet Take 50 mg by mouth every 6 (six) hours as needed.  . vitamin E (VITAMIN E) 180 MG (400 UNITS) capsule Take 400 Units by mouth daily.     Allergies:   Iodinated diagnostic agents, Ticagrelor, and Statins   Social History   Socioeconomic History  . Marital status: Widowed    Spouse name: Not on file  . Number of children: Not on file  . Years of education: Not on file  . Highest education level: Not on file  Occupational History  . Not on file  Tobacco Use  . Smoking status: Former Smoker    Quit date: 2008    Years since quitting: 13.9  . Smokeless tobacco: Never Used  Substance and Sexual Activity  . Alcohol use: Not Currently  . Drug use: Never  . Sexual activity: Not on file  Other Topics Concern  . Not on file  Social History Narrative  . Not on file   Social Determinants of Health   Financial Resource Strain:   . Difficulty of Paying Living Expenses: Not on file  Food Insecurity:   . Worried About Charity fundraiser in the Last Year: Not on file  . Ran Out of Food in the Last Year: Not on file  Transportation Needs:   . Lack of Transportation (Medical): Not on file  . Lack of Transportation (Non-Medical): Not on file  Physical Activity:   . Days of Exercise per Week: Not on file  . Minutes of Exercise per Session: Not on file  Stress:   . Feeling of  Stress : Not on file  Social Connections:   . Frequency of Communication with Friends and Family: Not on file  . Frequency of Social Gatherings with Friends and Family: Not on file  . Attends Religious Services: Not on file  . Active Member of Clubs or Organizations: Not on file  . Attends Archivist Meetings: Not on file  . Marital Status: Not on file     Family History: The patient's family history includes Hypertension in her father.  ROS:   Review of Systems  Constitution: Negative for decreased appetite, fever and weight gain.  HENT: Negative for congestion, ear discharge, hoarse voice and sore throat.   Eyes: Negative for discharge, redness, vision loss in right eye and visual halos.  Cardiovascular: Negative for chest pain, dyspnea  on exertion, leg swelling, orthopnea and palpitations.  Respiratory: Negative for cough, hemoptysis, shortness of breath and snoring.   Endocrine: Negative for heat intolerance and polyphagia.  Hematologic/Lymphatic: Negative for bleeding problem. Does not bruise/bleed easily.  Skin: Negative for flushing, nail changes, rash and suspicious lesions.  Musculoskeletal: Negative for arthritis, joint pain, muscle cramps, myalgias, neck pain and stiffness.  Gastrointestinal: Negative for abdominal pain, bowel incontinence, diarrhea and excessive appetite.  Genitourinary: Negative for decreased libido, genital sores and incomplete emptying.  Neurological: Negative for brief paralysis, focal weakness, headaches and loss of balance.  Psychiatric/Behavioral: Negative for altered mental status, depression and suicidal ideas.  Allergic/Immunologic: Negative for HIV exposure and persistent infections.    EKGs/Labs/Other Studies Reviewed:    The following studies were reviewed today:   EKG:  The ekg ordered today demonstrates   Cath: 11/17/19 Prox Cx to Mid Cx lesion is 99% stenosed. SVG to OM is occluded.  Prox LAD lesion is 70% stenosed. Mid  LAD lesion is 75% stenosed. Patent LIMA to LAD.  Mid RCA lesion is 100% stenosed. Occluded SVG to RCA.  Severe three vessel disease.  The left ventricular ejection fraction is 35-45% by visual estimate.  LV end diastolic pressure is normal.  There is mild aortic valve stenosis.  Ao sat 87%, PA sat 54%, PA pressure 54/20, mean PA 37 mm Hg, mean PCWP 23 mm Hg; CO 3.89 L/min; CI 2.4  Hemodynamic findings consistent with mild pulmonary hypertension.  Severe PAD as well with heavily calcified right common femoral artery  TTE IMPRESSIONS  1. Left ventricular ejection fraction, by estimation, is 35 to 40%. The left ventricle has moderately decreased function. The left ventricle demonstrates regional wall motion abnormalities (see scoring  diagram/findings for description). Left ventricular diastolic parameters are indeterminate.  2. Right ventricular systolic function is normal. The right ventricular size is normal. There is severely elevated pulmonary artery systolic pressure. The estimated right ventricular systolic pressure is 56.4 mmHg.  3. Left atrial size was mildly dilated.  4. ERO 22 mm2; R-Vol 35 mL. The mitral valve is myxomatous. Mild to moderate mitral valve regurgitation. The mean mitral valve gradient is 7.5 mmHg with average heart rate of 90 bpm. Moderate mitral annular  calcification.  5. The aortic valve was not well visualized. Aortic valve regurgitation is moderate to severe. Mild aortic valve stenosis. Aortic regurgitation PHT measures 211 msec. Aortic valve mean gradient measures 11.0 mmHg.   Recent Labs: 12/06/2019: ALT 16; B Natriuretic Peptide 884.4; BUN 21; Creatinine, Ser 1.03; Hemoglobin 10.4; Magnesium 2.1; Platelets 451; Potassium 4.6; Sodium 138; TSH 5.340  Recent Lipid Panel    Component Value Date/Time   CHOL 221 (H) 11/21/2019 0712   TRIG 165 (H) 11/21/2019 0712   HDL 45 11/21/2019 0712   CHOLHDL 4.9 11/21/2019 0712   VLDL 33 11/21/2019 0712    LDLCALC 143 (H) 11/21/2019 0712    Physical Exam:    VS:  BP 112/60   Pulse 70   Ht 5\' 4"  (1.626 m)   Wt 125 lb (56.7 kg)   SpO2 99%   BMI 21.46 kg/m     Wt Readings from Last 3 Encounters:  01/02/20 125 lb (56.7 kg)  11/27/19 124 lb (56.2 kg)  11/24/19 124 lb 8 oz (56.5 kg)     GEN: Well nourished, well developed in no acute distress HEENT: Normal NECK: No JVD; No carotid bruits LYMPHATICS: No lymphadenopathy CARDIAC: S1S2 noted,RRR, no murmurs, rubs, gallops RESPIRATORY:  Clear to auscultation  without rales, wheezing or rhonchi  ABDOMEN: Soft, non-tender, non-distended, +bowel sounds, no guarding. EXTREMITIES: No edema, No cyanosis, no clubbing MUSCULOSKELETAL:  No deformity  SKIN: Warm and dry NEUROLOGIC:  Alert and oriented x 3, non-focal PSYCHIATRIC:  Normal affect, good insight  ASSESSMENT:    1. Takotsubo cardiomyopathy   2. CHF (congestive heart failure), NYHA class III, acute, systolic ([place])   3. Essential (primary) hypertension   4. Ischemic dilated cardiomyopathy ([place])   5. Non-rheumatic mitral regurgitation   6. Nonrheumatic aortic valve insufficiency   7. Coronary artery disease involving native coronary artery of native heart without angina pectoris    PLAN:     I am going to continue her current medication regimen today which includes aspirin and Plavix for coronary artery disease no evidence of any anginal symptoms. For dilated cardiomyopathy she will remain on her metoprolol tartrate and spironolactone.  She is not on ACE/ARB or Entresto.  We will have her consider this medication.  Obtain a repeat heart echocardiogram given tachycardia Subu cardiomyopathy was of concern during her last episode to see if her ejection fraction has improved any.  If not we will go ahead and add Entresto at that time.    But for now we will continue to monitor.  Blood work will be done today for kidney function as well as CBC. She has planned lower extremity Dopplers  pending for her peripheral artery disease.  The patient is in agreement with the above plan. The patient left the office in stable condition.  The patient will follow up in 8 weeks or sooner if needed.   Medication Adjustments/Labs and Tests Ordered: Current medicines are reviewed at length with the patient today.  Concerns regarding medicines are outlined above.  Orders Placed This Encounter  Procedures  . Basic metabolic panel  . CBC  . Magnesium  . ECHOCARDIOGRAM COMPLETE   No orders of the defined types were placed in this encounter.   Patient Instructions  Medication Instructions:  Your physician recommends that you continue on your current medications as directed. Please refer to the Current Medication list given to you today.  *If you need a refill on your cardiac medications before your next appointment, please call your pharmacy*   Lab Work: TODAY: BMET, Mg, CBC If you have labs (blood work) drawn today and your tests are completely normal, you will receive your results only by: Marland Kitchen MyChart Message (if you have MyChart) OR . A paper copy in the mail If you have any lab test that is abnormal or we need to change your treatment, we will call you to review the results.   Testing/Procedures: Your physician has requested that you have an echocardiogram. Echocardiography is a painless test that uses sound waves to create images of your heart. It provides your doctor with information about the size and shape of your heart and how well your heart's chambers and valves are working. This procedure takes approximately one hour. There are no restrictions for this procedure.  Follow-Up: At [facility], you and your health needs are our priority.  As part of our continuing mission to provide you with exceptional heart care, we have created designated Provider Care Teams.  These Care Teams include your primary Cardiologist (physician) and Advanced Practice Providers (APPs -  Physician  Assistants and Nurse Practitioners) who all work together to provide you with the care you need, when you need it.  We recommend signing up for the patient portal called "MyChart".  Sign up information is provided on this After Visit Summary.  MyChart is used to connect with patients for Virtual Visits (Telemedicine).  Patients are able to view lab/test results, encounter notes, upcoming appointments, etc.  Non-urgent messages can be sent to your provider as well.   To learn more about what you can do with MyChart, go to [email or web address].    Your next appointment:   8 week(s)  The format for your next appointment:   In Person in [place]  Provider:   Berniece Salines, DO      Adopting a Healthy Lifestyle.  Know what a healthy weight is for you (roughly BMI <25) and aim to maintain this   Aim for 7+ servings of fruits and vegetables daily   65-80+ fluid ounces of water or unsweet tea for healthy kidneys   Limit to max 1 drink of alcohol per day; avoid smoking/tobacco   Limit animal fats in diet for cholesterol and heart health - choose grass fed whenever available   Avoid highly processed foods, and foods high in saturated/trans fats   Aim for low stress - take time to unwind and care for your mental health   Aim for 150 min of moderate intensity exercise weekly for heart health, and weights twice weekly for bone health   Aim for 7-9 hours of sleep daily   When it comes to diets, agreement about the perfect plan isnt easy to find, even among the experts. Experts at the [employer] developed an idea known as the Healthy Eating Plate. Just imagine a plate divided into logical, healthy portions.   The emphasis is on diet quality:   Load up on vegetables and fruits - one-half of your plate: Aim for color and variety, and remember that potatoes dont count.   Go for whole grains - one-quarter of your plate: Whole wheat, barley, wheat berries, quinoa, oats,  brown rice, and foods made with them. If you want pasta, go with whole wheat pasta.   Protein power - one-quarter of your plate: Fish, chicken, beans, and nuts are all healthy, versatile protein sources. Limit red meat.   The diet, however, does go beyond the plate, offering a few other suggestions.   Use healthy plant oils, such as olive, canola, soy, corn, sunflower and peanut. Check the labels, and avoid partially hydrogenated oil, which have unhealthy trans fats.   If youre thirsty, drink water. Coffee and tea are good in moderation, but skip sugary drinks and limit milk and dairy products to one or two daily servings.   The type of carbohydrate in the diet is more important than the amount. Some sources of carbohydrates, such as vegetables, fruits, whole grains, and beans-are healthier than others.   Finally, stay active  Signed, Berniece Salines, DO  01/02/2020 4:07 PM    [facility]

## 2020-01-02 NOTE — Patient Instructions (Signed)
Medication Instructions:  Your physician recommends that you continue on your current medications as directed. Please refer to the Current Medication list given to you today.  *If you need a refill on your cardiac medications before your next appointment, please call your pharmacy*   Lab Work: TODAY: BMET, Mg, CBC If you have labs (blood work) drawn today and your tests are completely normal, you will receive your results only by: Marland Kitchen MyChart Message (if you have MyChart) OR . A paper copy in the mail If you have any lab test that is abnormal or we need to change your treatment, we will call you to review the results.   Testing/Procedures: Your physician has requested that you have an echocardiogram. Echocardiography is a painless test that uses sound waves to create images of your heart. It provides your doctor with information about the size and shape of your heart and how well your heart's chambers and valves are working. This procedure takes approximately one hour. There are no restrictions for this procedure.  Follow-Up: At [facility], you and your health needs are our priority.  As part of our continuing mission to provide you with exceptional heart care, we have created designated Provider Care Teams.  These Care Teams include your primary Cardiologist (physician) and Advanced Practice Providers (APPs -  Physician Assistants and Nurse Practitioners) who all work together to provide you with the care you need, when you need it.  We recommend signing up for the patient portal called "MyChart".  Sign up information is provided on this After Visit Summary.  MyChart is used to connect with patients for Virtual Visits (Telemedicine).  Patients are able to view lab/test results, encounter notes, upcoming appointments, etc.  Non-urgent messages can be sent to your provider as well.   To learn more about what you can do with MyChart, go to [email or web address].    Your next appointment:    8 week(s)  The format for your next appointment:   In Person in [place]  Provider:   Berniece Salines, DO

## 2020-01-03 LAB — BASIC METABOLIC PANEL
BUN/Creatinine Ratio: 29 — ABNORMAL HIGH (ref 12–28)
BUN: 40 mg/dL — ABNORMAL HIGH (ref 8–27)
CO2: 29 mmol/L (ref 20–29)
Calcium: 10.2 mg/dL (ref 8.7–10.3)
Chloride: 99 mmol/L (ref 96–106)
Creatinine, Ser: 1.36 mg/dL — ABNORMAL HIGH (ref 0.57–1.00)
GFR calc Af Amer: 40 mL/min/{1.73_m2} — ABNORMAL LOW (ref >59)
GFR calc non Af Amer: 35 mL/min/{1.73_m2} — ABNORMAL LOW (ref >59)
Glucose: 93 mg/dL (ref 65–99)
Potassium: 5.4 mmol/L — ABNORMAL HIGH (ref 3.5–5.2)
Sodium: 142 mmol/L (ref 134–144)

## 2020-01-03 LAB — CBC
Hematocrit: 32.5 % — ABNORMAL LOW (ref 34.0–46.6)
Hemoglobin: 10.5 g/dL — ABNORMAL LOW (ref 11.1–15.9)
MCH: 29.9 pg (ref 26.6–33.0)
MCHC: 32.3 g/dL (ref 31.5–35.7)
MCV: 93 fL (ref 79–97)
Platelets: 325 10*3/uL (ref 150–450)
RBC: 3.51 x10E6/uL — ABNORMAL LOW (ref 3.77–5.28)
RDW: 12.5 % (ref 11.7–15.4)
WBC: 9.5 10*3/uL (ref 3.4–10.8)

## 2020-01-03 LAB — MAGNESIUM: Magnesium: 2.6 mg/dL — ABNORMAL HIGH (ref 1.6–2.3)

## 2020-01-04 DIAGNOSIS — Z79899 Other long term (current) drug therapy: Secondary | ICD-10-CM | POA: Diagnosis not present

## 2020-01-04 DIAGNOSIS — I2510 Atherosclerotic heart disease of native coronary artery without angina pectoris: Secondary | ICD-10-CM | POA: Diagnosis not present

## 2020-01-04 DIAGNOSIS — C50411 Malignant neoplasm of upper-outer quadrant of right female breast: Secondary | ICD-10-CM | POA: Diagnosis not present

## 2020-01-04 DIAGNOSIS — E43 Unspecified severe protein-calorie malnutrition: Secondary | ICD-10-CM | POA: Diagnosis not present

## 2020-01-04 DIAGNOSIS — I739 Peripheral vascular disease, unspecified: Secondary | ICD-10-CM | POA: Diagnosis not present

## 2020-01-04 DIAGNOSIS — Z952 Presence of prosthetic heart valve: Secondary | ICD-10-CM | POA: Diagnosis not present

## 2020-01-04 DIAGNOSIS — M1991 Primary osteoarthritis, unspecified site: Secondary | ICD-10-CM | POA: Diagnosis not present

## 2020-01-04 DIAGNOSIS — I272 Pulmonary hypertension, unspecified: Secondary | ICD-10-CM | POA: Diagnosis not present

## 2020-01-04 DIAGNOSIS — I5032 Chronic diastolic (congestive) heart failure: Secondary | ICD-10-CM | POA: Diagnosis not present

## 2020-01-04 DIAGNOSIS — I110 Hypertensive heart disease with heart failure: Secondary | ICD-10-CM | POA: Diagnosis not present

## 2020-01-04 DIAGNOSIS — E785 Hyperlipidemia, unspecified: Secondary | ICD-10-CM | POA: Diagnosis not present

## 2020-01-04 DIAGNOSIS — Z7902 Long term (current) use of antithrombotics/antiplatelets: Secondary | ICD-10-CM | POA: Diagnosis not present

## 2020-01-04 DIAGNOSIS — Z951 Presence of aortocoronary bypass graft: Secondary | ICD-10-CM | POA: Diagnosis not present

## 2020-01-04 DIAGNOSIS — R5382 Chronic fatigue, unspecified: Secondary | ICD-10-CM | POA: Diagnosis not present

## 2020-01-04 DIAGNOSIS — I350 Nonrheumatic aortic (valve) stenosis: Secondary | ICD-10-CM | POA: Diagnosis not present

## 2020-01-04 DIAGNOSIS — I5023 Acute on chronic systolic (congestive) heart failure: Secondary | ICD-10-CM | POA: Diagnosis not present

## 2020-01-04 DIAGNOSIS — Z7982 Long term (current) use of aspirin: Secondary | ICD-10-CM | POA: Diagnosis not present

## 2020-01-04 DIAGNOSIS — Z87891 Personal history of nicotine dependence: Secondary | ICD-10-CM | POA: Diagnosis not present

## 2020-01-04 DIAGNOSIS — I255 Ischemic cardiomyopathy: Secondary | ICD-10-CM | POA: Diagnosis not present

## 2020-01-07 DIAGNOSIS — I509 Heart failure, unspecified: Secondary | ICD-10-CM | POA: Diagnosis not present

## 2020-01-08 ENCOUNTER — Encounter: Payer: Medicare Other | Admitting: Surgery

## 2020-01-08 ENCOUNTER — Encounter (HOSPITAL_COMMUNITY): Payer: Medicare Other

## 2020-01-11 DIAGNOSIS — Z7902 Long term (current) use of antithrombotics/antiplatelets: Secondary | ICD-10-CM | POA: Diagnosis not present

## 2020-01-11 DIAGNOSIS — I255 Ischemic cardiomyopathy: Secondary | ICD-10-CM | POA: Diagnosis not present

## 2020-01-11 DIAGNOSIS — I110 Hypertensive heart disease with heart failure: Secondary | ICD-10-CM | POA: Diagnosis not present

## 2020-01-11 DIAGNOSIS — I272 Pulmonary hypertension, unspecified: Secondary | ICD-10-CM | POA: Diagnosis not present

## 2020-01-11 DIAGNOSIS — I2510 Atherosclerotic heart disease of native coronary artery without angina pectoris: Secondary | ICD-10-CM | POA: Diagnosis not present

## 2020-01-11 DIAGNOSIS — I739 Peripheral vascular disease, unspecified: Secondary | ICD-10-CM | POA: Diagnosis not present

## 2020-01-11 DIAGNOSIS — I350 Nonrheumatic aortic (valve) stenosis: Secondary | ICD-10-CM | POA: Diagnosis not present

## 2020-01-11 DIAGNOSIS — Z951 Presence of aortocoronary bypass graft: Secondary | ICD-10-CM | POA: Diagnosis not present

## 2020-01-11 DIAGNOSIS — R5382 Chronic fatigue, unspecified: Secondary | ICD-10-CM | POA: Diagnosis not present

## 2020-01-11 DIAGNOSIS — M1991 Primary osteoarthritis, unspecified site: Secondary | ICD-10-CM | POA: Diagnosis not present

## 2020-01-11 DIAGNOSIS — E43 Unspecified severe protein-calorie malnutrition: Secondary | ICD-10-CM | POA: Diagnosis not present

## 2020-01-11 DIAGNOSIS — Z87891 Personal history of nicotine dependence: Secondary | ICD-10-CM | POA: Diagnosis not present

## 2020-01-11 DIAGNOSIS — I5032 Chronic diastolic (congestive) heart failure: Secondary | ICD-10-CM | POA: Diagnosis not present

## 2020-01-11 DIAGNOSIS — Z952 Presence of prosthetic heart valve: Secondary | ICD-10-CM | POA: Diagnosis not present

## 2020-01-11 DIAGNOSIS — C50411 Malignant neoplasm of upper-outer quadrant of right female breast: Secondary | ICD-10-CM | POA: Diagnosis not present

## 2020-01-11 DIAGNOSIS — Z7982 Long term (current) use of aspirin: Secondary | ICD-10-CM | POA: Diagnosis not present

## 2020-01-11 DIAGNOSIS — E785 Hyperlipidemia, unspecified: Secondary | ICD-10-CM | POA: Diagnosis not present

## 2020-01-11 DIAGNOSIS — Z79899 Other long term (current) drug therapy: Secondary | ICD-10-CM | POA: Diagnosis not present

## 2020-01-11 DIAGNOSIS — I5023 Acute on chronic systolic (congestive) heart failure: Secondary | ICD-10-CM | POA: Diagnosis not present

## 2020-01-16 DIAGNOSIS — I771 Stricture of artery: Secondary | ICD-10-CM | POA: Diagnosis not present

## 2020-01-16 DIAGNOSIS — I708 Atherosclerosis of other arteries: Secondary | ICD-10-CM | POA: Diagnosis not present

## 2020-01-16 DIAGNOSIS — I2510 Atherosclerotic heart disease of native coronary artery without angina pectoris: Secondary | ICD-10-CM | POA: Diagnosis not present

## 2020-01-16 DIAGNOSIS — I739 Peripheral vascular disease, unspecified: Secondary | ICD-10-CM | POA: Diagnosis not present

## 2020-01-18 DIAGNOSIS — I2510 Atherosclerotic heart disease of native coronary artery without angina pectoris: Secondary | ICD-10-CM | POA: Diagnosis not present

## 2020-01-18 DIAGNOSIS — M1991 Primary osteoarthritis, unspecified site: Secondary | ICD-10-CM | POA: Diagnosis not present

## 2020-01-18 DIAGNOSIS — I5023 Acute on chronic systolic (congestive) heart failure: Secondary | ICD-10-CM | POA: Diagnosis not present

## 2020-01-18 DIAGNOSIS — C50411 Malignant neoplasm of upper-outer quadrant of right female breast: Secondary | ICD-10-CM | POA: Diagnosis not present

## 2020-01-18 DIAGNOSIS — E43 Unspecified severe protein-calorie malnutrition: Secondary | ICD-10-CM | POA: Diagnosis not present

## 2020-01-18 DIAGNOSIS — R5382 Chronic fatigue, unspecified: Secondary | ICD-10-CM | POA: Diagnosis not present

## 2020-01-18 DIAGNOSIS — Z951 Presence of aortocoronary bypass graft: Secondary | ICD-10-CM | POA: Diagnosis not present

## 2020-01-18 DIAGNOSIS — Z952 Presence of prosthetic heart valve: Secondary | ICD-10-CM | POA: Diagnosis not present

## 2020-01-18 DIAGNOSIS — Z7982 Long term (current) use of aspirin: Secondary | ICD-10-CM | POA: Diagnosis not present

## 2020-01-18 DIAGNOSIS — Z7902 Long term (current) use of antithrombotics/antiplatelets: Secondary | ICD-10-CM | POA: Diagnosis not present

## 2020-01-18 DIAGNOSIS — I272 Pulmonary hypertension, unspecified: Secondary | ICD-10-CM | POA: Diagnosis not present

## 2020-01-18 DIAGNOSIS — E785 Hyperlipidemia, unspecified: Secondary | ICD-10-CM | POA: Diagnosis not present

## 2020-01-18 DIAGNOSIS — I5032 Chronic diastolic (congestive) heart failure: Secondary | ICD-10-CM | POA: Diagnosis not present

## 2020-01-18 DIAGNOSIS — I350 Nonrheumatic aortic (valve) stenosis: Secondary | ICD-10-CM | POA: Diagnosis not present

## 2020-01-18 DIAGNOSIS — I739 Peripheral vascular disease, unspecified: Secondary | ICD-10-CM | POA: Diagnosis not present

## 2020-01-18 DIAGNOSIS — Z87891 Personal history of nicotine dependence: Secondary | ICD-10-CM | POA: Diagnosis not present

## 2020-01-18 DIAGNOSIS — I255 Ischemic cardiomyopathy: Secondary | ICD-10-CM | POA: Diagnosis not present

## 2020-01-18 DIAGNOSIS — I110 Hypertensive heart disease with heart failure: Secondary | ICD-10-CM | POA: Diagnosis not present

## 2020-01-18 DIAGNOSIS — Z79899 Other long term (current) drug therapy: Secondary | ICD-10-CM | POA: Diagnosis not present

## 2020-01-23 ENCOUNTER — Ambulatory Visit: Payer: Medicare Other | Admitting: Sports Medicine

## 2020-01-23 ENCOUNTER — Encounter: Payer: Self-pay | Admitting: Sports Medicine

## 2020-01-23 DIAGNOSIS — B359 Dermatophytosis, unspecified: Secondary | ICD-10-CM

## 2020-01-23 DIAGNOSIS — M79675 Pain in left toe(s): Secondary | ICD-10-CM

## 2020-01-23 DIAGNOSIS — M79674 Pain in right toe(s): Secondary | ICD-10-CM | POA: Diagnosis not present

## 2020-01-23 DIAGNOSIS — I739 Peripheral vascular disease, unspecified: Secondary | ICD-10-CM

## 2020-01-23 DIAGNOSIS — L309 Dermatitis, unspecified: Secondary | ICD-10-CM

## 2020-01-23 DIAGNOSIS — L84 Corns and callosities: Secondary | ICD-10-CM | POA: Diagnosis not present

## 2020-01-23 MED ORDER — CLOTRIMAZOLE 1 % EX SOLN: 1.0000 "application " | Freq: Every day | CUTANEOUS | 5 refills | Status: DC

## 2020-01-23 MED ORDER — CICLOPIROX OLAMINE 0.77 % EX CREA: TOPICAL_CREAM | Freq: Two times a day (BID) | CUTANEOUS | 0 refills | Status: DC

## 2020-01-23 NOTE — Progress Notes (Signed)
Subjective: Olivia [name] is a 86 y.o. female patient who presents to office for evaluation of red discoloration to toes especially worse on the right and pain around toes and toenails.  Patient reports that she was seen by PCP who started her on antibiotics was not sure if there was any infection patient has had issues with peeling skin and soreness to toes and toenails and also has poor circulation will be going for a vascular procedure on next month.  Patient denies nausea vomiting fever chills any active drainage or any other constitutional symptoms at this time.  Patient is assisted by family member at this visit  Review of system noncontributory  Patient Active Problem List   Diagnosis Date Noted  . Breast cancer ([place])   . CAD (coronary artery disease)   . Chronic systolic heart failure ([place])   . Hyperlipidemia with target LDL less than 70   . Hypertension   . Malignant neoplasm of upper-outer quadrant of right female breast ([place])   . PVD (peripheral vascular disease) ([place])   . Senile osteoporosis   . Dyspnea   . Protein-calorie malnutrition, severe 11/23/2019  . Elevated troponin   . Age-related osteoporosis without current pathological fracture 10/29/2019  . Long-term use of aspirin therapy 07/20/2017  . Mixed hyperlipidemia 05/28/2017  . Old MI (myocardial infarction) 05/28/2017  . Palpitations 05/28/2017  . CHF (congestive heart failure), NYHA class III, acute, systolic ([place]) 11/26/8525  . Ischemic dilated cardiomyopathy ([place]) 03/15/2017  . Nonrheumatic aortic valve insufficiency 03/15/2017  . Non-rheumatic mitral regurgitation 03/15/2017  . S/P CABG (coronary artery bypass graft) 03/15/2017  . Statin intolerance 03/15/2017  . Dysphagia as late effect of cerebrovascular accident (CVA) 04/02/2016  . Drug-induced skin rash 03/28/2016  . Embolic stroke involving right middle cerebral artery ([place]) 03/25/2016  . Atherosclerotic heart disease of native coronary artery without  angina pectoris 10/02/2014  . Essential (primary) hypertension 10/02/2014  . Other specified postprocedural states 10/02/2014  . History of mitral valve repair 2008  . Hx of CABG 2008    Current Outpatient Medications on File Prior to Visit  Medication Sig Dispense Refill  . albuterol (VENTOLIN HFA) 108 (90 Base) MCG/ACT inhaler Inhale 1 puff into the lungs every 6 (six) hours as needed for shortness of breath.    Marland Kitchen amoxicillin (AMOXIL) 500 MG capsule Take 500 mg by mouth 3 (three) times daily.    Marland Kitchen aspirin 81 MG chewable tablet  (Patient not taking: Reported on 01/02/2020)    . aspirin EC 81 MG EC tablet Take 1 tablet (81 mg total) by mouth daily. Swallow whole. 30 tablet 11  . clindamycin (CLEOCIN) 300 MG capsule Take 300 mg by mouth 3 (three) times daily.    . clopidogrel (PLAVIX) 75 MG tablet Take 75 mg by mouth daily.    . furosemide (LASIX) 20 MG tablet Take 1 tablet (20 mg total) by mouth daily. 30 tablet 1  . levothyroxine (SYNTHROID) 50 MCG tablet Take 50 mcg by mouth daily.    . metoprolol succinate (TOPROL-XL) 25 MG 24 hr tablet Take 25 mg by mouth daily.    . Metoprolol Tartrate 75 MG TABS Take 75 mg by mouth 2 (two) times daily. 60 tablet 1  . nitroGLYCERIN (NITROSTAT) 0.4 MG SL tablet Place 0.4 mg under the tongue every 5 (five) minutes as needed for chest pain.    Marland Kitchen ondansetron (ZOFRAN ODT) 4 MG disintegrating tablet Take 1 tablet (4 mg total) by mouth every 8 (eight) hours  as needed for nausea or vomiting. 20 tablet 0  . ondansetron (ZOFRAN) 4 MG tablet Take 4 mg by mouth every 8 (eight) hours as needed.    . pantoprazole (PROTONIX) 40 MG tablet Take 40 mg by mouth daily.    . predniSONE (DELTASONE) 10 MG tablet Take by mouth.    . promethazine (PHENERGAN) 25 MG tablet Take 1 tablet (25 mg total) by mouth every 6 (six) hours as needed for nausea or vomiting. 30 tablet 0  . Red Yeast Rice Extract (RED YEAST RICE PO) Take 1 tablet by mouth at bedtime.    Marland Kitchen spironolactone  (ALDACTONE) 25 MG tablet Take 0.5 tablets (12.5 mg total) by mouth daily. 30 tablet 0  . tamsulosin (FLOMAX) 0.4 MG CAPS capsule Take 0.4 mg by mouth daily. (Patient not taking: Reported on 01/02/2020)    . traMADol (ULTRAM) 50 MG tablet Take 50 mg by mouth every 6 (six) hours as needed.    . traZODone (DESYREL) 50 MG tablet  (Patient not taking: Reported on 01/02/2020)    . vitamin E (VITAMIN E) 180 MG (400 UNITS) capsule Take 400 Units by mouth daily.     No current facility-administered medications on file prior to visit.    Allergies  Allergen Reactions  . Iodinated Diagnostic Agents Rash  . Ticagrelor Rash  . Statins Other (See Comments)    myalgias     Objective:  General: Alert and oriented x3 in no acute distress  Dermatology: Skin are short and thick with no acute ingrowing noted.  There is significant dry scaly skin interspaces and a small raised rash noted to the dorsum of the foot right greater than left extending to the toes likely consistent with dermatitis.  Preulcerative callus noted to right heel noted signs of infection.  Vascular: Dorsalis Pedis and Posterior Tibial pedal pulses nonpalpable, Capillary Fill Time 5 seconds,(+) dependent rubor, scant pedal hair growth bilateral, no significant edema bilateral lower extremities, Temperature gradient decreased bilateral.  Neurology: Gross sensation intact via light touch bilateral.  Musculoskeletal: Mild tenderness with palpation all toes bilateral no acute signs of gangrene.  There is significant bunion and hammertoe deformity noted.  Pes planus deformity noted.   Assessment and Plan: Problem List Items Addressed This Visit   None   Visit Diagnoses    Dermatitis    -  Primary   Tinea       Relevant Medications   clindamycin (CLEOCIN) 300 MG capsule   clotrimazole (LOTRIMIN) 1 % external solution   ciclopirox (LOPROX) 0.77 % cream   PAD (peripheral artery disease) (HCC)       Relevant Medications   metoprolol  succinate (TOPROL-XL) 25 MG 24 hr tablet   Toe pain, bilateral       Pre-ulcerative calluses         -Complete examination performed -Discussed treatment options dermatitis and PAD -Rx clotrimazole solution for patient to use in between toes and to toenails and prescribe Loprox cream to use to rash -May continue with soaking with Epson salt and use creams afterwards -Applied heel offloading pad to right shoe and advised to use And Band-Aid dressing to area of preulcerative callus to heel -Patient to return to office as scheduled for medication check or sooner if condition worsens.  Landis Martins, DPM

## 2020-01-24 DIAGNOSIS — I5032 Chronic diastolic (congestive) heart failure: Secondary | ICD-10-CM | POA: Diagnosis not present

## 2020-01-24 DIAGNOSIS — R5382 Chronic fatigue, unspecified: Secondary | ICD-10-CM | POA: Diagnosis not present

## 2020-01-24 DIAGNOSIS — M1991 Primary osteoarthritis, unspecified site: Secondary | ICD-10-CM | POA: Diagnosis not present

## 2020-01-24 DIAGNOSIS — C50411 Malignant neoplasm of upper-outer quadrant of right female breast: Secondary | ICD-10-CM | POA: Diagnosis not present

## 2020-01-24 DIAGNOSIS — I272 Pulmonary hypertension, unspecified: Secondary | ICD-10-CM | POA: Diagnosis not present

## 2020-01-24 DIAGNOSIS — Z87891 Personal history of nicotine dependence: Secondary | ICD-10-CM | POA: Diagnosis not present

## 2020-01-24 DIAGNOSIS — Z952 Presence of prosthetic heart valve: Secondary | ICD-10-CM | POA: Diagnosis not present

## 2020-01-24 DIAGNOSIS — I110 Hypertensive heart disease with heart failure: Secondary | ICD-10-CM | POA: Diagnosis not present

## 2020-01-24 DIAGNOSIS — E43 Unspecified severe protein-calorie malnutrition: Secondary | ICD-10-CM | POA: Diagnosis not present

## 2020-01-24 DIAGNOSIS — Z951 Presence of aortocoronary bypass graft: Secondary | ICD-10-CM | POA: Diagnosis not present

## 2020-01-24 DIAGNOSIS — Z79899 Other long term (current) drug therapy: Secondary | ICD-10-CM | POA: Diagnosis not present

## 2020-01-24 DIAGNOSIS — I2510 Atherosclerotic heart disease of native coronary artery without angina pectoris: Secondary | ICD-10-CM | POA: Diagnosis not present

## 2020-01-24 DIAGNOSIS — E785 Hyperlipidemia, unspecified: Secondary | ICD-10-CM | POA: Diagnosis not present

## 2020-01-24 DIAGNOSIS — I5023 Acute on chronic systolic (congestive) heart failure: Secondary | ICD-10-CM | POA: Diagnosis not present

## 2020-01-24 DIAGNOSIS — I255 Ischemic cardiomyopathy: Secondary | ICD-10-CM | POA: Diagnosis not present

## 2020-01-24 DIAGNOSIS — I739 Peripheral vascular disease, unspecified: Secondary | ICD-10-CM | POA: Diagnosis not present

## 2020-01-24 DIAGNOSIS — Z7982 Long term (current) use of aspirin: Secondary | ICD-10-CM | POA: Diagnosis not present

## 2020-01-24 DIAGNOSIS — Z7902 Long term (current) use of antithrombotics/antiplatelets: Secondary | ICD-10-CM | POA: Diagnosis not present

## 2020-01-24 DIAGNOSIS — I350 Nonrheumatic aortic (valve) stenosis: Secondary | ICD-10-CM | POA: Diagnosis not present

## 2020-01-24 NOTE — Addendum Note (Signed)
Addended by: Cranford Mon R on: 01/24/2020 10:35 AM   Modules accepted: Orders

## 2020-01-31 DIAGNOSIS — Z7902 Long term (current) use of antithrombotics/antiplatelets: Secondary | ICD-10-CM | POA: Diagnosis not present

## 2020-01-31 DIAGNOSIS — R5382 Chronic fatigue, unspecified: Secondary | ICD-10-CM | POA: Diagnosis not present

## 2020-01-31 DIAGNOSIS — Z7982 Long term (current) use of aspirin: Secondary | ICD-10-CM | POA: Diagnosis not present

## 2020-01-31 DIAGNOSIS — I2510 Atherosclerotic heart disease of native coronary artery without angina pectoris: Secondary | ICD-10-CM | POA: Diagnosis not present

## 2020-01-31 DIAGNOSIS — Z79899 Other long term (current) drug therapy: Secondary | ICD-10-CM | POA: Diagnosis not present

## 2020-01-31 DIAGNOSIS — E785 Hyperlipidemia, unspecified: Secondary | ICD-10-CM | POA: Diagnosis not present

## 2020-01-31 DIAGNOSIS — C50411 Malignant neoplasm of upper-outer quadrant of right female breast: Secondary | ICD-10-CM | POA: Diagnosis not present

## 2020-01-31 DIAGNOSIS — I739 Peripheral vascular disease, unspecified: Secondary | ICD-10-CM | POA: Diagnosis not present

## 2020-01-31 DIAGNOSIS — I110 Hypertensive heart disease with heart failure: Secondary | ICD-10-CM | POA: Diagnosis not present

## 2020-01-31 DIAGNOSIS — M1991 Primary osteoarthritis, unspecified site: Secondary | ICD-10-CM | POA: Diagnosis not present

## 2020-01-31 DIAGNOSIS — I5032 Chronic diastolic (congestive) heart failure: Secondary | ICD-10-CM | POA: Diagnosis not present

## 2020-01-31 DIAGNOSIS — I350 Nonrheumatic aortic (valve) stenosis: Secondary | ICD-10-CM | POA: Diagnosis not present

## 2020-01-31 DIAGNOSIS — I255 Ischemic cardiomyopathy: Secondary | ICD-10-CM | POA: Diagnosis not present

## 2020-01-31 DIAGNOSIS — Z87891 Personal history of nicotine dependence: Secondary | ICD-10-CM | POA: Diagnosis not present

## 2020-01-31 DIAGNOSIS — I272 Pulmonary hypertension, unspecified: Secondary | ICD-10-CM | POA: Diagnosis not present

## 2020-01-31 DIAGNOSIS — Z951 Presence of aortocoronary bypass graft: Secondary | ICD-10-CM | POA: Diagnosis not present

## 2020-01-31 DIAGNOSIS — E43 Unspecified severe protein-calorie malnutrition: Secondary | ICD-10-CM | POA: Diagnosis not present

## 2020-01-31 DIAGNOSIS — Z952 Presence of prosthetic heart valve: Secondary | ICD-10-CM | POA: Diagnosis not present

## 2020-01-31 DIAGNOSIS — I5023 Acute on chronic systolic (congestive) heart failure: Secondary | ICD-10-CM | POA: Diagnosis not present

## 2020-02-01 ENCOUNTER — Other Ambulatory Visit: Payer: Self-pay | Admitting: Cardiology

## 2020-02-01 ENCOUNTER — Other Ambulatory Visit: Payer: Self-pay | Admitting: Sports Medicine

## 2020-02-07 DIAGNOSIS — I509 Heart failure, unspecified: Secondary | ICD-10-CM | POA: Diagnosis not present

## 2020-02-08 ENCOUNTER — Other Ambulatory Visit: Payer: Medicare Other

## 2020-02-08 ENCOUNTER — Other Ambulatory Visit (HOSPITAL_BASED_OUTPATIENT_CLINIC_OR_DEPARTMENT_OTHER): Payer: Medicare Other

## 2020-02-08 DIAGNOSIS — C50411 Malignant neoplasm of upper-outer quadrant of right female breast: Secondary | ICD-10-CM | POA: Diagnosis not present

## 2020-02-08 DIAGNOSIS — Z7902 Long term (current) use of antithrombotics/antiplatelets: Secondary | ICD-10-CM | POA: Diagnosis not present

## 2020-02-08 DIAGNOSIS — I110 Hypertensive heart disease with heart failure: Secondary | ICD-10-CM | POA: Diagnosis not present

## 2020-02-08 DIAGNOSIS — I5032 Chronic diastolic (congestive) heart failure: Secondary | ICD-10-CM | POA: Diagnosis not present

## 2020-02-08 DIAGNOSIS — Z7982 Long term (current) use of aspirin: Secondary | ICD-10-CM | POA: Diagnosis not present

## 2020-02-08 DIAGNOSIS — E785 Hyperlipidemia, unspecified: Secondary | ICD-10-CM | POA: Diagnosis not present

## 2020-02-08 DIAGNOSIS — Z951 Presence of aortocoronary bypass graft: Secondary | ICD-10-CM | POA: Diagnosis not present

## 2020-02-08 DIAGNOSIS — I272 Pulmonary hypertension, unspecified: Secondary | ICD-10-CM | POA: Diagnosis not present

## 2020-02-08 DIAGNOSIS — E43 Unspecified severe protein-calorie malnutrition: Secondary | ICD-10-CM | POA: Diagnosis not present

## 2020-02-08 DIAGNOSIS — Z87891 Personal history of nicotine dependence: Secondary | ICD-10-CM | POA: Diagnosis not present

## 2020-02-08 DIAGNOSIS — I350 Nonrheumatic aortic (valve) stenosis: Secondary | ICD-10-CM | POA: Diagnosis not present

## 2020-02-08 DIAGNOSIS — Z952 Presence of prosthetic heart valve: Secondary | ICD-10-CM | POA: Diagnosis not present

## 2020-02-08 DIAGNOSIS — I255 Ischemic cardiomyopathy: Secondary | ICD-10-CM | POA: Diagnosis not present

## 2020-02-08 DIAGNOSIS — R5382 Chronic fatigue, unspecified: Secondary | ICD-10-CM | POA: Diagnosis not present

## 2020-02-08 DIAGNOSIS — I2510 Atherosclerotic heart disease of native coronary artery without angina pectoris: Secondary | ICD-10-CM | POA: Diagnosis not present

## 2020-02-08 DIAGNOSIS — M1991 Primary osteoarthritis, unspecified site: Secondary | ICD-10-CM | POA: Diagnosis not present

## 2020-02-08 DIAGNOSIS — Z79899 Other long term (current) drug therapy: Secondary | ICD-10-CM | POA: Diagnosis not present

## 2020-02-08 DIAGNOSIS — I739 Peripheral vascular disease, unspecified: Secondary | ICD-10-CM | POA: Diagnosis not present

## 2020-02-08 DIAGNOSIS — I5023 Acute on chronic systolic (congestive) heart failure: Secondary | ICD-10-CM | POA: Diagnosis not present

## 2020-02-09 DIAGNOSIS — I6990 Unspecified sequelae of unspecified cerebrovascular disease: Secondary | ICD-10-CM | POA: Diagnosis not present

## 2020-02-09 DIAGNOSIS — I2510 Atherosclerotic heart disease of native coronary artery without angina pectoris: Secondary | ICD-10-CM | POA: Diagnosis not present

## 2020-02-09 DIAGNOSIS — E785 Hyperlipidemia, unspecified: Secondary | ICD-10-CM | POA: Diagnosis not present

## 2020-02-09 DIAGNOSIS — Z955 Presence of coronary angioplasty implant and graft: Secondary | ICD-10-CM | POA: Diagnosis not present

## 2020-02-09 DIAGNOSIS — E039 Hypothyroidism, unspecified: Secondary | ICD-10-CM | POA: Diagnosis not present

## 2020-02-09 DIAGNOSIS — I5022 Chronic systolic (congestive) heart failure: Secondary | ICD-10-CM | POA: Diagnosis not present

## 2020-02-09 DIAGNOSIS — I480 Paroxysmal atrial fibrillation: Secondary | ICD-10-CM | POA: Diagnosis not present

## 2020-02-09 DIAGNOSIS — D62 Acute posthemorrhagic anemia: Secondary | ICD-10-CM | POA: Diagnosis not present

## 2020-02-09 NOTE — Telephone Encounter (Signed)
Please advise 

## 2020-02-13 ENCOUNTER — Other Ambulatory Visit: Payer: Self-pay

## 2020-02-13 ENCOUNTER — Ambulatory Visit: Payer: Medicare Other | Admitting: Sports Medicine

## 2020-02-13 ENCOUNTER — Encounter: Payer: Self-pay | Admitting: Sports Medicine

## 2020-02-13 DIAGNOSIS — I739 Peripheral vascular disease, unspecified: Secondary | ICD-10-CM

## 2020-02-13 DIAGNOSIS — L84 Corns and callosities: Secondary | ICD-10-CM

## 2020-02-13 DIAGNOSIS — B359 Dermatophytosis, unspecified: Secondary | ICD-10-CM | POA: Diagnosis not present

## 2020-02-13 DIAGNOSIS — M79675 Pain in left toe(s): Secondary | ICD-10-CM | POA: Diagnosis not present

## 2020-02-13 DIAGNOSIS — M79674 Pain in right toe(s): Secondary | ICD-10-CM

## 2020-02-13 DIAGNOSIS — L309 Dermatitis, unspecified: Secondary | ICD-10-CM

## 2020-02-13 MED ORDER — AMOXICILLIN-POT CLAVULANATE 875-125 MG PO TABS: 1.0000 | ORAL_TABLET | Freq: Two times a day (BID) | ORAL | 0 refills | Status: DC

## 2020-02-13 NOTE — Patient Instructions (Addendum)
Follow up with vascular doctor Call: [phone] Or follow up with Dr. Harriet Masson (cardiologist)  03/04/2020 11:00 AM Tobb, Godfrey Pick, DO [facility]

## 2020-02-13 NOTE — Progress Notes (Signed)
Subjective: Olivia [name] is a 87 y.o. female patient who returns to the office for follow-up evaluation discoloration and pain to the toes reports that the redness is getting better but the pain is not.  Patient is assisted by son who reports that she went to the vascular doctor and they were thinking that she would need a procedure but no one has called her back to schedule follow-up.    Patient Active Problem List   Diagnosis Date Noted  . Breast cancer ([place])   . CAD (coronary artery disease)   . Chronic systolic heart failure ([place])   . Hyperlipidemia with target LDL less than 70   . Hypertension   . Malignant neoplasm of upper-outer quadrant of right female breast ([place])   . PVD (peripheral vascular disease) ([place])   . Senile osteoporosis   . Dyspnea   . Protein-calorie malnutrition, severe 11/23/2019  . Elevated troponin   . Age-related osteoporosis without current pathological fracture 10/29/2019  . Long-term use of aspirin therapy 07/20/2017  . Mixed hyperlipidemia 05/28/2017  . Old MI (myocardial infarction) 05/28/2017  . Palpitations 05/28/2017  . CHF (congestive heart failure), NYHA class III, acute, systolic ([place]) 44/04/4740  . Ischemic dilated cardiomyopathy ([place]) 03/15/2017  . Nonrheumatic aortic valve insufficiency 03/15/2017  . Non-rheumatic mitral regurgitation 03/15/2017  . S/P CABG (coronary artery bypass graft) 03/15/2017  . Statin intolerance 03/15/2017  . Dysphagia as late effect of cerebrovascular accident (CVA) 04/02/2016  . Drug-induced skin rash 03/28/2016  . Embolic stroke involving right middle cerebral artery ([place]) 03/25/2016  . Atherosclerotic heart disease of native coronary artery without angina pectoris 10/02/2014  . Essential (primary) hypertension 10/02/2014  . Other specified postprocedural states 10/02/2014  . History of mitral valve repair 2008  . Hx of CABG 2008    Current Outpatient Medications on File Prior to Visit  Medication Sig  Dispense Refill  . albuterol (VENTOLIN HFA) 108 (90 Base) MCG/ACT inhaler Inhale 1 puff into the lungs every 6 (six) hours as needed for shortness of breath.    Marland Kitchen aspirin EC 81 MG EC tablet Take 1 tablet (81 mg total) by mouth daily. Swallow whole. 30 tablet 11  . ciclopirox (LOPROX) 0.77 % cream APPLY  CREAM TOPICALLY TWICE DAILY 15 g 0  . clindamycin (CLEOCIN) 300 MG capsule Take 300 mg by mouth 3 (three) times daily.    . clopidogrel (PLAVIX) 75 MG tablet Take 75 mg by mouth daily.    . clotrimazole (LOTRIMIN) 1 % external solution Apply 1 application topically daily. In between toes and to toenails 60 mL 5  . furosemide (LASIX) 20 MG tablet Take 1 tablet by mouth once daily 90 tablet 3  . levothyroxine (SYNTHROID) 50 MCG tablet Take 50 mcg by mouth daily.    . Metoprolol Tartrate 75 MG TABS Take 75 mg by mouth 2 (two) times daily. 60 tablet 1  . nitroGLYCERIN (NITROSTAT) 0.4 MG SL tablet Place 0.4 mg under the tongue every 5 (five) minutes as needed for chest pain.    Marland Kitchen ondansetron (ZOFRAN ODT) 4 MG disintegrating tablet Take 1 tablet (4 mg total) by mouth every 8 (eight) hours as needed for nausea or vomiting. 20 tablet 0  . pantoprazole (PROTONIX) 40 MG tablet Take 40 mg by mouth daily.    . promethazine (PHENERGAN) 25 MG tablet Take 1 tablet (25 mg total) by mouth every 6 (six) hours as needed for nausea or vomiting. 30 tablet 0  . Red Yeast Rice Extract (  RED YEAST RICE PO) Take 1 tablet by mouth at bedtime.    Marland Kitchen spironolactone (ALDACTONE) 25 MG tablet Take 0.5 tablets (12.5 mg total) by mouth daily. 30 tablet 0  . traMADol (ULTRAM) 50 MG tablet Take 50 mg by mouth every 6 (six) hours as needed.    . traZODone (DESYREL) 50 MG tablet  (Patient not taking: Reported on 01/02/2020)    . vitamin E (VITAMIN E) 180 MG (400 UNITS) capsule Take 400 Units by mouth daily.     No current facility-administered medications on file prior to visit.    Allergies  Allergen Reactions  . Iodinated  Diagnostic Agents Rash  . Ticagrelor Rash  . Statins Other (See Comments)    myalgias     Objective:  General: Alert and oriented x3 in no acute distress  Dermatology: Skin are short and thick with no acute ingrowing noted.  There is significant dry scaly skin interspaces and a small raised rash noted to the dorsum of the foot right greater than left extending to the toes likely consistent with dermatitis likely for now moist in nature with increased blanchable erythema and mild warmth.  Preulcerative callus noted to right heel noted signs of infection.  Vascular: Dorsalis Pedis and Posterior Tibial pedal pulses nonpalpable, Capillary Fill Time 5 seconds,(+) dependent rubor, scant pedal hair growth bilateral, no significant edema bilateral lower extremities, Temperature gradient decreased bilateral.  Neurology: Gross sensation intact via light touch bilateral.  Musculoskeletal: Mild tenderness with palpation all toes bilateral no acute signs of gangrene.  There is significant bunion and hammertoe deformity noted.  Pes planus deformity noted.   Assessment and Plan: Problem List Items Addressed This Visit   None   Visit Diagnoses    Dermatitis    -  Primary   Tinea       PAD (peripheral artery disease) ([place])       Toe pain, bilateral       Pre-ulcerative calluses         -Complete examination performed -Discussed treatment options dermatitis and PAD and preulcerative nature of skin changes -Prescribed Augmentin for patient to take as instructed -Continue with clotrimazole solution for patient to use in between toes and to toenails and prescribe Loprox cream to use to rash now every other day -May continue with soaking with Epson salt and use creams afterwards -Applied heel offloading pad to right shoe and advised to use with Band-Aid and antibiotic cream to area -Advised patient to follow-up with vascular doctor because her pain could likely be due to PAD -Patient to return to  office in 3 weeks or sooner if condition worsens.  Landis Martins, DPM

## 2020-02-16 DIAGNOSIS — Z7982 Long term (current) use of aspirin: Secondary | ICD-10-CM | POA: Diagnosis not present

## 2020-02-16 DIAGNOSIS — C50411 Malignant neoplasm of upper-outer quadrant of right female breast: Secondary | ICD-10-CM | POA: Diagnosis not present

## 2020-02-16 DIAGNOSIS — Z951 Presence of aortocoronary bypass graft: Secondary | ICD-10-CM | POA: Diagnosis not present

## 2020-02-16 DIAGNOSIS — Z7902 Long term (current) use of antithrombotics/antiplatelets: Secondary | ICD-10-CM | POA: Diagnosis not present

## 2020-02-16 DIAGNOSIS — M1991 Primary osteoarthritis, unspecified site: Secondary | ICD-10-CM | POA: Diagnosis not present

## 2020-02-16 DIAGNOSIS — E43 Unspecified severe protein-calorie malnutrition: Secondary | ICD-10-CM | POA: Diagnosis not present

## 2020-02-16 DIAGNOSIS — I272 Pulmonary hypertension, unspecified: Secondary | ICD-10-CM | POA: Diagnosis not present

## 2020-02-16 DIAGNOSIS — Z79899 Other long term (current) drug therapy: Secondary | ICD-10-CM | POA: Diagnosis not present

## 2020-02-16 DIAGNOSIS — E785 Hyperlipidemia, unspecified: Secondary | ICD-10-CM | POA: Diagnosis not present

## 2020-02-16 DIAGNOSIS — I5032 Chronic diastolic (congestive) heart failure: Secondary | ICD-10-CM | POA: Diagnosis not present

## 2020-02-16 DIAGNOSIS — I5023 Acute on chronic systolic (congestive) heart failure: Secondary | ICD-10-CM | POA: Diagnosis not present

## 2020-02-16 DIAGNOSIS — Z87891 Personal history of nicotine dependence: Secondary | ICD-10-CM | POA: Diagnosis not present

## 2020-02-16 DIAGNOSIS — I739 Peripheral vascular disease, unspecified: Secondary | ICD-10-CM | POA: Diagnosis not present

## 2020-02-16 DIAGNOSIS — I255 Ischemic cardiomyopathy: Secondary | ICD-10-CM | POA: Diagnosis not present

## 2020-02-16 DIAGNOSIS — I350 Nonrheumatic aortic (valve) stenosis: Secondary | ICD-10-CM | POA: Diagnosis not present

## 2020-02-16 DIAGNOSIS — Z952 Presence of prosthetic heart valve: Secondary | ICD-10-CM | POA: Diagnosis not present

## 2020-02-16 DIAGNOSIS — I2510 Atherosclerotic heart disease of native coronary artery without angina pectoris: Secondary | ICD-10-CM | POA: Diagnosis not present

## 2020-02-16 DIAGNOSIS — I110 Hypertensive heart disease with heart failure: Secondary | ICD-10-CM | POA: Diagnosis not present

## 2020-02-16 DIAGNOSIS — R5382 Chronic fatigue, unspecified: Secondary | ICD-10-CM | POA: Diagnosis not present

## 2020-02-21 ENCOUNTER — Other Ambulatory Visit (HOSPITAL_COMMUNITY): Payer: Self-pay | Admitting: Interventional Radiology

## 2020-02-21 DIAGNOSIS — I739 Peripheral vascular disease, unspecified: Secondary | ICD-10-CM

## 2020-02-22 DIAGNOSIS — I739 Peripheral vascular disease, unspecified: Secondary | ICD-10-CM | POA: Diagnosis not present

## 2020-02-22 DIAGNOSIS — I350 Nonrheumatic aortic (valve) stenosis: Secondary | ICD-10-CM | POA: Diagnosis not present

## 2020-02-22 DIAGNOSIS — C50411 Malignant neoplasm of upper-outer quadrant of right female breast: Secondary | ICD-10-CM | POA: Diagnosis not present

## 2020-02-22 DIAGNOSIS — I2510 Atherosclerotic heart disease of native coronary artery without angina pectoris: Secondary | ICD-10-CM | POA: Diagnosis not present

## 2020-02-22 DIAGNOSIS — Z79899 Other long term (current) drug therapy: Secondary | ICD-10-CM | POA: Diagnosis not present

## 2020-02-22 DIAGNOSIS — I272 Pulmonary hypertension, unspecified: Secondary | ICD-10-CM | POA: Diagnosis not present

## 2020-02-22 DIAGNOSIS — I255 Ischemic cardiomyopathy: Secondary | ICD-10-CM | POA: Diagnosis not present

## 2020-02-22 DIAGNOSIS — I5032 Chronic diastolic (congestive) heart failure: Secondary | ICD-10-CM | POA: Diagnosis not present

## 2020-02-22 DIAGNOSIS — Z951 Presence of aortocoronary bypass graft: Secondary | ICD-10-CM | POA: Diagnosis not present

## 2020-02-22 DIAGNOSIS — Z7982 Long term (current) use of aspirin: Secondary | ICD-10-CM | POA: Diagnosis not present

## 2020-02-22 DIAGNOSIS — R5382 Chronic fatigue, unspecified: Secondary | ICD-10-CM | POA: Diagnosis not present

## 2020-02-22 DIAGNOSIS — Z87891 Personal history of nicotine dependence: Secondary | ICD-10-CM | POA: Diagnosis not present

## 2020-02-22 DIAGNOSIS — I5023 Acute on chronic systolic (congestive) heart failure: Secondary | ICD-10-CM | POA: Diagnosis not present

## 2020-02-22 DIAGNOSIS — I110 Hypertensive heart disease with heart failure: Secondary | ICD-10-CM | POA: Diagnosis not present

## 2020-02-22 DIAGNOSIS — E43 Unspecified severe protein-calorie malnutrition: Secondary | ICD-10-CM | POA: Diagnosis not present

## 2020-02-22 DIAGNOSIS — M1991 Primary osteoarthritis, unspecified site: Secondary | ICD-10-CM | POA: Diagnosis not present

## 2020-02-22 DIAGNOSIS — Z952 Presence of prosthetic heart valve: Secondary | ICD-10-CM | POA: Diagnosis not present

## 2020-02-22 DIAGNOSIS — Z7902 Long term (current) use of antithrombotics/antiplatelets: Secondary | ICD-10-CM | POA: Diagnosis not present

## 2020-02-22 DIAGNOSIS — E785 Hyperlipidemia, unspecified: Secondary | ICD-10-CM | POA: Diagnosis not present

## 2020-02-23 ENCOUNTER — Other Ambulatory Visit (HOSPITAL_COMMUNITY): Payer: Self-pay | Admitting: Student

## 2020-02-23 ENCOUNTER — Ambulatory Visit: Payer: Medicare Other | Admitting: Cardiology

## 2020-02-23 ENCOUNTER — Other Ambulatory Visit: Payer: Self-pay | Admitting: Student

## 2020-02-23 DIAGNOSIS — I63411 Cerebral infarction due to embolism of right middle cerebral artery: Secondary | ICD-10-CM

## 2020-02-23 MED ORDER — PREDNISONE 50 MG PO TABS: ORAL_TABLET | ORAL | 0 refills | Status: DC

## 2020-02-26 ENCOUNTER — Other Ambulatory Visit: Payer: Self-pay | Admitting: Radiology

## 2020-02-27 ENCOUNTER — Other Ambulatory Visit: Payer: Self-pay | Admitting: Radiology

## 2020-02-27 DIAGNOSIS — I2510 Atherosclerotic heart disease of native coronary artery without angina pectoris: Secondary | ICD-10-CM

## 2020-02-28 ENCOUNTER — Other Ambulatory Visit: Payer: Self-pay | Admitting: Podiatry

## 2020-02-28 ENCOUNTER — Ambulatory Visit (HOSPITAL_COMMUNITY): Admission: RE | Admit: 2020-02-28 | Payer: Medicare Other | Source: Ambulatory Visit

## 2020-02-28 ENCOUNTER — Other Ambulatory Visit (HOSPITAL_COMMUNITY): Payer: Self-pay | Admitting: Interventional Radiology

## 2020-02-28 ENCOUNTER — Ambulatory Visit: Payer: Self-pay | Admitting: Podiatry

## 2020-02-28 ENCOUNTER — Other Ambulatory Visit: Payer: Self-pay

## 2020-02-28 ENCOUNTER — Ambulatory Visit (HOSPITAL_COMMUNITY)
Admission: RE | Admit: 2020-02-28 | Discharge: 2020-02-28 | Disposition: A | Discharge status: Home / Self Care | Payer: Medicare Other | Source: Ambulatory Visit | Attending: Interventional Radiology | Admitting: Interventional Radiology

## 2020-02-28 DIAGNOSIS — L97519 Non-pressure chronic ulcer of other part of right foot with unspecified severity: Secondary | ICD-10-CM | POA: Diagnosis not present

## 2020-02-28 DIAGNOSIS — I739 Peripheral vascular disease, unspecified: Secondary | ICD-10-CM

## 2020-02-28 DIAGNOSIS — Z87891 Personal history of nicotine dependence: Secondary | ICD-10-CM | POA: Insufficient documentation

## 2020-02-28 DIAGNOSIS — Z79890 Hormone replacement therapy (postmenopausal): Secondary | ICD-10-CM | POA: Insufficient documentation

## 2020-02-28 DIAGNOSIS — I70223 Atherosclerosis of native arteries of extremities with rest pain, bilateral legs: Secondary | ICD-10-CM | POA: Diagnosis not present

## 2020-02-28 DIAGNOSIS — Z91041 Radiographic dye allergy status: Secondary | ICD-10-CM | POA: Insufficient documentation

## 2020-02-28 DIAGNOSIS — Z7982 Long term (current) use of aspirin: Secondary | ICD-10-CM | POA: Diagnosis not present

## 2020-02-28 DIAGNOSIS — I7092 Chronic total occlusion of artery of the extremities: Secondary | ICD-10-CM | POA: Diagnosis not present

## 2020-02-28 DIAGNOSIS — Z79899 Other long term (current) drug therapy: Secondary | ICD-10-CM | POA: Insufficient documentation

## 2020-02-28 DIAGNOSIS — I70235 Atherosclerosis of native arteries of right leg with ulceration of other part of foot: Secondary | ICD-10-CM | POA: Diagnosis not present

## 2020-02-28 DIAGNOSIS — Z7902 Long term (current) use of antithrombotics/antiplatelets: Secondary | ICD-10-CM | POA: Diagnosis not present

## 2020-02-28 HISTORY — PX: IR ILIAC ART STENT INC PTA MOD SED: IMG2306

## 2020-02-28 HISTORY — PX: IR FEM POP INC ATHEREC / STENT / PTA MOD SED: IMG2312

## 2020-02-28 HISTORY — PX: IR FEM POP ART ATHERECT INC PTA MOD SED: IMG2310

## 2020-02-28 HISTORY — PX: IR US GUIDE VASC ACCESS LEFT: IMG2389

## 2020-02-28 LAB — CBC WITH DIFFERENTIAL/PLATELET
Abs Immature Granulocytes: 0.05 10*3/uL (ref 0.00–0.07)
Basophils Absolute: 0 10*3/uL (ref 0.0–0.1)
Basophils Relative: 0 %
Eosinophils Absolute: 0 10*3/uL (ref 0.0–0.5)
Eosinophils Relative: 0 %
HCT: 35.6 % — ABNORMAL LOW (ref 36.0–46.0)
Hemoglobin: 11 g/dL — ABNORMAL LOW (ref 12.0–15.0)
Immature Granulocytes: 1 %
Lymphocytes Relative: 5 %
Lymphs Abs: 0.5 10*3/uL — ABNORMAL LOW (ref 0.7–4.0)
MCH: 28.6 pg (ref 26.0–34.0)
MCHC: 30.9 g/dL (ref 30.0–36.0)
MCV: 92.5 fL (ref 80.0–100.0)
Monocytes Absolute: 0.2 10*3/uL (ref 0.1–1.0)
Monocytes Relative: 2 %
Neutro Abs: 9.3 10*3/uL — ABNORMAL HIGH (ref 1.7–7.7)
Neutrophils Relative %: 92 %
Platelets: 322 10*3/uL (ref 150–400)
RBC: 3.85 MIL/uL — ABNORMAL LOW (ref 3.87–5.11)
RDW: 14.6 % (ref 11.5–15.5)
WBC: 10.1 10*3/uL (ref 4.0–10.5)
nRBC: 0 % (ref 0.0–0.2)

## 2020-02-28 LAB — BASIC METABOLIC PANEL
Anion gap: 11 (ref 5–15)
BUN: 29 mg/dL — ABNORMAL HIGH (ref 8–23)
CO2: 27 mmol/L (ref 22–32)
Calcium: 9.4 mg/dL (ref 8.9–10.3)
Chloride: 102 mmol/L (ref 98–111)
Creatinine, Ser: 1.18 mg/dL — ABNORMAL HIGH (ref 0.44–1.00)
GFR, Estimated: 44 mL/min — ABNORMAL LOW (ref >60)
Glucose, Bld: 162 mg/dL — ABNORMAL HIGH (ref 70–99)
Potassium: 4.8 mmol/L (ref 3.5–5.1)
Sodium: 140 mmol/L (ref 135–145)

## 2020-02-28 LAB — PROTIME-INR
INR: 1.1 (ref 0.8–1.2)
Prothrombin Time: 14 seconds (ref 11.4–15.2)

## 2020-02-28 MED ORDER — HEPARIN SODIUM (PORCINE) 1000 UNIT/ML IJ SOLN
INTRAMUSCULAR | Status: AC
  Filled 2020-02-28: qty 1

## 2020-02-28 MED ORDER — CLINDAMYCIN HCL 150 MG PO CAPS: 150.0000 mg | ORAL_CAPSULE | Freq: Two times a day (BID) | ORAL | 0 refills | Status: DC

## 2020-02-28 MED ORDER — MIDAZOLAM HCL 2 MG/2ML IJ SOLN
INTRAMUSCULAR | Status: AC
  Filled 2020-02-28: qty 4

## 2020-02-28 MED ORDER — LIDOCAINE HCL 1 % IJ SOLN
INTRAMUSCULAR | Status: AC
  Filled 2020-02-28: qty 20

## 2020-02-28 MED ORDER — SODIUM CHLORIDE 0.9 % IV SOLN: INTRAVENOUS | Status: DC

## 2020-02-28 MED ORDER — LIDOCAINE HCL 1 % IJ SOLN
INTRAMUSCULAR | Status: AC | PRN
  Administered 2020-02-28: 20 mL via INTRADERMAL

## 2020-02-28 MED ORDER — FENTANYL CITRATE (PF) 100 MCG/2ML IJ SOLN
INTRAMUSCULAR | Status: AC | PRN
  Administered 2020-02-28: 50 ug via INTRAVENOUS
  Administered 2020-02-28 (×2): 25 ug via INTRAVENOUS

## 2020-02-28 MED ORDER — IODIXANOL 320 MG/ML IV SOLN
100.0000 mL | Freq: Once | INTRAVENOUS | Status: AC | PRN
  Administered 2020-02-28: 45 mL via INTRAVENOUS

## 2020-02-28 MED ORDER — HEPARIN SODIUM (PORCINE) 1000 UNIT/ML IJ SOLN
INTRAMUSCULAR | Status: AC | PRN
  Administered 2020-02-28: 3000 [IU] via INTRAVENOUS

## 2020-02-28 MED ORDER — FENTANYL CITRATE (PF) 100 MCG/2ML IJ SOLN
INTRAMUSCULAR | Status: AC
  Filled 2020-02-28: qty 4

## 2020-02-28 MED ORDER — IODIXANOL 320 MG/ML IV SOLN
100.0000 mL | Freq: Once | INTRAVENOUS | Status: AC | PRN
  Administered 2020-02-28: 30 mL via INTRAVENOUS

## 2020-02-28 MED ORDER — CIPROFLOXACIN HCL 250 MG PO TABS: 250.0000 mg | ORAL_TABLET | Freq: Two times a day (BID) | ORAL | 0 refills | Status: DC

## 2020-02-28 MED ORDER — IODIXANOL 320 MG/ML IV SOLN
100.0000 mL | Freq: Once | INTRAVENOUS | Status: AC | PRN
  Administered 2020-02-28: 80 mL via INTRAVENOUS

## 2020-02-28 MED ORDER — MIDAZOLAM HCL 2 MG/2ML IJ SOLN
INTRAMUSCULAR | Status: AC | PRN
  Administered 2020-02-28: 0.5 mg via INTRAVENOUS
  Administered 2020-02-28: 1 mg via INTRAVENOUS
  Administered 2020-02-28: 0.5 mg via INTRAVENOUS

## 2020-02-28 NOTE — Procedures (Signed)
Interventional Radiology Procedure Note  Procedure:  1.) Laser atherectomy and DCB PTA left CFA 2.) Laser atherectomy, PTA and stent placement right external iliac artery 3.) Laser atherectomy and DCB right CFA  Complications: None  Estimated Blood Loss: 200 mL  Recommendations: - Bedrest x 2 hrs - DC home - Podiatry called and foot wound discussed.  They are issuing abx and will see her in the office tomorrow   Signed,  Criselda Peaches, MD

## 2020-02-28 NOTE — Sedation Documentation (Signed)
7 french celt closure device deployed by Dr. Laurence Ferrari

## 2020-02-28 NOTE — Progress Notes (Signed)
Patient and daughter was given discharge instructions. Both verbalized understanding. 

## 2020-02-28 NOTE — Discharge Instructions (Signed)
Femoral Site Care  This sheet gives you information about how to care for yourself after your procedure. Your health care provider may also give you more specific instructions. If you have problems or questions, contact your health care provider. What can I expect after the procedure? After the procedure, it is common to have:  Bruising that usually fades within 1-2 weeks.  Tenderness at the site. Follow these instructions at home: Wound care  Follow instructions from your health care provider about how to take care of your insertion site. Make sure you: ? Wash your hands with soap and water before you change your bandage (dressing). If soap and water are not available, use hand sanitizer. ? Change your dressing as told by your health care provider. ? Leave stitches (sutures), skin glue, or adhesive strips in place. These skin closures may need to stay in place for 2 weeks or longer. If adhesive strip edges start to loosen and curl up, you may trim the loose edges. Do not remove adhesive strips completely unless your health care provider tells you to do that.  Do not take baths, swim, or use a hot tub until your health care provider approves.  You may shower 24-48 hours after the procedure or as told by your health care provider. ? Gently wash the site with plain soap and water. ? Pat the area dry with a clean towel. ? Do not rub the site. This may cause bleeding.  Do not apply powder or lotion to the site. Keep the site clean and dry.  Check your femoral site every day for signs of infection. Check for: ? Redness, swelling, or pain. ? Fluid or blood. ? Warmth. ? Pus or a bad smell. Activity  For the first 2-3 days after your procedure, or as long as directed: ? Avoid climbing stairs as much as possible. ? Do not squat.  Do not lift anything that is heavier than 10 lb (4.5 kg), or the limit that you are told, until your health care provider says that it is safe.  Rest as  directed. ? Avoid sitting for a long time without moving. Get up to take short walks every 1-2 hours.  Do not drive for 24 hours if you were given a medicine to help you relax (sedative). General instructions  Take over-the-counter and prescription medicines only as told by your health care provider.  Keep all follow-up visits as told by your health care provider. This is important. Contact a health care provider if you have:  A fever or chills.  You have redness, swelling, or pain around your insertion site. Get help right away if:  The catheter insertion area swells very fast.  You pass out.  You suddenly start to sweat or your skin gets clammy.  The catheter insertion area is bleeding, and the bleeding does not stop when you hold steady pressure on the area.  The area near or just beyond the catheter insertion site becomes pale, cool, tingly, or numb. These symptoms may represent a serious problem that is an emergency. Do not wait to see if the symptoms will go away. Get medical help right away. Call your local emergency services (911 in the [place] Do not drive yourself to the hospital. Summary  After the procedure, it is common to have bruising that usually fades within 1-2 weeks.  Check your femoral site every day for signs of infection.  Do not lift anything that is heavier than 10 lb (4.5 kg), or   the limit that you are told, until your health care provider says that it is safe. This information is not intended to replace advice given to you by your health care provider. Make sure you discuss any questions you have with your health care provider. Document Revised: 09/22/2019 Document Reviewed: 09/22/2019 Elsevier Patient Education  2021 [employer]  

## 2020-02-28 NOTE — Consult Note (Addendum)
Chief Complaint: Patient was seen in consultation today for abdominal aortogram with bilateral lower extremity arteriogram/endovascular intervention  Referring Physician(s):Schultz,D   Supervising Physician: Jacqulynn Cadet  Patient Status: [facility] - Out-pt  History of Present Illness: Olivia [name] is an 87 y.o. female with significant past medical history including coronary artery disease with prior CABG, hyperlipidemia, hypertension, right breast cancer, prior CVA in 2018, ischemic cardiomyopathy, CHF, nephrolithiasis, duodenal ulcer, hypothyroidism, anxiety, arthritis, mitral valve repair, atrial fibrillation and peripheral vascular disease. She is currently on aspirin and Plavix.  She was evaluated recently by Dr. Laurence Ferrari for severe bilateral peripheral arterial disease reflecting inflow, outflow and runoff distributions with right greater than left lower extremity claudication.  She has significant erythema distal aspect of right foot along with some ischemic changes.  He presents today for bilateral lower extremity arteriogram with possible laser atherectomy, balloon angioplasty and self-expanding stent placement.  She is allergic to IV contrast dye and has been premedicated. Additional hx as below.   Past Medical History:  Diagnosis Date  . Age-related osteoporosis without current pathological fracture 10/29/2019  . Atherosclerotic heart disease of native coronary artery without angina pectoris 10/02/2014  . Breast cancer ([place])   . CAD (coronary artery disease) hx of cabg  . CHF (congestive heart failure), NYHA class III, acute, systolic ([place]) 9/93/7169  . Chronic systolic heart failure ([place])   . Drug-induced skin rash 03/28/2016  . Dysphagia as late effect of cerebrovascular accident (CVA) 04/02/2016  . Dyspnea   . Elevated troponin   . Embolic stroke involving right middle cerebral artery ([place]) 03/25/2016  . Essential (primary) hypertension 10/02/2014  . History of mitral  valve repair 2008  . Hx of CABG 2008  . Hyperlipidemia with target LDL less than 70   . Hypertension   . Ischemic dilated cardiomyopathy ([place]) 03/15/2017  . Long-term use of aspirin therapy 07/20/2017  . Malignant neoplasm of upper-outer quadrant of right female breast ([place])   . Mixed hyperlipidemia 05/28/2017  . Non-rheumatic mitral regurgitation 03/15/2017  . Nonrheumatic aortic valve insufficiency 03/15/2017  . Old MI (myocardial infarction) 05/28/2017  . Other specified postprocedural states 10/02/2014  . Palpitations 05/28/2017  . Protein-calorie malnutrition, severe 11/23/2019  . PVD (peripheral vascular disease) ([place])   . S/P CABG (coronary artery bypass graft) 03/15/2017  . Senile osteoporosis   . Statin intolerance 03/15/2017    Past Surgical History:  Procedure Laterality Date  . BREAST LUMPECTOMY    . MITRAL VALVE REPAIR (MV)/CORONARY ARTERY BYPASS GRAFTING (CABG)  2018  . RIGHT/LEFT HEART CATH AND CORONARY/GRAFT ANGIOGRAPHY N/A 11/17/2019   Procedure: RIGHT/LEFT HEART CATH AND CORONARY/GRAFT ANGIOGRAPHY;  Surgeon: Jettie Booze, MD;  Location: [facility];  Service: Cardiovascular;  Laterality: N/A;    Allergies: Iodinated diagnostic agents, Ticagrelor, and Statins  Medications: Prior to Admission medications   Medication Sig Start Date End Date Taking? Authorizing Provider  amoxicillin-clavulanate (AUGMENTIN) 875-125 MG tablet Take 1 tablet by mouth 2 (two) times daily. 02/13/20  Yes Landis Martins, DPM  aspirin EC 81 MG EC tablet Take 1 tablet (81 mg total) by mouth daily. Swallow whole. 11/25/19  Yes Reino Bellis B, NP  clopidogrel (PLAVIX) 75 MG tablet Take 75 mg by mouth daily. 06/25/19  Yes [provider]  furosemide (LASIX) 20 MG tablet Take 1 tablet by mouth once daily Patient taking differently: Take 20 mg by mouth daily. 02/01/20  Yes Cheryln Manly, NP  levothyroxine (SYNTHROID) 50 MCG tablet Take 50 mcg by  mouth daily before  breakfast. 09/17/19  Yes [provider]  MAGNESIUM CITRATE PO Take 400 mg by mouth in the morning, at noon, and at bedtime.   Yes [provider]  metoprolol tartrate (LOPRESSOR) 25 MG tablet Take 25 mg by mouth 2 (two) times daily. Pt takes a total of 75 mg daily   Yes [provider]  metoprolol tartrate (LOPRESSOR) 50 MG tablet Take 50 mg by mouth 2 (two) times daily. Pt takes a total of 75 mg daily   Yes [provider]  ondansetron (ZOFRAN ODT) 4 MG disintegrating tablet Take 1 tablet (4 mg total) by mouth every 8 (eight) hours as needed for nausea or vomiting. 12/06/19  Yes Deno Etienne, DO  pantoprazole (PROTONIX) 40 MG tablet Take 40 mg by mouth daily. 09/17/19  Yes [provider]  predniSONE (DELTASONE) 50 MG tablet Take 1 tablet 13 hrs, 7hrs, and 1 hr prior to procedure scheduled for 1:00PM 02/28/20. 02/23/20  Yes Docia Barrier, PA  promethazine (PHENERGAN) 25 MG tablet Take 1 tablet (25 mg total) by mouth every 6 (six) hours as needed for nausea or vomiting. 12/06/19  Yes Deno Etienne, DO  Red Yeast Rice 600 MG CAPS Take 600 mg by mouth in the morning and at bedtime.   Yes [provider]  spironolactone (ALDACTONE) 25 MG tablet Take 0.5 tablets (12.5 mg total) by mouth daily. 11/25/19  Yes Cheryln Manly, NP  traMADol (ULTRAM) 50 MG tablet Take 50 mg by mouth every 6 (six) hours as needed for severe pain. 11/28/19  Yes [provider]  albuterol (VENTOLIN HFA) 108 (90 Base) MCG/ACT inhaler Inhale 1 puff into the lungs every 6 (six) hours as needed for shortness of breath. 04/14/16 04/24/20  [provider]  ciclopirox (LOPROX) 0.77 % cream Apply 1 application topically every other day. Apply to foot    [provider]  clotrimazole (LOTRIMIN) 1 % external solution Apply 1 application topically daily. In between toes and to toenails Patient taking differently: Apply 1 application topically every other day.  In between toes and to toenails 01/23/20   Landis Martins, DPM  nitroGLYCERIN (NITROSTAT) 0.4 MG SL tablet Place 0.4 mg under the tongue every 5 (five) minutes as needed for chest pain. 10/26/19 10/25/20  [provider]     Family History  Problem Relation Age of Onset  . Hypertension Father     Social History   Socioeconomic History  . Marital status: Widowed    Spouse name: Not on file  . Number of children: Not on file  . Years of education: Not on file  . Highest education level: Not on file  Occupational History  . Not on file  Tobacco Use  . Smoking status: Former Smoker    Quit date: 2008    Years since quitting: 14.0  . Smokeless tobacco: Never Used  Substance and Sexual Activity  . Alcohol use: Not Currently  . Drug use: Never  . Sexual activity: Not on file  Other Topics Concern  . Not on file  Social History Narrative  . Not on file   Social Determinants of Health   Financial Resource Strain: Not on file  Food Insecurity: Not on file  Transportation Needs: Not on file  Physical Activity: Not on file  Stress: Not on file  Social Connections: Not on file     Review of Systems currently denies fever, headache, chest pain, dyspnea, cough, abdominal pain, back pain, nausea,  vomiting or bleeding.  She is hard of hearing  Vital Signs: BP (!) 152/63 (BP Location: Left Arm)   Pulse 88   Temp 98.5 F (36.9 C) (Oral)   Resp (!) 21   Ht 5\' 4"  (1.626 m)   Wt 125 lb (56.7 kg)   SpO2 96%   BMI 21.46 kg/m   Physical Exam awake, extremely hard of hearing.  Answers simple questions okay.  Chest with slightly diminished breath sounds right base, left clear.  Heart with regular rate and rhythm.  Abdomen soft, positive bowel sounds, nontender.  No significant lower extremity edema but ischemic changes noted especially on right foot with associated erythema of the distal right foot. Distal pulses +/- bilat  Imaging: No results found.  Labs:  CBC: Recent  Labs    11/20/19 1108 12/06/19 0412 01/02/20 1522 02/28/20 1054  WBC 12.6* 9.8 9.5 10.1  HGB 11.2* 10.4* 10.5* 11.0*  HCT 35.0* 33.9* 32.5* 35.6*  PLT 270 451* 325 322    COAGS: No results for input(s): INR, APTT in the last 8760 hours.  BMP: Recent Labs    11/28/19 1021 12/06/19 0412 01/02/20 1522 02/28/20 1054  NA 140 138 142 140  K 5.3* 4.6 5.4* 4.8  CL 96 103 99 102  CO2 28 25 29 27   GLUCOSE 94 118* 93 162*  BUN 36* 21 40* 29*  CALCIUM 10.6* 9.4 10.2 9.4  CREATININE 1.42* 1.03* 1.36* 1.18*  GFRNONAA 33* 52* 35* 44*  GFRAA 38*  --  40*  --     LIVER FUNCTION TESTS: Recent Labs    11/10/19 0000 12/06/19 0412  BILITOT  --  0.6  AST 36* 19  ALT 12 16  ALKPHOS 52 45  PROT  --  6.3*  ALBUMIN 4.3 2.9*    TUMOR MARKERS: No results for input(s): AFPTM, CEA, CA199, CHROMGRNA in the last 8760 hours.  Assessment and Plan: 87 y.o. female with significant past medical history including coronary artery disease with prior CABG, hyperlipidemia, hypertension, right breast cancer, prior CVA in 2018, ischemic cardiomyopathy, CHF, nephrolithiasis, duodenal ulcer, hypothyroidism, anxiety, arthritis, mitral valve repair, atrial fibrillation and peripheral vascular disease. She is currently on aspirin and Plavix.  She was evaluated recently by Dr. Laurence Ferrari for severe bilateral peripheral arterial disease reflecting inflow, outflow and runoff distributions with right greater than left lower extremity claudication.  She has significant erythema distal aspect of right foot along with some ischemic changes.  He presents today for bilateral lower extremity arteriogram with possible laser atherectomy, balloon angioplasty and self-expanding stent placement.  She is allergic to IV contrast dye and has been premedicated.Risks and benefits of procedure were discussed with the patient/daughter Olivia [name] including, but not limited to bleeding, infection, vascular injury or contrast induced  renal failure.  This interventional procedure involves the use of X-rays and because of the nature of the planned procedure, it is possible that we will have prolonged use of X-ray fluoroscopy.  Potential radiation risks to you include (but are not limited to) the following: - A slightly elevated risk for cancer  several years later in life. This risk is typically less than 0.5% percent. This risk is low in comparison to the normal incidence of human cancer, which is 33% for women and 50% for men according to the [place]. - Radiation induced injury can include skin redness, resembling a rash, tissue breakdown / ulcers and hair loss (which can be temporary or permanent).   The likelihood  of either of these occurring depends on the difficulty of the procedure and whether you are sensitive to radiation due to previous procedures, disease, or genetic conditions.   IF your procedure requires a prolonged use of radiation, you will be notified and given written instructions for further action.  It is your responsibility to monitor the irradiated area for the 2 weeks following the procedure and to notify your physician if you are concerned that you have suffered a radiation induced injury.    All of the patient's questions were answered, patient is agreeable to proceed.  Consent signed and in chart.  Creat today 1.18    Thank you for this interesting consult.  I greatly enjoyed meeting Olivia [name] and look forward to participating in their care.  A copy of this report was sent to the requesting provider on this date.  Electronically Signed: D. Rowe Robert, PA-C 02/28/2020, 11:41 AM   I spent a total of  30 minutes   in face to face in clinical consultation, greater than 50% of which was counseling/coordinating care for abdominal aortogram/bilateral lower extremity arteriogram with possible endovascular intervention

## 2020-02-28 NOTE — Progress Notes (Signed)
Spoke with Dr. Lewayne Bunting of IR - patient underwent angio today. He was concerned about her foot with cellulitis and blistering. Will eval patient tomorrow in the [facility] office. Rx sent for broad spectrum abx for cellulitis until she can be seen.

## 2020-02-28 NOTE — Progress Notes (Signed)
Pt's daughter notified/reb 

## 2020-02-29 ENCOUNTER — Ambulatory Visit: Payer: Medicare Other | Admitting: Podiatry

## 2020-02-29 ENCOUNTER — Other Ambulatory Visit: Payer: Medicare Other

## 2020-02-29 DIAGNOSIS — M1991 Primary osteoarthritis, unspecified site: Secondary | ICD-10-CM | POA: Diagnosis not present

## 2020-02-29 DIAGNOSIS — I2510 Atherosclerotic heart disease of native coronary artery without angina pectoris: Secondary | ICD-10-CM | POA: Diagnosis not present

## 2020-02-29 DIAGNOSIS — C50411 Malignant neoplasm of upper-outer quadrant of right female breast: Secondary | ICD-10-CM | POA: Diagnosis not present

## 2020-02-29 DIAGNOSIS — I350 Nonrheumatic aortic (valve) stenosis: Secondary | ICD-10-CM | POA: Diagnosis not present

## 2020-02-29 DIAGNOSIS — Z87891 Personal history of nicotine dependence: Secondary | ICD-10-CM | POA: Diagnosis not present

## 2020-02-29 DIAGNOSIS — I272 Pulmonary hypertension, unspecified: Secondary | ICD-10-CM | POA: Diagnosis not present

## 2020-02-29 DIAGNOSIS — E43 Unspecified severe protein-calorie malnutrition: Secondary | ICD-10-CM | POA: Diagnosis not present

## 2020-02-29 DIAGNOSIS — I5023 Acute on chronic systolic (congestive) heart failure: Secondary | ICD-10-CM | POA: Diagnosis not present

## 2020-02-29 DIAGNOSIS — E785 Hyperlipidemia, unspecified: Secondary | ICD-10-CM | POA: Diagnosis not present

## 2020-02-29 DIAGNOSIS — Z7982 Long term (current) use of aspirin: Secondary | ICD-10-CM | POA: Diagnosis not present

## 2020-02-29 DIAGNOSIS — I255 Ischemic cardiomyopathy: Secondary | ICD-10-CM | POA: Diagnosis not present

## 2020-02-29 DIAGNOSIS — I739 Peripheral vascular disease, unspecified: Secondary | ICD-10-CM | POA: Diagnosis not present

## 2020-02-29 DIAGNOSIS — Z79899 Other long term (current) drug therapy: Secondary | ICD-10-CM | POA: Diagnosis not present

## 2020-02-29 DIAGNOSIS — Z951 Presence of aortocoronary bypass graft: Secondary | ICD-10-CM | POA: Diagnosis not present

## 2020-02-29 DIAGNOSIS — R5382 Chronic fatigue, unspecified: Secondary | ICD-10-CM | POA: Diagnosis not present

## 2020-02-29 DIAGNOSIS — Z952 Presence of prosthetic heart valve: Secondary | ICD-10-CM | POA: Diagnosis not present

## 2020-02-29 DIAGNOSIS — Z7902 Long term (current) use of antithrombotics/antiplatelets: Secondary | ICD-10-CM | POA: Diagnosis not present

## 2020-02-29 DIAGNOSIS — I110 Hypertensive heart disease with heart failure: Secondary | ICD-10-CM | POA: Diagnosis not present

## 2020-02-29 DIAGNOSIS — I5032 Chronic diastolic (congestive) heart failure: Secondary | ICD-10-CM | POA: Diagnosis not present

## 2020-03-01 ENCOUNTER — Ambulatory Visit: Payer: Medicare Other | Admitting: Sports Medicine

## 2020-03-01 ENCOUNTER — Other Ambulatory Visit: Payer: Self-pay

## 2020-03-01 DIAGNOSIS — I739 Peripheral vascular disease, unspecified: Secondary | ICD-10-CM | POA: Diagnosis not present

## 2020-03-01 DIAGNOSIS — M79674 Pain in right toe(s): Secondary | ICD-10-CM

## 2020-03-01 DIAGNOSIS — L84 Corns and callosities: Secondary | ICD-10-CM

## 2020-03-01 DIAGNOSIS — M79675 Pain in left toe(s): Secondary | ICD-10-CM | POA: Diagnosis not present

## 2020-03-01 DIAGNOSIS — R234 Changes in skin texture: Secondary | ICD-10-CM | POA: Diagnosis not present

## 2020-03-01 NOTE — Progress Notes (Signed)
Subjective: Olivia [name] is a 87 y.o. female patient who returns to the office for follow-up evaluation cellulitis or redness to the foot and a new eschar at the great toe on the left foot.  Had her vascular intervention on Wednesday and reports that the color and swelling looks better since surgery and the pain feels better but there is some occasional pain worse with shoes at the tip of the left great toe patient was prescribed clindamycin and Cipro and has 5 more days.  Denies nausea vomiting fever or chills but his son is concerned about the hallux.   Patient Active Problem List   Diagnosis Date Noted  . Breast cancer ([place])   . CAD (coronary artery disease)   . Chronic systolic heart failure ([place])   . Hyperlipidemia with target LDL less than 70   . Hypertension   . Malignant neoplasm of upper-outer quadrant of right female breast ([place])   . PVD (peripheral vascular disease) ([place])   . Senile osteoporosis   . Dyspnea   . Protein-calorie malnutrition, severe 11/23/2019  . Elevated troponin   . Age-related osteoporosis without current pathological fracture 10/29/2019  . Long-term use of aspirin therapy 07/20/2017  . Mixed hyperlipidemia 05/28/2017  . Old MI (myocardial infarction) 05/28/2017  . Palpitations 05/28/2017  . CHF (congestive heart failure), NYHA class III, acute, systolic ([place]) 78/29/5621  . Ischemic dilated cardiomyopathy ([place]) 03/15/2017  . Nonrheumatic aortic valve insufficiency 03/15/2017  . Non-rheumatic mitral regurgitation 03/15/2017  . S/P CABG (coronary artery bypass graft) 03/15/2017  . Statin intolerance 03/15/2017  . Dysphagia as late effect of cerebrovascular accident (CVA) 04/02/2016  . Drug-induced skin rash 03/28/2016  . Embolic stroke involving right middle cerebral artery ([place]) 03/25/2016  . Atherosclerotic heart disease of native coronary artery without angina pectoris 10/02/2014  . Essential (primary) hypertension 10/02/2014  . Other specified  postprocedural states 10/02/2014  . History of mitral valve repair 2008  . Hx of CABG 2008    Current Outpatient Medications on File Prior to Visit  Medication Sig Dispense Refill  . albuterol (VENTOLIN HFA) 108 (90 Base) MCG/ACT inhaler Inhale 1 puff into the lungs every 6 (six) hours as needed for shortness of breath.    Marland Kitchen aspirin EC 81 MG EC tablet Take 1 tablet (81 mg total) by mouth daily. Swallow whole. 30 tablet 11  . ciclopirox (LOPROX) 0.77 % cream Apply 1 application topically every other day. Apply to foot    . ciprofloxacin (CIPRO) 250 MG tablet Take 1 tablet (250 mg total) by mouth 2 (two) times daily. 14 tablet 0  . clindamycin (CLEOCIN) 150 MG capsule Take 1 capsule (150 mg total) by mouth 2 (two) times daily. 14 capsule 0  . clopidogrel (PLAVIX) 75 MG tablet Take 75 mg by mouth daily.    . clotrimazole (LOTRIMIN) 1 % external solution Apply 1 application topically daily. In between toes and to toenails (Patient taking differently: Apply 1 application topically every other day. In between toes and to toenails) 60 mL 5  . furosemide (LASIX) 20 MG tablet Take 1 tablet by mouth once daily (Patient taking differently: Take 20 mg by mouth daily.) 90 tablet 3  . levothyroxine (SYNTHROID) 50 MCG tablet Take 50 mcg by mouth daily before breakfast.    . MAGNESIUM CITRATE PO Take 400 mg by mouth in the morning, at noon, and at bedtime.    . metoprolol tartrate (LOPRESSOR) 25 MG tablet Take 25 mg by mouth 2 (two) times daily.  Pt takes a total of 75 mg daily    . metoprolol tartrate (LOPRESSOR) 50 MG tablet Take 50 mg by mouth 2 (two) times daily. Pt takes a total of 75 mg daily    . nitroGLYCERIN (NITROSTAT) 0.4 MG SL tablet Place 0.4 mg under the tongue every 5 (five) minutes as needed for chest pain.    Marland Kitchen ondansetron (ZOFRAN ODT) 4 MG disintegrating tablet Take 1 tablet (4 mg total) by mouth every 8 (eight) hours as needed for nausea or vomiting. 20 tablet 0  . pantoprazole (PROTONIX) 40  MG tablet Take 40 mg by mouth daily.    . predniSONE (DELTASONE) 50 MG tablet Take 1 tablet 13 hrs, 7hrs, and 1 hr prior to procedure scheduled for 1:00PM 02/28/20. 3 tablet 0  . promethazine (PHENERGAN) 25 MG tablet Take 1 tablet (25 mg total) by mouth every 6 (six) hours as needed for nausea or vomiting. 30 tablet 0  . Red Yeast Rice 600 MG CAPS Take 600 mg by mouth in the morning and at bedtime.    Marland Kitchen spironolactone (ALDACTONE) 25 MG tablet Take 0.5 tablets (12.5 mg total) by mouth daily. 30 tablet 0  . traMADol (ULTRAM) 50 MG tablet Take 50 mg by mouth every 6 (six) hours as needed for severe pain.     No current facility-administered medications on file prior to visit.    Allergies  Allergen Reactions  . Iodinated Diagnostic Agents Rash  . Ticagrelor Rash  . Statins Other (See Comments)    myalgias     Objective:  General: Alert and oriented x3 in no acute distress  Dermatology: Scaly skin and maceration in between toes is slowly improving now there is a dry eschar noted to the distal tuft of the left hallux blanchable erythema to the forefoot no warmth no malodor no active drainage.  Preulcerative callus noted to left>right heel noted signs of infection.  Vascular: Dorsalis Pedis and Posterior Tibial pedal pulses nonpalpable however capillary fill time seems to be slightly improved at 3 seconds, (+) dependent rubor that improves with elevation likely redness is related to circulation and not true infection however at this time advised patient to continue with oral antibiotics, scant pedal hair growth bilateral, no significant edema bilateral lower extremities, Temperature gradient decreased bilateral.  Neurology: Gross sensation intact via light touch bilateral.  Musculoskeletal: Mild tenderness with palpation all toes bilateral.  There is significant bunion and hammertoe deformity noted.  Pes planus deformity noted.   Assessment and Plan: Problem List Items Addressed This Visit    None   Visit Diagnoses    Eschar of toe    -  Primary   PAD (peripheral artery disease) (HCC)       Toe pain, bilateral       Pre-ulcerative calluses         -Complete examination performed -Discussed treatment options eschar to toe and PAD and preulcerative nature of skin changes at heels -With clindamycin and Cipro until completed  -10 you antibiotic solution and creams and soaking and start using Betadine as directed -Continue with bedroom shoes that do not rub toes and heel offloading pad as dispensed last visit -Continue with vascular follow-up -Patient to return to office in 1 week if not better we will try patient on oral Mordecai Rasmussen, DPM

## 2020-03-04 ENCOUNTER — Ambulatory Visit: Payer: Medicare Other | Admitting: Cardiology

## 2020-03-05 ENCOUNTER — Ambulatory Visit: Payer: Medicare Other | Admitting: Sports Medicine

## 2020-03-06 ENCOUNTER — Encounter (HOSPITAL_COMMUNITY): Payer: Self-pay

## 2020-03-06 ENCOUNTER — Other Ambulatory Visit (HOSPITAL_COMMUNITY): Payer: Self-pay | Admitting: Interventional Radiology

## 2020-03-06 DIAGNOSIS — I739 Peripheral vascular disease, unspecified: Secondary | ICD-10-CM

## 2020-03-07 DIAGNOSIS — Z79899 Other long term (current) drug therapy: Secondary | ICD-10-CM | POA: Diagnosis not present

## 2020-03-07 DIAGNOSIS — I110 Hypertensive heart disease with heart failure: Secondary | ICD-10-CM | POA: Diagnosis not present

## 2020-03-07 DIAGNOSIS — M1991 Primary osteoarthritis, unspecified site: Secondary | ICD-10-CM | POA: Diagnosis not present

## 2020-03-07 DIAGNOSIS — Z7902 Long term (current) use of antithrombotics/antiplatelets: Secondary | ICD-10-CM | POA: Diagnosis not present

## 2020-03-07 DIAGNOSIS — I739 Peripheral vascular disease, unspecified: Secondary | ICD-10-CM | POA: Diagnosis not present

## 2020-03-07 DIAGNOSIS — Z7982 Long term (current) use of aspirin: Secondary | ICD-10-CM | POA: Diagnosis not present

## 2020-03-07 DIAGNOSIS — Z952 Presence of prosthetic heart valve: Secondary | ICD-10-CM | POA: Diagnosis not present

## 2020-03-07 DIAGNOSIS — I5032 Chronic diastolic (congestive) heart failure: Secondary | ICD-10-CM | POA: Diagnosis not present

## 2020-03-07 DIAGNOSIS — I272 Pulmonary hypertension, unspecified: Secondary | ICD-10-CM | POA: Diagnosis not present

## 2020-03-07 DIAGNOSIS — R5382 Chronic fatigue, unspecified: Secondary | ICD-10-CM | POA: Diagnosis not present

## 2020-03-07 DIAGNOSIS — C50411 Malignant neoplasm of upper-outer quadrant of right female breast: Secondary | ICD-10-CM | POA: Diagnosis not present

## 2020-03-07 DIAGNOSIS — I255 Ischemic cardiomyopathy: Secondary | ICD-10-CM | POA: Diagnosis not present

## 2020-03-07 DIAGNOSIS — I2510 Atherosclerotic heart disease of native coronary artery without angina pectoris: Secondary | ICD-10-CM | POA: Diagnosis not present

## 2020-03-07 DIAGNOSIS — I350 Nonrheumatic aortic (valve) stenosis: Secondary | ICD-10-CM | POA: Diagnosis not present

## 2020-03-07 DIAGNOSIS — E43 Unspecified severe protein-calorie malnutrition: Secondary | ICD-10-CM | POA: Diagnosis not present

## 2020-03-07 DIAGNOSIS — I5023 Acute on chronic systolic (congestive) heart failure: Secondary | ICD-10-CM | POA: Diagnosis not present

## 2020-03-07 DIAGNOSIS — Z87891 Personal history of nicotine dependence: Secondary | ICD-10-CM | POA: Diagnosis not present

## 2020-03-07 DIAGNOSIS — E785 Hyperlipidemia, unspecified: Secondary | ICD-10-CM | POA: Diagnosis not present

## 2020-03-07 DIAGNOSIS — Z951 Presence of aortocoronary bypass graft: Secondary | ICD-10-CM | POA: Diagnosis not present

## 2020-03-08 ENCOUNTER — Encounter: Payer: Self-pay | Admitting: Sports Medicine

## 2020-03-08 ENCOUNTER — Telehealth: Payer: Self-pay | Admitting: Sports Medicine

## 2020-03-08 ENCOUNTER — Other Ambulatory Visit: Payer: Self-pay

## 2020-03-08 ENCOUNTER — Ambulatory Visit: Payer: Medicare Other | Admitting: Sports Medicine

## 2020-03-08 DIAGNOSIS — I739 Peripheral vascular disease, unspecified: Secondary | ICD-10-CM

## 2020-03-08 DIAGNOSIS — Z9889 Other specified postprocedural states: Secondary | ICD-10-CM | POA: Diagnosis not present

## 2020-03-08 DIAGNOSIS — M79674 Pain in right toe(s): Secondary | ICD-10-CM | POA: Diagnosis not present

## 2020-03-08 DIAGNOSIS — R234 Changes in skin texture: Secondary | ICD-10-CM

## 2020-03-08 DIAGNOSIS — I70211 Atherosclerosis of native arteries of extremities with intermittent claudication, right leg: Secondary | ICD-10-CM | POA: Diagnosis not present

## 2020-03-08 DIAGNOSIS — M79675 Pain in left toe(s): Secondary | ICD-10-CM | POA: Diagnosis not present

## 2020-03-08 MED ORDER — NUZYRA 150 MG PO TABS: 2.0000 | ORAL_TABLET | Freq: Every day | ORAL | 0 refills | Status: AC

## 2020-03-08 NOTE — Telephone Encounter (Signed)
[facility] called stating the pt's insurance rejected the order. Can one of you do a prior authorization for this patient?

## 2020-03-08 NOTE — Progress Notes (Signed)
Subjective: Olivia [name] is a 87 y.o. female patient who returns to the office for follow-up evaluation cellulitis or redness to the foot and eschar to right great toe. Patient denies any issues and saw Dr. Earleen Newport vascular today who thinks the toe is getting better.  Patient is assisted by daughter who helps to report history.  Daughter states that she is also helping doing the wound care of applying Betadine and slowly looking better.  Daughter reports that her mom still has significant pain in the toes.  Denies nausea vomiting fever chills or any constitutional symptoms.  Has completed Clinda and Cipro with minimal improvement.  No other pedal complaints noted.  Patient Active Problem List   Diagnosis Date Noted  . Breast cancer ([place])   . CAD (coronary artery disease)   . Chronic systolic heart failure ([place])   . Hyperlipidemia with target LDL less than 70   . Hypertension   . Malignant neoplasm of upper-outer quadrant of right female breast ([place])   . PVD (peripheral vascular disease) ([place])   . Senile osteoporosis   . Dyspnea   . Protein-calorie malnutrition, severe 11/23/2019  . Elevated troponin   . Age-related osteoporosis without current pathological fracture 10/29/2019  . Long-term use of aspirin therapy 07/20/2017  . Mixed hyperlipidemia 05/28/2017  . Old MI (myocardial infarction) 05/28/2017  . Palpitations 05/28/2017  . CHF (congestive heart failure), NYHA class III, acute, systolic ([place]) 16/11/9602  . Ischemic dilated cardiomyopathy ([place]) 03/15/2017  . Nonrheumatic aortic valve insufficiency 03/15/2017  . Non-rheumatic mitral regurgitation 03/15/2017  . S/P CABG (coronary artery bypass graft) 03/15/2017  . Statin intolerance 03/15/2017  . Dysphagia as late effect of cerebrovascular accident (CVA) 04/02/2016  . Drug-induced skin rash 03/28/2016  . Embolic stroke involving right middle cerebral artery ([place]) 03/25/2016  . Atherosclerotic heart disease of native coronary artery  without angina pectoris 10/02/2014  . Essential (primary) hypertension 10/02/2014  . Other specified postprocedural states 10/02/2014  . History of mitral valve repair 2008  . Hx of CABG 2008    Current Outpatient Medications on File Prior to Visit  Medication Sig Dispense Refill  . albuterol (VENTOLIN HFA) 108 (90 Base) MCG/ACT inhaler Inhale 1 puff into the lungs every 6 (six) hours as needed for shortness of breath.    Marland Kitchen aspirin EC 81 MG EC tablet Take 1 tablet (81 mg total) by mouth daily. Swallow whole. 30 tablet 11  . ciclopirox (LOPROX) 0.77 % cream Apply 1 application topically every other day. Apply to foot    . ciprofloxacin (CIPRO) 250 MG tablet Take 1 tablet (250 mg total) by mouth 2 (two) times daily. 14 tablet 0  . clindamycin (CLEOCIN) 150 MG capsule Take 1 capsule (150 mg total) by mouth 2 (two) times daily. 14 capsule 0  . clopidogrel (PLAVIX) 75 MG tablet Take 75 mg by mouth daily.    . clotrimazole (LOTRIMIN) 1 % external solution Apply 1 application topically daily. In between toes and to toenails (Patient taking differently: Apply 1 application topically every other day. In between toes and to toenails) 60 mL 5  . furosemide (LASIX) 20 MG tablet Take 1 tablet by mouth once daily (Patient taking differently: Take 20 mg by mouth daily.) 90 tablet 3  . levothyroxine (SYNTHROID) 50 MCG tablet Take 50 mcg by mouth daily before breakfast.    . MAGNESIUM CITRATE PO Take 400 mg by mouth in the morning, at noon, and at bedtime.    . metoprolol tartrate (LOPRESSOR)  25 MG tablet Take 25 mg by mouth 2 (two) times daily. Pt takes a total of 75 mg daily    . metoprolol tartrate (LOPRESSOR) 50 MG tablet Take 50 mg by mouth 2 (two) times daily. Pt takes a total of 75 mg daily    . nitroGLYCERIN (NITROSTAT) 0.4 MG SL tablet Place 0.4 mg under the tongue every 5 (five) minutes as needed for chest pain.    Marland Kitchen ondansetron (ZOFRAN ODT) 4 MG disintegrating tablet Take 1 tablet (4 mg total) by  mouth every 8 (eight) hours as needed for nausea or vomiting. 20 tablet 0  . pantoprazole (PROTONIX) 40 MG tablet Take 40 mg by mouth daily.    . predniSONE (DELTASONE) 50 MG tablet Take 1 tablet 13 hrs, 7hrs, and 1 hr prior to procedure scheduled for 1:00PM 02/28/20. 3 tablet 0  . promethazine (PHENERGAN) 25 MG tablet Take 1 tablet (25 mg total) by mouth every 6 (six) hours as needed for nausea or vomiting. 30 tablet 0  . Red Yeast Rice 600 MG CAPS Take 600 mg by mouth in the morning and at bedtime.    Marland Kitchen spironolactone (ALDACTONE) 25 MG tablet Take 0.5 tablets (12.5 mg total) by mouth daily. 30 tablet 0  . traMADol (ULTRAM) 50 MG tablet Take 50 mg by mouth every 6 (six) hours as needed for severe pain.     No current facility-administered medications on file prior to visit.    Allergies  Allergen Reactions  . Iodinated Diagnostic Agents Rash  . Ticagrelor Rash  . Statins Other (See Comments)    myalgias     Objective:  General: Alert and oriented x3 in no acute distress  Dermatology: Scaly skin and maceration in between toes is slowly improving now there is a dry eschar noted to the distal tuft of the right hallux blanchable erythema to the forefoot no warmth no malodor no active drainage. Previous left foot skin changes have resolved. Preulcerative callus noted to right heel noted signs of infection.  Vascular: Dorsalis Pedis and Posterior Tibial pedal pulses nonpalpable however capillary fill time seems to be slightly improved at 3 seconds, (+) dependent rubor that improves with elevation, scant pedal hair growth bilateral, no significant edema bilateral lower extremities, Temperature gradient decreased bilateral.  Neurology: Gross sensation intact via light touch bilateral.  Musculoskeletal: Mild tenderness with palpation all toes bilateral.  There is significant bunion and hammertoe deformity noted.  Pes planus deformity noted.   Assessment and Plan: Problem List Items Addressed  This Visit   None   Visit Diagnoses    Eschar of toe    -  Primary   PAD (peripheral artery disease) (HCC)       Toe pain, bilateral         -Complete examination performed -Discussed treatment options eschar to toe and PAD and preulcerative nature of skin changes at heel on right -Cleansed wounds on right and applied betadine and clean sock and advised daughter to dress using the same once daily -Rx Nuzrya antibiotic for patient to take as instructed -Continue with bedroom shoes that do not rub toes and heel offloading pad as dispensed last visit -Continue with vascular follow-up -Patient to return to office in 1 week for re-check of toe.  Landis Martins, DPM

## 2020-03-09 DIAGNOSIS — I509 Heart failure, unspecified: Secondary | ICD-10-CM | POA: Diagnosis not present

## 2020-03-14 DIAGNOSIS — Z951 Presence of aortocoronary bypass graft: Secondary | ICD-10-CM | POA: Diagnosis not present

## 2020-03-14 DIAGNOSIS — E43 Unspecified severe protein-calorie malnutrition: Secondary | ICD-10-CM | POA: Diagnosis not present

## 2020-03-14 DIAGNOSIS — I350 Nonrheumatic aortic (valve) stenosis: Secondary | ICD-10-CM | POA: Diagnosis not present

## 2020-03-14 DIAGNOSIS — Z7982 Long term (current) use of aspirin: Secondary | ICD-10-CM | POA: Diagnosis not present

## 2020-03-14 DIAGNOSIS — I272 Pulmonary hypertension, unspecified: Secondary | ICD-10-CM | POA: Diagnosis not present

## 2020-03-14 DIAGNOSIS — Z952 Presence of prosthetic heart valve: Secondary | ICD-10-CM | POA: Diagnosis not present

## 2020-03-14 DIAGNOSIS — I739 Peripheral vascular disease, unspecified: Secondary | ICD-10-CM | POA: Diagnosis not present

## 2020-03-14 DIAGNOSIS — Z79899 Other long term (current) drug therapy: Secondary | ICD-10-CM | POA: Diagnosis not present

## 2020-03-14 DIAGNOSIS — Z7902 Long term (current) use of antithrombotics/antiplatelets: Secondary | ICD-10-CM | POA: Diagnosis not present

## 2020-03-14 DIAGNOSIS — I5023 Acute on chronic systolic (congestive) heart failure: Secondary | ICD-10-CM | POA: Diagnosis not present

## 2020-03-14 DIAGNOSIS — M1991 Primary osteoarthritis, unspecified site: Secondary | ICD-10-CM | POA: Diagnosis not present

## 2020-03-14 DIAGNOSIS — I5032 Chronic diastolic (congestive) heart failure: Secondary | ICD-10-CM | POA: Diagnosis not present

## 2020-03-14 DIAGNOSIS — Z87891 Personal history of nicotine dependence: Secondary | ICD-10-CM | POA: Diagnosis not present

## 2020-03-14 DIAGNOSIS — C50411 Malignant neoplasm of upper-outer quadrant of right female breast: Secondary | ICD-10-CM | POA: Diagnosis not present

## 2020-03-14 DIAGNOSIS — I110 Hypertensive heart disease with heart failure: Secondary | ICD-10-CM | POA: Diagnosis not present

## 2020-03-14 DIAGNOSIS — I255 Ischemic cardiomyopathy: Secondary | ICD-10-CM | POA: Diagnosis not present

## 2020-03-14 DIAGNOSIS — R5382 Chronic fatigue, unspecified: Secondary | ICD-10-CM | POA: Diagnosis not present

## 2020-03-14 DIAGNOSIS — E785 Hyperlipidemia, unspecified: Secondary | ICD-10-CM | POA: Diagnosis not present

## 2020-03-14 DIAGNOSIS — I2510 Atherosclerotic heart disease of native coronary artery without angina pectoris: Secondary | ICD-10-CM | POA: Diagnosis not present

## 2020-03-15 ENCOUNTER — Encounter: Payer: Self-pay | Admitting: Sports Medicine

## 2020-03-15 ENCOUNTER — Ambulatory Visit: Payer: Medicare Other | Admitting: Sports Medicine

## 2020-03-15 ENCOUNTER — Other Ambulatory Visit: Payer: Self-pay

## 2020-03-15 DIAGNOSIS — M79674 Pain in right toe(s): Secondary | ICD-10-CM | POA: Diagnosis not present

## 2020-03-15 DIAGNOSIS — I739 Peripheral vascular disease, unspecified: Secondary | ICD-10-CM | POA: Diagnosis not present

## 2020-03-15 DIAGNOSIS — M79675 Pain in left toe(s): Secondary | ICD-10-CM | POA: Diagnosis not present

## 2020-03-15 DIAGNOSIS — R234 Changes in skin texture: Secondary | ICD-10-CM

## 2020-03-15 NOTE — Progress Notes (Signed)
Subjective: Olivia [name] is a 87 y.o. female patient who returns to the office for follow-up evaluation cellulitis or redness to the foot and eschar to right great toe. Patient is assisted by son who reports that her toe seems to be getting better and the antibiotic is working however did have some queasiness to her stomach.  No other pedal complaints noted.  Patient Active Problem List   Diagnosis Date Noted  . Breast cancer ([place])   . CAD (coronary artery disease)   . Chronic systolic heart failure ([place])   . Hyperlipidemia with target LDL less than 70   . Hypertension   . Malignant neoplasm of upper-outer quadrant of right female breast ([place])   . PVD (peripheral vascular disease) ([place])   . Senile osteoporosis   . Dyspnea   . Protein-calorie malnutrition, severe 11/23/2019  . Elevated troponin   . Age-related osteoporosis without current pathological fracture 10/29/2019  . Long-term use of aspirin therapy 07/20/2017  . Mixed hyperlipidemia 05/28/2017  . Old MI (myocardial infarction) 05/28/2017  . Palpitations 05/28/2017  . CHF (congestive heart failure), NYHA class III, acute, systolic ([place]) 25/36/6440  . Ischemic dilated cardiomyopathy ([place]) 03/15/2017  . Nonrheumatic aortic valve insufficiency 03/15/2017  . Non-rheumatic mitral regurgitation 03/15/2017  . S/P CABG (coronary artery bypass graft) 03/15/2017  . Statin intolerance 03/15/2017  . Dysphagia as late effect of cerebrovascular accident (CVA) 04/02/2016  . Drug-induced skin rash 03/28/2016  . Embolic stroke involving right middle cerebral artery ([place]) 03/25/2016  . Atherosclerotic heart disease of native coronary artery without angina pectoris 10/02/2014  . Essential (primary) hypertension 10/02/2014  . Other specified postprocedural states 10/02/2014  . History of mitral valve repair 2008  . Hx of CABG 2008    Current Outpatient Medications on File Prior to Visit  Medication Sig Dispense Refill  . albuterol  (VENTOLIN HFA) 108 (90 Base) MCG/ACT inhaler Inhale 1 puff into the lungs every 6 (six) hours as needed for shortness of breath.    Marland Kitchen aspirin EC 81 MG EC tablet Take 1 tablet (81 mg total) by mouth daily. Swallow whole. 30 tablet 11  . ciclopirox (LOPROX) 0.77 % cream Apply 1 application topically every other day. Apply to foot    . ciprofloxacin (CIPRO) 250 MG tablet Take 1 tablet (250 mg total) by mouth 2 (two) times daily. 14 tablet 0  . clindamycin (CLEOCIN) 150 MG capsule Take 1 capsule (150 mg total) by mouth 2 (two) times daily. 14 capsule 0  . clopidogrel (PLAVIX) 75 MG tablet Take 75 mg by mouth daily.    . clotrimazole (LOTRIMIN) 1 % external solution Apply 1 application topically daily. In between toes and to toenails (Patient taking differently: Apply 1 application topically every other day. In between toes and to toenails) 60 mL 5  . furosemide (LASIX) 20 MG tablet Take 1 tablet by mouth once daily (Patient taking differently: Take 20 mg by mouth daily.) 90 tablet 3  . levothyroxine (SYNTHROID) 50 MCG tablet Take 50 mcg by mouth daily before breakfast.    . MAGNESIUM CITRATE PO Take 400 mg by mouth in the morning, at noon, and at bedtime.    . metoprolol tartrate (LOPRESSOR) 25 MG tablet Take 25 mg by mouth 2 (two) times daily. Pt takes a total of 75 mg daily    . metoprolol tartrate (LOPRESSOR) 50 MG tablet Take 50 mg by mouth 2 (two) times daily. Pt takes a total of 75 mg daily    .  nitroGLYCERIN (NITROSTAT) 0.4 MG SL tablet Place 0.4 mg under the tongue every 5 (five) minutes as needed for chest pain.    Marland Kitchen ondansetron (ZOFRAN ODT) 4 MG disintegrating tablet Take 1 tablet (4 mg total) by mouth every 8 (eight) hours as needed for nausea or vomiting. 20 tablet 0  . pantoprazole (PROTONIX) 40 MG tablet Take 40 mg by mouth daily.    . predniSONE (DELTASONE) 50 MG tablet Take 1 tablet 13 hrs, 7hrs, and 1 hr prior to procedure scheduled for 1:00PM 02/28/20. 3 tablet 0  . promethazine  (PHENERGAN) 25 MG tablet Take 1 tablet (25 mg total) by mouth every 6 (six) hours as needed for nausea or vomiting. 30 tablet 0  . Red Yeast Rice 600 MG CAPS Take 600 mg by mouth in the morning and at bedtime.    Marland Kitchen spironolactone (ALDACTONE) 25 MG tablet Take 0.5 tablets (12.5 mg total) by mouth daily. 30 tablet 0  . traMADol (ULTRAM) 50 MG tablet Take 50 mg by mouth every 6 (six) hours as needed for severe pain.     No current facility-administered medications on file prior to visit.    Allergies  Allergen Reactions  . Iodinated Diagnostic Agents Rash  . Ticagrelor Rash  . Statins Other (See Comments)    myalgias     Objective:  General: Alert and oriented x3 in no acute distress  Dermatology: Scaly skin and maceration in between toes is slowly improving now there is a dry eschar noted to the distal tuft of the right hallux blanchable erythema to the forefoot no warmth no malodor no active drainage that appears to be getting better. Previous left foot skin changes have resolved. Preulcerative callus noted to right heel noted signs of infection.  Vascular: Dorsalis Pedis and Posterior Tibial pedal pulses nonpalpable however capillary fill time seems to be slightly improved at 3 seconds, (+) dependent rubor that improves with elevation, scant pedal hair growth bilateral, no significant edema bilateral lower extremities, Temperature gradient decreased bilateral.  Neurology: Gross sensation intact via light touch bilateral.  Musculoskeletal: Mild tenderness with palpation all toes bilateral.  There is significant bunion and hammertoe deformity noted.  Pes planus deformity noted.   Assessment and Plan: Problem List Items Addressed This Visit   None   Visit Diagnoses    Eschar of toe    -  Primary   PAD (peripheral artery disease) (HCC)       Toe pain, bilateral         -Complete examination performed -Re-Discussed treatment options eschar to toe and PAD and preulcerative nature of  skin changes at heel on right -Cleansed wounds on right and applied betadine and clean sock and advised daughter to dress using the same once every other day -Continue with Nuzrya antibiotic for patient to take as instructed until completed -Recommend probe -Continue with bedroom shoes that do not rub toes and heel offloading padding -Continue with vascular follow-up -Patient to return to office in 2 weeks for re-check of toe.  Landis Martins, DPM

## 2020-03-21 ENCOUNTER — Other Ambulatory Visit: Payer: Self-pay

## 2020-03-21 ENCOUNTER — Ambulatory Visit (INDEPENDENT_AMBULATORY_CARE_PROVIDER_SITE_OTHER): Payer: Medicare Other

## 2020-03-21 DIAGNOSIS — Z87891 Personal history of nicotine dependence: Secondary | ICD-10-CM | POA: Diagnosis not present

## 2020-03-21 DIAGNOSIS — E785 Hyperlipidemia, unspecified: Secondary | ICD-10-CM | POA: Diagnosis not present

## 2020-03-21 DIAGNOSIS — I5032 Chronic diastolic (congestive) heart failure: Secondary | ICD-10-CM | POA: Diagnosis not present

## 2020-03-21 DIAGNOSIS — I420 Dilated cardiomyopathy: Secondary | ICD-10-CM

## 2020-03-21 DIAGNOSIS — I739 Peripheral vascular disease, unspecified: Secondary | ICD-10-CM | POA: Diagnosis not present

## 2020-03-21 DIAGNOSIS — Z7982 Long term (current) use of aspirin: Secondary | ICD-10-CM | POA: Diagnosis not present

## 2020-03-21 DIAGNOSIS — Z952 Presence of prosthetic heart valve: Secondary | ICD-10-CM | POA: Diagnosis not present

## 2020-03-21 DIAGNOSIS — Z7902 Long term (current) use of antithrombotics/antiplatelets: Secondary | ICD-10-CM | POA: Diagnosis not present

## 2020-03-21 DIAGNOSIS — Z79899 Other long term (current) drug therapy: Secondary | ICD-10-CM | POA: Diagnosis not present

## 2020-03-21 DIAGNOSIS — I350 Nonrheumatic aortic (valve) stenosis: Secondary | ICD-10-CM | POA: Diagnosis not present

## 2020-03-21 DIAGNOSIS — I2510 Atherosclerotic heart disease of native coronary artery without angina pectoris: Secondary | ICD-10-CM | POA: Diagnosis not present

## 2020-03-21 DIAGNOSIS — I255 Ischemic cardiomyopathy: Secondary | ICD-10-CM | POA: Diagnosis not present

## 2020-03-21 DIAGNOSIS — M1991 Primary osteoarthritis, unspecified site: Secondary | ICD-10-CM | POA: Diagnosis not present

## 2020-03-21 DIAGNOSIS — I110 Hypertensive heart disease with heart failure: Secondary | ICD-10-CM | POA: Diagnosis not present

## 2020-03-21 DIAGNOSIS — I272 Pulmonary hypertension, unspecified: Secondary | ICD-10-CM | POA: Diagnosis not present

## 2020-03-21 DIAGNOSIS — Z951 Presence of aortocoronary bypass graft: Secondary | ICD-10-CM | POA: Diagnosis not present

## 2020-03-21 DIAGNOSIS — I5023 Acute on chronic systolic (congestive) heart failure: Secondary | ICD-10-CM | POA: Diagnosis not present

## 2020-03-21 DIAGNOSIS — E43 Unspecified severe protein-calorie malnutrition: Secondary | ICD-10-CM | POA: Diagnosis not present

## 2020-03-21 DIAGNOSIS — R5382 Chronic fatigue, unspecified: Secondary | ICD-10-CM | POA: Diagnosis not present

## 2020-03-21 DIAGNOSIS — C50411 Malignant neoplasm of upper-outer quadrant of right female breast: Secondary | ICD-10-CM | POA: Diagnosis not present

## 2020-03-21 LAB — ECHOCARDIOGRAM COMPLETE
AR max vel: 1.44 cm2
AV Area VTI: 1.52 cm2
AV Area mean vel: 1.48 cm2
AV Mean grad: 12 mmHg
AV Peak grad: 20.3 mmHg
Ao pk vel: 2.25 m/s
Area-P 1/2: 2.09 cm2
Calc EF: 41.4 %
MV VTI: 1.69 cm2
P 1/2 time: 286 msec
S' Lateral: 4.7 cm
Single Plane A2C EF: 44.3 %
Single Plane A4C EF: 41.9 %

## 2020-03-21 NOTE — Progress Notes (Signed)
Complete echocardiogram performed.  Jimmy Katina Remick RDCS, RVT  

## 2020-03-27 ENCOUNTER — Other Ambulatory Visit: Payer: Self-pay

## 2020-03-28 ENCOUNTER — Other Ambulatory Visit: Payer: Self-pay

## 2020-03-28 ENCOUNTER — Ambulatory Visit: Payer: Medicare Other | Admitting: Cardiology

## 2020-03-28 ENCOUNTER — Encounter: Payer: Self-pay | Admitting: Cardiology

## 2020-03-28 VITALS — BP 110/68 | HR 74 | Ht 67.0 in | Wt 125.0 lb

## 2020-03-28 DIAGNOSIS — I10 Essential (primary) hypertension: Secondary | ICD-10-CM

## 2020-03-28 DIAGNOSIS — Z951 Presence of aortocoronary bypass graft: Secondary | ICD-10-CM | POA: Diagnosis not present

## 2020-03-28 DIAGNOSIS — I2510 Atherosclerotic heart disease of native coronary artery without angina pectoris: Secondary | ICD-10-CM | POA: Diagnosis not present

## 2020-03-28 DIAGNOSIS — I350 Nonrheumatic aortic (valve) stenosis: Secondary | ICD-10-CM | POA: Diagnosis not present

## 2020-03-28 DIAGNOSIS — I420 Dilated cardiomyopathy: Secondary | ICD-10-CM

## 2020-03-28 DIAGNOSIS — Z79899 Other long term (current) drug therapy: Secondary | ICD-10-CM | POA: Diagnosis not present

## 2020-03-28 DIAGNOSIS — E43 Unspecified severe protein-calorie malnutrition: Secondary | ICD-10-CM | POA: Diagnosis not present

## 2020-03-28 DIAGNOSIS — Z87891 Personal history of nicotine dependence: Secondary | ICD-10-CM | POA: Diagnosis not present

## 2020-03-28 DIAGNOSIS — E785 Hyperlipidemia, unspecified: Secondary | ICD-10-CM | POA: Diagnosis not present

## 2020-03-28 DIAGNOSIS — I255 Ischemic cardiomyopathy: Secondary | ICD-10-CM | POA: Diagnosis not present

## 2020-03-28 DIAGNOSIS — R5382 Chronic fatigue, unspecified: Secondary | ICD-10-CM | POA: Diagnosis not present

## 2020-03-28 DIAGNOSIS — Z9889 Other specified postprocedural states: Secondary | ICD-10-CM

## 2020-03-28 DIAGNOSIS — I739 Peripheral vascular disease, unspecified: Secondary | ICD-10-CM

## 2020-03-28 DIAGNOSIS — M1991 Primary osteoarthritis, unspecified site: Secondary | ICD-10-CM | POA: Diagnosis not present

## 2020-03-28 DIAGNOSIS — Z952 Presence of prosthetic heart valve: Secondary | ICD-10-CM | POA: Diagnosis not present

## 2020-03-28 DIAGNOSIS — I5021 Acute systolic (congestive) heart failure: Secondary | ICD-10-CM

## 2020-03-28 DIAGNOSIS — I5023 Acute on chronic systolic (congestive) heart failure: Secondary | ICD-10-CM | POA: Diagnosis not present

## 2020-03-28 DIAGNOSIS — Z7902 Long term (current) use of antithrombotics/antiplatelets: Secondary | ICD-10-CM | POA: Diagnosis not present

## 2020-03-28 DIAGNOSIS — C50411 Malignant neoplasm of upper-outer quadrant of right female breast: Secondary | ICD-10-CM | POA: Diagnosis not present

## 2020-03-28 DIAGNOSIS — I272 Pulmonary hypertension, unspecified: Secondary | ICD-10-CM | POA: Diagnosis not present

## 2020-03-28 DIAGNOSIS — Z7982 Long term (current) use of aspirin: Secondary | ICD-10-CM | POA: Diagnosis not present

## 2020-03-28 DIAGNOSIS — I5032 Chronic diastolic (congestive) heart failure: Secondary | ICD-10-CM | POA: Diagnosis not present

## 2020-03-28 DIAGNOSIS — I110 Hypertensive heart disease with heart failure: Secondary | ICD-10-CM | POA: Diagnosis not present

## 2020-03-28 NOTE — Progress Notes (Signed)
Cardiology Office Note:    Date:  03/28/2020   ID:  Olivia [name], DOB [birth year], MRN [id number]  PCP:  Nicoletta Dress, MD  Cardiologist:  Berniece Salines, DO  Electrophysiologist:  None   Referring MD: Nicoletta Dress, MD   I am doing great  History of Present Illness:    Olivia [name] is a 87 y.o. female with a hx of coronary disease status post CABG with PCI in 2008, mitral valve repair in 2008 ischemic cardiomyopathy, chronic systolic heart failure EF in October 30 5 to 40% pulmonary hypertension, mild aortic stenosis, mild to moderate mitral regurgitation is here today for follow-up visit.  At her last visit which was January 02, 2020 at that time she was with her daughter.  She was post hospitalization and had a heart cath.  I continue all her medications and at that time she was not on an ACE inhibitor and Entresto due to the fact that her blood pressure was on the lower side.  She is here today for follow-up visit she has been doing well from a cardiovascular standpoint she tells me.  She is back at home and according to her daughter she is doing pretty much everything for herself.  Past Medical History:  Diagnosis Date  . Age-related osteoporosis without current pathological fracture 10/29/2019  . Atherosclerotic heart disease of native coronary artery without angina pectoris 10/02/2014  . Breast cancer ([place])   . CAD (coronary artery disease) hx of cabg  . CHF (congestive heart failure), NYHA class III, acute, systolic ([place]) 05/11/8117  . Chronic systolic heart failure ([place])   . Drug-induced skin rash 03/28/2016  . Dysphagia as late effect of cerebrovascular accident (CVA) 04/02/2016  . Dyspnea   . Elevated troponin   . Embolic stroke involving right middle cerebral artery ([place]) 03/25/2016  . Essential (primary) hypertension 10/02/2014  . History of mitral valve repair 2008  . Hx of CABG 2008  . Hyperlipidemia with target LDL less than 70   . Hypertension   . Ischemic  dilated cardiomyopathy ([place]) 03/15/2017  . Long-term use of aspirin therapy 07/20/2017  . Malignant neoplasm of upper-outer quadrant of right female breast ([place])   . Mixed hyperlipidemia 05/28/2017  . Non-rheumatic mitral regurgitation 03/15/2017  . Nonrheumatic aortic valve insufficiency 03/15/2017  . Old MI (myocardial infarction) 05/28/2017  . Other specified postprocedural states 10/02/2014  . Palpitations 05/28/2017  . Protein-calorie malnutrition, severe 11/23/2019  . PVD (peripheral vascular disease) ([place])   . S/P CABG (coronary artery bypass graft) 03/15/2017  . Senile osteoporosis   . Statin intolerance 03/15/2017    Past Surgical History:  Procedure Laterality Date  . BREAST LUMPECTOMY    . IR FEM POP ART ATHERECT INC PTA MOD SED  02/28/2020  . IR ILIAC ART STENT INC PTA MOD SED  02/28/2020  . IR US GUIDE VASC ACCESS LEFT  02/28/2020  . MITRAL VALVE REPAIR (MV)/CORONARY ARTERY BYPASS GRAFTING (CABG)  2018  . RIGHT/LEFT HEART CATH AND CORONARY/GRAFT ANGIOGRAPHY N/A 11/17/2019   Procedure: RIGHT/LEFT HEART CATH AND CORONARY/GRAFT ANGIOGRAPHY;  Surgeon: Jettie Booze, MD;  Location: [facility];  Service: Cardiovascular;  Laterality: N/A;    Current Medications: Current Meds  Medication Sig  . aspirin EC 81 MG EC tablet Take 1 tablet (81 mg total) by mouth daily. Swallow whole.  . clopidogrel (PLAVIX) 75 MG tablet Take 75 mg by mouth daily.  . furosemide (LASIX) 20 MG tablet Take 1 tablet by  mouth once daily (Patient taking differently: Take 20 mg by mouth daily.)  . levothyroxine (SYNTHROID) 50 MCG tablet Take 50 mcg by mouth daily before breakfast.  . MAGNESIUM CITRATE PO Take 400 mg by mouth in the morning, at noon, and at bedtime.  . metoprolol tartrate (LOPRESSOR) 25 MG tablet Take 25 mg by mouth 2 (two) times daily. Pt takes a total of 75 mg daily  . metoprolol tartrate (LOPRESSOR) 50 MG tablet Take 50 mg by mouth 2 (two) times daily. Pt takes a total of 75 mg daily   . nitroGLYCERIN (NITROSTAT) 0.4 MG SL tablet Place 0.4 mg under the tongue every 5 (five) minutes as needed for chest pain.  Marland Kitchen ondansetron (ZOFRAN ODT) 4 MG disintegrating tablet Take 1 tablet (4 mg total) by mouth every 8 (eight) hours as needed for nausea or vomiting.  . pantoprazole (PROTONIX) 40 MG tablet Take 40 mg by mouth daily.  . promethazine (PHENERGAN) 12.5 MG tablet Take 12.5 mg by mouth 4 (four) times daily as needed for nausea/vomiting.  . Red Yeast Rice 600 MG CAPS Take 600 mg by mouth in the morning and at bedtime.  Marland Kitchen spironolactone (ALDACTONE) 25 MG tablet Take 0.5 tablets (12.5 mg total) by mouth daily.  . traMADol (ULTRAM) 50 MG tablet Take 50 mg by mouth every 6 (six) hours as needed for severe pain.  . [DISCONTINUED] predniSONE (DELTASONE) 50 MG tablet Take 1 tablet 13 hrs, 7hrs, and 1 hr prior to procedure scheduled for 1:00PM 02/28/20.     Allergies:   Iodinated diagnostic agents, Ticagrelor, and Statins   Social History   Socioeconomic History  . Marital status: Widowed    Spouse name: Not on file  . Number of children: Not on file  . Years of education: Not on file  . Highest education level: Not on file  Occupational History  . Not on file  Tobacco Use  . Smoking status: Former Smoker    Quit date: 2008    Years since quitting: 14.1  . Smokeless tobacco: Never Used  Substance and Sexual Activity  . Alcohol use: Not Currently  . Drug use: Never  . Sexual activity: Not on file  Other Topics Concern  . Not on file  Social History Narrative  . Not on file   Social Determinants of Health   Financial Resource Strain: Not on file  Food Insecurity: Not on file  Transportation Needs: Not on file  Physical Activity: Not on file  Stress: Not on file  Social Connections: Not on file     Family History: The patient's family history includes Hypertension in her father.  ROS:   Review of Systems  Constitution: Negative for decreased appetite, fever and  weight gain.  HENT: Negative for congestion, ear discharge, hoarse voice and sore throat.   Eyes: Negative for discharge, redness, vision loss in right eye and visual halos.  Cardiovascular: Negative for chest pain, dyspnea on exertion, leg swelling, orthopnea and palpitations.  Respiratory: Negative for cough, hemoptysis, shortness of breath and snoring.   Endocrine: Negative for heat intolerance and polyphagia.  Hematologic/Lymphatic: Negative for bleeding problem. Does not bruise/bleed easily.  Skin: Negative for flushing, nail changes, rash and suspicious lesions.  Musculoskeletal: Negative for arthritis, joint pain, muscle cramps, myalgias, neck pain and stiffness.  Gastrointestinal: Negative for abdominal pain, bowel incontinence, diarrhea and excessive appetite.  Genitourinary: Negative for decreased libido, genital sores and incomplete emptying.  Neurological: Negative for brief paralysis, focal weakness, headaches and loss  of balance.  Psychiatric/Behavioral: Negative for altered mental status, depression and suicidal ideas.  Allergic/Immunologic: Negative for HIV exposure and persistent infections.    EKGs/Labs/Other Studies Reviewed:    The following studies were reviewed today:   EKG: None today  March 21, 2020 IMPRESSIONS    1. Left ventricular ejection fraction, by estimation, is 55 to 60%. The  left ventricle has normal function. The left ventricle has no regional  wall motion abnormalities. There is moderate left ventricular hypertrophy.  2. Right ventricular systolic function is normal. The right ventricular  size is normal. Mildly increased right ventricular wall thickness. There  is normal pulmonary artery systolic pressure.  3. Left atrial size was mildly dilated.  4. The mitral valve is myxomatous. Mild mitral valve regurgitation. No  evidence of mitral stenosis.  5. The aortic valve was not well visualized. Aortic valve regurgitation  is moderate.  Mild aortic valve sclerosis is present, with no evidence of  aortic valve stenosis.  6. The inferior vena cava is normal in size with greater than 50%  respiratory variability, suggesting right atrial pressure of 3 mmHg.   FINDINGS  Left Ventricle: Left ventricular ejection fraction, by estimation, is 55  to 60%. The left ventricle has normal function. The left ventricle has no  regional wall motion abnormalities. The left ventricular internal cavity  size was normal in size. There is  moderate left ventricular hypertrophy. Left ventricular diastolic  parameters are consistent with Grade II diastolic dysfunction  (pseudonormalization).   Right Ventricle: The right ventricular size is normal. Mildly increased  right ventricular wall thickness. Right ventricular systolic function is  normal. There is normal pulmonary artery systolic pressure. The tricuspid  regurgitant velocity is 2.65 m/s,  and with an assumed right atrial pressure of 3 mmHg, the estimated right  ventricular systolic pressure is 93.2 mmHg.   Left Atrium: Left atrial size was mildly dilated.   Right Atrium: Right atrial size was normal in size.   Pericardium: There is no evidence of pericardial effusion.   Mitral Valve: The mitral valve is myxomatous. Mild mitral valve  regurgitation. No evidence of mitral valve stenosis. MV peak gradient, 9.7  mmHg. The mean mitral valve gradient is 4.0 mmHg.   Tricuspid Valve: The tricuspid valve is normal in structure. Tricuspid  valve regurgitation is mild . No evidence of tricuspid stenosis.   Aortic Valve: The aortic valve was not well visualized. Aortic valve  regurgitation is moderate. Aortic regurgitation PHT measures 286 msec.  Mild aortic valve sclerosis is present, with no evidence of aortic valve  stenosis. Aortic valve mean gradient  measures 12.0 mmHg. Aortic valve peak gradient measures 20.2 mmHg. Aortic  valve area, by VTI measures 1.52 cm.   Pulmonic  Valve: The pulmonic valve was normal in structure. Pulmonic valve  regurgitation is not visualized. No evidence of pulmonic stenosis.   Aorta: The aortic root is normal in size and structure.   Venous: The inferior vena cava is normal in size with greater than 50%  respiratory variability, suggesting right atrial pressure of 3 mmHg.   IAS/Shunts: No atrial level shunt detected by color flow Doppler.   Cath: 11/17/19 Prox Cx to Mid Cx lesion is 99% stenosed. SVG to OM is occluded.  Prox LAD lesion is 70% stenosed. Mid LAD lesion is 75% stenosed. Patent LIMA to LAD.  Mid RCA lesion is 100% stenosed. Occluded SVG to RCA.  Severe three vessel disease.  The left ventricular ejection fraction is 35-45% by  visual estimate.  LV end diastolic pressure is normal.  There is mild aortic valve stenosis.  Ao sat 87%, PA sat 54%, PA pressure 54/20, mean PA 37 mm Hg, mean PCWP 23 mm Hg; CO 3.89 L/min; CI 2.4  Hemodynamic findings consistent with mild pulmonary hypertension.  Severe PAD as well with heavily calcified right common femoral artery    Recent Labs: 12/06/2019: ALT 16; B Natriuretic Peptide 884.4; TSH 5.340 01/02/2020: Magnesium 2.6 02/28/2020: BUN 29; Creatinine, Ser 1.18; Hemoglobin 11.0; Platelets 322; Potassium 4.8; Sodium 140  Recent Lipid Panel    Component Value Date/Time   CHOL 221 (H) 11/21/2019 0712   TRIG 165 (H) 11/21/2019 0712   HDL 45 11/21/2019 0712   CHOLHDL 4.9 11/21/2019 0712   VLDL 33 11/21/2019 0712   LDLCALC 143 (H) 11/21/2019 0712    Physical Exam:    VS:  BP 110/68   Pulse 74   Ht 5\' 7"  (1.702 m)   Wt 125 lb (56.7 kg)   SpO2 94%   BMI 19.58 kg/m     Wt Readings from Last 3 Encounters:  03/28/20 125 lb (56.7 kg)  02/28/20 125 lb (56.7 kg)  01/02/20 125 lb (56.7 kg)     GEN: Well nourished, well developed in no acute distress HEENT: Normal NECK: No JVD; No carotid bruits LYMPHATICS: No lymphadenopathy CARDIAC: S1S2 noted,RRR, no  murmurs, rubs, gallops RESPIRATORY:  Clear to auscultation without rales, wheezing or rhonchi  ABDOMEN: Soft, non-tender, non-distended, +bowel sounds, no guarding. EXTREMITIES: No edema, No cyanosis, no clubbing MUSCULOSKELETAL:  No deformity  SKIN: Warm and dry NEUROLOGIC:  Alert and oriented x 3, non-focal PSYCHIATRIC:  Normal affect, good insight  ASSESSMENT:    1. CHF (congestive heart failure), NYHA class III, acute, systolic ([place])   2. Atherosclerosis of native coronary artery of native heart without angina pectoris   3. Essential (primary) hypertension   4. Ischemic dilated cardiomyopathy ([place])   5. PVD (peripheral vascular disease) ([place])   6. Hyperlipidemia with target LDL less than 70   7. History of mitral valve repair    PLAN:     She appears to be doing well from a cardiovascular standpoint.  Her ejection fraction has recovered she is 55 to 60% now therefore we will hold off on adding any additional medication to her regimen.  We will keep her on her beta-blocker as well as her Aldactone for now.  She will continue her aspirin, Plavix and statin as well.  I plan to hopefully take her off Aldactone by her next visit.  We will plan to repeat her echocardiogram by her next visit.  The patient is in agreement with the above plan. The patient left the office in stable condition.  The patient will follow up in   Medication Adjustments/Labs and Tests Ordered: Current medicines are reviewed at length with the patient today.  Concerns regarding medicines are outlined above.  No orders of the defined types were placed in this encounter.  No orders of the defined types were placed in this encounter.   Patient Instructions  Medication Instructions:  Your physician recommends that you continue on your current medications as directed. Please refer to the Current Medication list given to you today.  *If you need a refill on your cardiac medications before your next appointment,  please call your pharmacy*   Lab Work: None If you have labs (blood work) drawn today and your tests are completely normal, you will receive your results only  by: . MyChart Message (if you have MyChart) OR . A paper copy in the mail If you have any lab test that is abnormal or we need to change your treatment, we will call you to review the results.   Testing/Procedures: None   Follow-Up: At [facility], you and your health needs are our priority.  As part of our continuing mission to provide you with exceptional heart care, we have created designated Provider Care Teams.  These Care Teams include your primary Cardiologist (physician) and Advanced Practice Providers (APPs -  Physician Assistants and Nurse Practitioners) who all work together to provide you with the care you need, when you need it.  We recommend signing up for the patient portal called "MyChart".  Sign up information is provided on this After Visit Summary.  MyChart is used to connect with patients for Virtual Visits (Telemedicine).  Patients are able to view lab/test results, encounter notes, upcoming appointments, etc.  Non-urgent messages can be sent to your provider as well.   To learn more about what you can do with MyChart, go to [email or web address].    Your next appointment:   6 month(s)  The format for your next appointment:   In Person  Provider:   Berniece Salines, DO   Other Instructions      Adopting a Healthy Lifestyle.  Know what a healthy weight is for you (roughly BMI <25) and aim to maintain this   Aim for 7+ servings of fruits and vegetables daily   65-80+ fluid ounces of water or unsweet tea for healthy kidneys   Limit to max 1 drink of alcohol per day; avoid smoking/tobacco   Limit animal fats in diet for cholesterol and heart health - choose grass fed whenever available   Avoid highly processed foods, and foods high in saturated/trans fats   Aim for low stress - take time to  unwind and care for your mental health   Aim for 150 min of moderate intensity exercise weekly for heart health, and weights twice weekly for bone health   Aim for 7-9 hours of sleep daily   When it comes to diets, agreement about the perfect plan isnt easy to find, even among the experts. Experts at the [employer] developed an idea known as the Healthy Eating Plate. Just imagine a plate divided into logical, healthy portions.   The emphasis is on diet quality:   Load up on vegetables and fruits - one-half of your plate: Aim for color and variety, and remember that potatoes dont count.   Go for whole grains - one-quarter of your plate: Whole wheat, barley, wheat berries, quinoa, oats, brown rice, and foods made with them. If you want pasta, go with whole wheat pasta.   Protein power - one-quarter of your plate: Fish, chicken, beans, and nuts are all healthy, versatile protein sources. Limit red meat.   The diet, however, does go beyond the plate, offering a few other suggestions.   Use healthy plant oils, such as olive, canola, soy, corn, sunflower and peanut. Check the labels, and avoid partially hydrogenated oil, which have unhealthy trans fats.   If youre thirsty, drink water. Coffee and tea are good in moderation, but skip sugary drinks and limit milk and dairy products to one or two daily servings.   The type of carbohydrate in the diet is more important than the amount. Some sources of carbohydrates, such as vegetables, fruits, whole grains, and beans-are healthier than others.  Finally, stay active  Signed, Berniece Salines, DO  03/28/2020 4:58 PM    [facility]

## 2020-03-28 NOTE — Patient Instructions (Signed)

## 2020-03-29 ENCOUNTER — Encounter: Payer: Self-pay | Admitting: Sports Medicine

## 2020-03-29 ENCOUNTER — Ambulatory Visit: Payer: Medicare Other | Admitting: Sports Medicine

## 2020-03-29 DIAGNOSIS — I739 Peripheral vascular disease, unspecified: Secondary | ICD-10-CM | POA: Diagnosis not present

## 2020-03-29 DIAGNOSIS — R234 Changes in skin texture: Secondary | ICD-10-CM | POA: Diagnosis not present

## 2020-03-29 DIAGNOSIS — L84 Corns and callosities: Secondary | ICD-10-CM

## 2020-03-29 DIAGNOSIS — M79675 Pain in left toe(s): Secondary | ICD-10-CM

## 2020-03-29 DIAGNOSIS — M79674 Pain in right toe(s): Secondary | ICD-10-CM

## 2020-03-29 NOTE — Progress Notes (Signed)
Subjective: Olivia [name] is a 87 y.o. female patient who returns to the office for follow-up evaluation cellulitis or redness to the foot and eschar to right great toe. Patient is assisted by son who reports that her toe seems to be getting better and that they finish all the antibiotics without any problems.  There is still some swelling occasionally but otherwise doing okay.  Patient has no other pedal complaints noted.  Patient Active Problem List   Diagnosis Date Noted  . Breast cancer ([place])   . CAD (coronary artery disease)   . Chronic systolic heart failure ([place])   . Hyperlipidemia with target LDL less than 70   . Hypertension   . Malignant neoplasm of upper-outer quadrant of right female breast ([place])   . PVD (peripheral vascular disease) ([place])   . Senile osteoporosis   . Dyspnea   . Protein-calorie malnutrition, severe 11/23/2019  . Elevated troponin   . Age-related osteoporosis without current pathological fracture 10/29/2019  . Long-term use of aspirin therapy 07/20/2017  . Mixed hyperlipidemia 05/28/2017  . Old MI (myocardial infarction) 05/28/2017  . Palpitations 05/28/2017  . CHF (congestive heart failure), NYHA class III, acute, systolic ([place]) 17/61/6073  . Ischemic dilated cardiomyopathy ([place]) 03/15/2017  . Nonrheumatic aortic valve insufficiency 03/15/2017  . Non-rheumatic mitral regurgitation 03/15/2017  . S/P CABG (coronary artery bypass graft) 03/15/2017  . Statin intolerance 03/15/2017  . Dysphagia as late effect of cerebrovascular accident (CVA) 04/02/2016  . Drug-induced skin rash 03/28/2016  . Embolic stroke involving right middle cerebral artery ([place]) 03/25/2016  . Atherosclerotic heart disease of native coronary artery without angina pectoris 10/02/2014  . Essential (primary) hypertension 10/02/2014  . Other specified postprocedural states 10/02/2014  . History of mitral valve repair 2008  . Hx of CABG 2008    Current Outpatient Medications on File  Prior to Visit  Medication Sig Dispense Refill  . aspirin EC 81 MG EC tablet Take 1 tablet (81 mg total) by mouth daily. Swallow whole. 30 tablet 11  . clopidogrel (PLAVIX) 75 MG tablet Take 75 mg by mouth daily.    . furosemide (LASIX) 20 MG tablet Take 1 tablet by mouth once daily (Patient taking differently: Take 20 mg by mouth daily.) 90 tablet 3  . levothyroxine (SYNTHROID) 50 MCG tablet Take 50 mcg by mouth daily before breakfast.    . MAGNESIUM CITRATE PO Take 400 mg by mouth in the morning, at noon, and at bedtime.    . metoprolol tartrate (LOPRESSOR) 25 MG tablet Take 25 mg by mouth 2 (two) times daily. Pt takes a total of 75 mg daily    . metoprolol tartrate (LOPRESSOR) 50 MG tablet Take 50 mg by mouth 2 (two) times daily. Pt takes a total of 75 mg daily    . nitroGLYCERIN (NITROSTAT) 0.4 MG SL tablet Place 0.4 mg under the tongue every 5 (five) minutes as needed for chest pain.    Marland Kitchen ondansetron (ZOFRAN ODT) 4 MG disintegrating tablet Take 1 tablet (4 mg total) by mouth every 8 (eight) hours as needed for nausea or vomiting. 20 tablet 0  . pantoprazole (PROTONIX) 40 MG tablet Take 40 mg by mouth daily.    . promethazine (PHENERGAN) 12.5 MG tablet Take 12.5 mg by mouth 4 (four) times daily as needed for nausea/vomiting.    . Red Yeast Rice 600 MG CAPS Take 600 mg by mouth in the morning and at bedtime.    Marland Kitchen spironolactone (ALDACTONE) 25 MG tablet Take  0.5 tablets (12.5 mg total) by mouth daily. 30 tablet 0  . traMADol (ULTRAM) 50 MG tablet Take 50 mg by mouth every 6 (six) hours as needed for severe pain.     No current facility-administered medications on file prior to visit.    Allergies  Allergen Reactions  . Iodinated Diagnostic Agents Rash  . Ticagrelor Rash  . Statins Other (See Comments)    myalgias     Objective:  General: Alert and oriented x3 in no acute distress  Dermatology: Scaly skin and maceration in between toes is continuing to improve, there is a dry eschar  noted to the distal tuft of the right hallux that appears to be getting smaller, blanchable erythema to the forefoot no warmth no malodor no active drainage that appears to be getting better. Preulcerative callus noted to right heel noted signs of infection.  Vascular: Dorsalis Pedis and Posterior Tibial pedal pulses nonpalpable however capillary fill time seems to be slightly improved at 3 seconds, (+) dependent rubor that improves with elevation, scant pedal hair growth bilateral, no significant edema bilateral lower extremities however per patient sometimes the right foot swells, Temperature gradient decreased bilateral.  Neurology: Gross sensation intact via light touch bilateral.  Musculoskeletal: Mild tenderness with palpation all toes bilateral.  There is significant bunion and hammertoe deformity noted.  Pes planus deformity noted.   Assessment and Plan: Problem List Items Addressed This Visit   None   Visit Diagnoses    Eschar of toe    -  Primary   PAD (peripheral artery disease) (HCC)       Toe pain, bilateral       Pre-ulcerative calluses         -Complete examination performed -Re-Discussed treatment options eschar to toe and PAD and preulcerative nature of skin changes at heel on right -Cleansed wounds on right and applied Medihoney to first toe and betadine to all other toes and to the heel and clean sock and advised daughter to dress using the same once every other day -Continue with bedroom shoes that do not rub toes and heel offloading padding -Continue with vascular follow-up -Patient to return to office in 2-3 weeks for re-check of toe.  Landis Martins, DPM

## 2020-04-04 DIAGNOSIS — E785 Hyperlipidemia, unspecified: Secondary | ICD-10-CM | POA: Diagnosis not present

## 2020-04-04 DIAGNOSIS — Z7902 Long term (current) use of antithrombotics/antiplatelets: Secondary | ICD-10-CM | POA: Diagnosis not present

## 2020-04-04 DIAGNOSIS — I255 Ischemic cardiomyopathy: Secondary | ICD-10-CM | POA: Diagnosis not present

## 2020-04-04 DIAGNOSIS — R5382 Chronic fatigue, unspecified: Secondary | ICD-10-CM | POA: Diagnosis not present

## 2020-04-04 DIAGNOSIS — Z7982 Long term (current) use of aspirin: Secondary | ICD-10-CM | POA: Diagnosis not present

## 2020-04-04 DIAGNOSIS — I110 Hypertensive heart disease with heart failure: Secondary | ICD-10-CM | POA: Diagnosis not present

## 2020-04-04 DIAGNOSIS — M1991 Primary osteoarthritis, unspecified site: Secondary | ICD-10-CM | POA: Diagnosis not present

## 2020-04-04 DIAGNOSIS — I739 Peripheral vascular disease, unspecified: Secondary | ICD-10-CM | POA: Diagnosis not present

## 2020-04-04 DIAGNOSIS — I350 Nonrheumatic aortic (valve) stenosis: Secondary | ICD-10-CM | POA: Diagnosis not present

## 2020-04-04 DIAGNOSIS — I5032 Chronic diastolic (congestive) heart failure: Secondary | ICD-10-CM | POA: Diagnosis not present

## 2020-04-04 DIAGNOSIS — E43 Unspecified severe protein-calorie malnutrition: Secondary | ICD-10-CM | POA: Diagnosis not present

## 2020-04-04 DIAGNOSIS — Z951 Presence of aortocoronary bypass graft: Secondary | ICD-10-CM | POA: Diagnosis not present

## 2020-04-04 DIAGNOSIS — I2510 Atherosclerotic heart disease of native coronary artery without angina pectoris: Secondary | ICD-10-CM | POA: Diagnosis not present

## 2020-04-04 DIAGNOSIS — I5023 Acute on chronic systolic (congestive) heart failure: Secondary | ICD-10-CM | POA: Diagnosis not present

## 2020-04-04 DIAGNOSIS — Z87891 Personal history of nicotine dependence: Secondary | ICD-10-CM | POA: Diagnosis not present

## 2020-04-04 DIAGNOSIS — Z79899 Other long term (current) drug therapy: Secondary | ICD-10-CM | POA: Diagnosis not present

## 2020-04-04 DIAGNOSIS — Z952 Presence of prosthetic heart valve: Secondary | ICD-10-CM | POA: Diagnosis not present

## 2020-04-04 DIAGNOSIS — I272 Pulmonary hypertension, unspecified: Secondary | ICD-10-CM | POA: Diagnosis not present

## 2020-04-04 DIAGNOSIS — C50411 Malignant neoplasm of upper-outer quadrant of right female breast: Secondary | ICD-10-CM | POA: Diagnosis not present

## 2020-04-06 DIAGNOSIS — I509 Heart failure, unspecified: Secondary | ICD-10-CM | POA: Diagnosis not present

## 2020-04-11 DIAGNOSIS — I2510 Atherosclerotic heart disease of native coronary artery without angina pectoris: Secondary | ICD-10-CM | POA: Diagnosis not present

## 2020-04-11 DIAGNOSIS — R5382 Chronic fatigue, unspecified: Secondary | ICD-10-CM | POA: Diagnosis not present

## 2020-04-11 DIAGNOSIS — Z7902 Long term (current) use of antithrombotics/antiplatelets: Secondary | ICD-10-CM | POA: Diagnosis not present

## 2020-04-11 DIAGNOSIS — E43 Unspecified severe protein-calorie malnutrition: Secondary | ICD-10-CM | POA: Diagnosis not present

## 2020-04-11 DIAGNOSIS — I739 Peripheral vascular disease, unspecified: Secondary | ICD-10-CM | POA: Diagnosis not present

## 2020-04-11 DIAGNOSIS — I272 Pulmonary hypertension, unspecified: Secondary | ICD-10-CM | POA: Diagnosis not present

## 2020-04-11 DIAGNOSIS — I110 Hypertensive heart disease with heart failure: Secondary | ICD-10-CM | POA: Diagnosis not present

## 2020-04-11 DIAGNOSIS — I5032 Chronic diastolic (congestive) heart failure: Secondary | ICD-10-CM | POA: Diagnosis not present

## 2020-04-11 DIAGNOSIS — Z7982 Long term (current) use of aspirin: Secondary | ICD-10-CM | POA: Diagnosis not present

## 2020-04-11 DIAGNOSIS — M1991 Primary osteoarthritis, unspecified site: Secondary | ICD-10-CM | POA: Diagnosis not present

## 2020-04-11 DIAGNOSIS — C50411 Malignant neoplasm of upper-outer quadrant of right female breast: Secondary | ICD-10-CM | POA: Diagnosis not present

## 2020-04-11 DIAGNOSIS — Z952 Presence of prosthetic heart valve: Secondary | ICD-10-CM | POA: Diagnosis not present

## 2020-04-11 DIAGNOSIS — Z87891 Personal history of nicotine dependence: Secondary | ICD-10-CM | POA: Diagnosis not present

## 2020-04-11 DIAGNOSIS — I350 Nonrheumatic aortic (valve) stenosis: Secondary | ICD-10-CM | POA: Diagnosis not present

## 2020-04-11 DIAGNOSIS — Z79899 Other long term (current) drug therapy: Secondary | ICD-10-CM | POA: Diagnosis not present

## 2020-04-11 DIAGNOSIS — I5023 Acute on chronic systolic (congestive) heart failure: Secondary | ICD-10-CM | POA: Diagnosis not present

## 2020-04-11 DIAGNOSIS — E785 Hyperlipidemia, unspecified: Secondary | ICD-10-CM | POA: Diagnosis not present

## 2020-04-11 DIAGNOSIS — Z951 Presence of aortocoronary bypass graft: Secondary | ICD-10-CM | POA: Diagnosis not present

## 2020-04-11 DIAGNOSIS — I255 Ischemic cardiomyopathy: Secondary | ICD-10-CM | POA: Diagnosis not present

## 2020-04-17 DIAGNOSIS — Z7982 Long term (current) use of aspirin: Secondary | ICD-10-CM | POA: Diagnosis not present

## 2020-04-17 DIAGNOSIS — I255 Ischemic cardiomyopathy: Secondary | ICD-10-CM | POA: Diagnosis not present

## 2020-04-17 DIAGNOSIS — M1991 Primary osteoarthritis, unspecified site: Secondary | ICD-10-CM | POA: Diagnosis not present

## 2020-04-17 DIAGNOSIS — Z79899 Other long term (current) drug therapy: Secondary | ICD-10-CM | POA: Diagnosis not present

## 2020-04-17 DIAGNOSIS — R5382 Chronic fatigue, unspecified: Secondary | ICD-10-CM | POA: Diagnosis not present

## 2020-04-17 DIAGNOSIS — I5023 Acute on chronic systolic (congestive) heart failure: Secondary | ICD-10-CM | POA: Diagnosis not present

## 2020-04-17 DIAGNOSIS — Z951 Presence of aortocoronary bypass graft: Secondary | ICD-10-CM | POA: Diagnosis not present

## 2020-04-17 DIAGNOSIS — I350 Nonrheumatic aortic (valve) stenosis: Secondary | ICD-10-CM | POA: Diagnosis not present

## 2020-04-17 DIAGNOSIS — I739 Peripheral vascular disease, unspecified: Secondary | ICD-10-CM | POA: Diagnosis not present

## 2020-04-17 DIAGNOSIS — E785 Hyperlipidemia, unspecified: Secondary | ICD-10-CM | POA: Diagnosis not present

## 2020-04-17 DIAGNOSIS — Z952 Presence of prosthetic heart valve: Secondary | ICD-10-CM | POA: Diagnosis not present

## 2020-04-17 DIAGNOSIS — I2510 Atherosclerotic heart disease of native coronary artery without angina pectoris: Secondary | ICD-10-CM | POA: Diagnosis not present

## 2020-04-17 DIAGNOSIS — Z7902 Long term (current) use of antithrombotics/antiplatelets: Secondary | ICD-10-CM | POA: Diagnosis not present

## 2020-04-17 DIAGNOSIS — I272 Pulmonary hypertension, unspecified: Secondary | ICD-10-CM | POA: Diagnosis not present

## 2020-04-17 DIAGNOSIS — I110 Hypertensive heart disease with heart failure: Secondary | ICD-10-CM | POA: Diagnosis not present

## 2020-04-17 DIAGNOSIS — E43 Unspecified severe protein-calorie malnutrition: Secondary | ICD-10-CM | POA: Diagnosis not present

## 2020-04-17 DIAGNOSIS — Z87891 Personal history of nicotine dependence: Secondary | ICD-10-CM | POA: Diagnosis not present

## 2020-04-17 DIAGNOSIS — C50411 Malignant neoplasm of upper-outer quadrant of right female breast: Secondary | ICD-10-CM | POA: Diagnosis not present

## 2020-04-17 DIAGNOSIS — I5032 Chronic diastolic (congestive) heart failure: Secondary | ICD-10-CM | POA: Diagnosis not present

## 2020-04-19 ENCOUNTER — Encounter: Payer: Self-pay | Admitting: Sports Medicine

## 2020-04-19 ENCOUNTER — Other Ambulatory Visit: Payer: Self-pay

## 2020-04-19 ENCOUNTER — Ambulatory Visit: Payer: Medicare Other | Admitting: Sports Medicine

## 2020-04-19 DIAGNOSIS — M79674 Pain in right toe(s): Secondary | ICD-10-CM | POA: Diagnosis not present

## 2020-04-19 DIAGNOSIS — I739 Peripheral vascular disease, unspecified: Secondary | ICD-10-CM

## 2020-04-19 DIAGNOSIS — L84 Corns and callosities: Secondary | ICD-10-CM | POA: Diagnosis not present

## 2020-04-19 DIAGNOSIS — R234 Changes in skin texture: Secondary | ICD-10-CM | POA: Diagnosis not present

## 2020-04-19 NOTE — Progress Notes (Signed)
Subjective: Olivia [name] is a 87 y.o. female patient who returns to the office for follow-up wound care on right.  Patient is assisted by son who reports that things are looking better and little by little some of the hard skin is finally peeling off the great toe.  Patient does admit to a little bit of swelling and a little pain from the toes rubbing.  No other pedal complaints noted.  Patient Active Problem List   Diagnosis Date Noted  . Breast cancer ([place])   . CAD (coronary artery disease)   . Chronic systolic heart failure ([place])   . Hyperlipidemia with target LDL less than 70   . Hypertension   . Malignant neoplasm of upper-outer quadrant of right female breast ([place])   . PVD (peripheral vascular disease) ([place])   . Senile osteoporosis   . Dyspnea   . Protein-calorie malnutrition, severe 11/23/2019  . Elevated troponin   . Age-related osteoporosis without current pathological fracture 10/29/2019  . Long-term use of aspirin therapy 07/20/2017  . Mixed hyperlipidemia 05/28/2017  . Old MI (myocardial infarction) 05/28/2017  . Palpitations 05/28/2017  . CHF (congestive heart failure), NYHA class III, acute, systolic ([place]) 18/56/3149  . Ischemic dilated cardiomyopathy ([place]) 03/15/2017  . Nonrheumatic aortic valve insufficiency 03/15/2017  . Non-rheumatic mitral regurgitation 03/15/2017  . S/P CABG (coronary artery bypass graft) 03/15/2017  . Statin intolerance 03/15/2017  . Dysphagia as late effect of cerebrovascular accident (CVA) 04/02/2016  . Drug-induced skin rash 03/28/2016  . Embolic stroke involving right middle cerebral artery ([place]) 03/25/2016  . Atherosclerotic heart disease of native coronary artery without angina pectoris 10/02/2014  . Essential (primary) hypertension 10/02/2014  . Other specified postprocedural states 10/02/2014  . History of mitral valve repair 2008  . Hx of CABG 2008    Current Outpatient Medications on File Prior to Visit  Medication Sig Dispense  Refill  . aspirin EC 81 MG EC tablet Take 1 tablet (81 mg total) by mouth daily. Swallow whole. 30 tablet 11  . clopidogrel (PLAVIX) 75 MG tablet Take 75 mg by mouth daily.    . furosemide (LASIX) 20 MG tablet Take 1 tablet by mouth once daily (Patient taking differently: Take 20 mg by mouth daily.) 90 tablet 3  . levothyroxine (SYNTHROID) 50 MCG tablet Take 50 mcg by mouth daily before breakfast.    . MAGNESIUM CITRATE PO Take 400 mg by mouth in the morning, at noon, and at bedtime.    . metoprolol tartrate (LOPRESSOR) 25 MG tablet Take 25 mg by mouth 2 (two) times daily. Pt takes a total of 75 mg daily    . metoprolol tartrate (LOPRESSOR) 50 MG tablet Take 50 mg by mouth 2 (two) times daily. Pt takes a total of 75 mg daily    . nitroGLYCERIN (NITROSTAT) 0.4 MG SL tablet Place 0.4 mg under the tongue every 5 (five) minutes as needed for chest pain.    Marland Kitchen ondansetron (ZOFRAN ODT) 4 MG disintegrating tablet Take 1 tablet (4 mg total) by mouth every 8 (eight) hours as needed for nausea or vomiting. 20 tablet 0  . pantoprazole (PROTONIX) 40 MG tablet Take 40 mg by mouth daily.    . promethazine (PHENERGAN) 12.5 MG tablet Take 12.5 mg by mouth 4 (four) times daily as needed for nausea/vomiting.    . Red Yeast Rice 600 MG CAPS Take 600 mg by mouth in the morning and at bedtime.    Marland Kitchen spironolactone (ALDACTONE) 25 MG tablet Take  0.5 tablets (12.5 mg total) by mouth daily. 30 tablet 0  . traMADol (ULTRAM) 50 MG tablet Take 50 mg by mouth every 6 (six) hours as needed for severe pain.     No current facility-administered medications on file prior to visit.    Allergies  Allergen Reactions  . Iodinated Diagnostic Agents Rash  . Ticagrelor Rash  . Statins Other (See Comments)    myalgias     Objective:  General: Alert and oriented x3 in no acute distress  Dermatology: There are no maceration or scaling noted in between the toes of the right foot.  There is a dry eschar noted to the distal tuft of  the right hallux that appears to be getting smaller and slowly improving, blanchable erythema to the forefoot no warmth no malodor no active drainage that appears to be getting better. Preulcerative callus noted to right heel noted signs of infection.  Vascular: Dorsalis Pedis and Posterior Tibial pedal pulses nonpalpable however capillary fill time seems to be slightly improved at 3 seconds, (+) dependent rubor that improves with elevation, scant pedal hair growth bilateral, no significant edema bilateral lower extremities however per patient sometimes the right foot swells, Temperature gradient decreased bilateral.  Neurology: Gross sensation intact via light touch bilateral.  Musculoskeletal: Mild tenderness with palpation all toes bilateral.  There is significant bunion and hammertoe deformity noted.  Pes planus deformity noted.   Assessment and Plan: Problem List Items Addressed This Visit   None   Visit Diagnoses    Eschar of toe    -  Primary   PAD (peripheral artery disease) (HCC)       Pre-ulcerative calluses       Toe pain, right         -Complete examination performed -Re-Discussed treatment options eschar to toe and PAD and preulcerative nature of skin changes at heel on right -Cleansed wounds on right and applied Medihoney to first toe and clean sock and advised daughter to dress using the same once every other day -May discontinue Betadine in between the toes and may discontinue Betadine to the heel and advised son to have the daughter to use a protective Band-Aid to the heel for 1 more week and then after may discontinue -Continue with bedroom shoes that do not rub toes and heel offloading padding -Continue with vascular follow-up -Patient to return to office in 4-6 weeks for re-check of toe.  Landis Martins, DPM

## 2020-04-25 DIAGNOSIS — E43 Unspecified severe protein-calorie malnutrition: Secondary | ICD-10-CM | POA: Diagnosis not present

## 2020-04-25 DIAGNOSIS — E785 Hyperlipidemia, unspecified: Secondary | ICD-10-CM | POA: Diagnosis not present

## 2020-04-25 DIAGNOSIS — Z87891 Personal history of nicotine dependence: Secondary | ICD-10-CM | POA: Diagnosis not present

## 2020-04-25 DIAGNOSIS — Z952 Presence of prosthetic heart valve: Secondary | ICD-10-CM | POA: Diagnosis not present

## 2020-04-25 DIAGNOSIS — I5032 Chronic diastolic (congestive) heart failure: Secondary | ICD-10-CM | POA: Diagnosis not present

## 2020-04-25 DIAGNOSIS — I739 Peripheral vascular disease, unspecified: Secondary | ICD-10-CM | POA: Diagnosis not present

## 2020-04-25 DIAGNOSIS — I272 Pulmonary hypertension, unspecified: Secondary | ICD-10-CM | POA: Diagnosis not present

## 2020-04-25 DIAGNOSIS — Z7902 Long term (current) use of antithrombotics/antiplatelets: Secondary | ICD-10-CM | POA: Diagnosis not present

## 2020-04-25 DIAGNOSIS — I255 Ischemic cardiomyopathy: Secondary | ICD-10-CM | POA: Diagnosis not present

## 2020-04-25 DIAGNOSIS — C50411 Malignant neoplasm of upper-outer quadrant of right female breast: Secondary | ICD-10-CM | POA: Diagnosis not present

## 2020-04-25 DIAGNOSIS — Z7982 Long term (current) use of aspirin: Secondary | ICD-10-CM | POA: Diagnosis not present

## 2020-04-25 DIAGNOSIS — I350 Nonrheumatic aortic (valve) stenosis: Secondary | ICD-10-CM | POA: Diagnosis not present

## 2020-04-25 DIAGNOSIS — Z951 Presence of aortocoronary bypass graft: Secondary | ICD-10-CM | POA: Diagnosis not present

## 2020-04-25 DIAGNOSIS — R5382 Chronic fatigue, unspecified: Secondary | ICD-10-CM | POA: Diagnosis not present

## 2020-04-25 DIAGNOSIS — Z79899 Other long term (current) drug therapy: Secondary | ICD-10-CM | POA: Diagnosis not present

## 2020-04-25 DIAGNOSIS — M1991 Primary osteoarthritis, unspecified site: Secondary | ICD-10-CM | POA: Diagnosis not present

## 2020-04-25 DIAGNOSIS — I5023 Acute on chronic systolic (congestive) heart failure: Secondary | ICD-10-CM | POA: Diagnosis not present

## 2020-04-25 DIAGNOSIS — I2510 Atherosclerotic heart disease of native coronary artery without angina pectoris: Secondary | ICD-10-CM | POA: Diagnosis not present

## 2020-04-25 DIAGNOSIS — I110 Hypertensive heart disease with heart failure: Secondary | ICD-10-CM | POA: Diagnosis not present

## 2020-04-26 DIAGNOSIS — J208 Acute bronchitis due to other specified organisms: Secondary | ICD-10-CM | POA: Diagnosis not present

## 2020-04-26 DIAGNOSIS — J019 Acute sinusitis, unspecified: Secondary | ICD-10-CM | POA: Diagnosis not present

## 2020-04-26 DIAGNOSIS — B9689 Other specified bacterial agents as the cause of diseases classified elsewhere: Secondary | ICD-10-CM | POA: Diagnosis not present

## 2020-05-02 DIAGNOSIS — Z79899 Other long term (current) drug therapy: Secondary | ICD-10-CM | POA: Diagnosis not present

## 2020-05-02 DIAGNOSIS — I350 Nonrheumatic aortic (valve) stenosis: Secondary | ICD-10-CM | POA: Diagnosis not present

## 2020-05-02 DIAGNOSIS — M1991 Primary osteoarthritis, unspecified site: Secondary | ICD-10-CM | POA: Diagnosis not present

## 2020-05-02 DIAGNOSIS — Z87891 Personal history of nicotine dependence: Secondary | ICD-10-CM | POA: Diagnosis not present

## 2020-05-02 DIAGNOSIS — I5023 Acute on chronic systolic (congestive) heart failure: Secondary | ICD-10-CM | POA: Diagnosis not present

## 2020-05-02 DIAGNOSIS — I110 Hypertensive heart disease with heart failure: Secondary | ICD-10-CM | POA: Diagnosis not present

## 2020-05-02 DIAGNOSIS — Z951 Presence of aortocoronary bypass graft: Secondary | ICD-10-CM | POA: Diagnosis not present

## 2020-05-02 DIAGNOSIS — E43 Unspecified severe protein-calorie malnutrition: Secondary | ICD-10-CM | POA: Diagnosis not present

## 2020-05-02 DIAGNOSIS — R5382 Chronic fatigue, unspecified: Secondary | ICD-10-CM | POA: Diagnosis not present

## 2020-05-02 DIAGNOSIS — Z952 Presence of prosthetic heart valve: Secondary | ICD-10-CM | POA: Diagnosis not present

## 2020-05-02 DIAGNOSIS — Z7902 Long term (current) use of antithrombotics/antiplatelets: Secondary | ICD-10-CM | POA: Diagnosis not present

## 2020-05-02 DIAGNOSIS — I2510 Atherosclerotic heart disease of native coronary artery without angina pectoris: Secondary | ICD-10-CM | POA: Diagnosis not present

## 2020-05-02 DIAGNOSIS — E785 Hyperlipidemia, unspecified: Secondary | ICD-10-CM | POA: Diagnosis not present

## 2020-05-02 DIAGNOSIS — Z7982 Long term (current) use of aspirin: Secondary | ICD-10-CM | POA: Diagnosis not present

## 2020-05-02 DIAGNOSIS — I255 Ischemic cardiomyopathy: Secondary | ICD-10-CM | POA: Diagnosis not present

## 2020-05-02 DIAGNOSIS — C50411 Malignant neoplasm of upper-outer quadrant of right female breast: Secondary | ICD-10-CM | POA: Diagnosis not present

## 2020-05-02 DIAGNOSIS — I5032 Chronic diastolic (congestive) heart failure: Secondary | ICD-10-CM | POA: Diagnosis not present

## 2020-05-02 DIAGNOSIS — I272 Pulmonary hypertension, unspecified: Secondary | ICD-10-CM | POA: Diagnosis not present

## 2020-05-02 DIAGNOSIS — I739 Peripheral vascular disease, unspecified: Secondary | ICD-10-CM | POA: Diagnosis not present

## 2020-05-07 DIAGNOSIS — I509 Heart failure, unspecified: Secondary | ICD-10-CM | POA: Diagnosis not present

## 2020-05-09 DIAGNOSIS — I2510 Atherosclerotic heart disease of native coronary artery without angina pectoris: Secondary | ICD-10-CM | POA: Diagnosis not present

## 2020-05-09 DIAGNOSIS — I5023 Acute on chronic systolic (congestive) heart failure: Secondary | ICD-10-CM | POA: Diagnosis not present

## 2020-05-09 DIAGNOSIS — Z7902 Long term (current) use of antithrombotics/antiplatelets: Secondary | ICD-10-CM | POA: Diagnosis not present

## 2020-05-09 DIAGNOSIS — I272 Pulmonary hypertension, unspecified: Secondary | ICD-10-CM | POA: Diagnosis not present

## 2020-05-09 DIAGNOSIS — Z79899 Other long term (current) drug therapy: Secondary | ICD-10-CM | POA: Diagnosis not present

## 2020-05-09 DIAGNOSIS — R5382 Chronic fatigue, unspecified: Secondary | ICD-10-CM | POA: Diagnosis not present

## 2020-05-09 DIAGNOSIS — Z7982 Long term (current) use of aspirin: Secondary | ICD-10-CM | POA: Diagnosis not present

## 2020-05-09 DIAGNOSIS — M1991 Primary osteoarthritis, unspecified site: Secondary | ICD-10-CM | POA: Diagnosis not present

## 2020-05-09 DIAGNOSIS — I110 Hypertensive heart disease with heart failure: Secondary | ICD-10-CM | POA: Diagnosis not present

## 2020-05-09 DIAGNOSIS — Z87891 Personal history of nicotine dependence: Secondary | ICD-10-CM | POA: Diagnosis not present

## 2020-05-09 DIAGNOSIS — E43 Unspecified severe protein-calorie malnutrition: Secondary | ICD-10-CM | POA: Diagnosis not present

## 2020-05-09 DIAGNOSIS — Z952 Presence of prosthetic heart valve: Secondary | ICD-10-CM | POA: Diagnosis not present

## 2020-05-09 DIAGNOSIS — E785 Hyperlipidemia, unspecified: Secondary | ICD-10-CM | POA: Diagnosis not present

## 2020-05-09 DIAGNOSIS — I5032 Chronic diastolic (congestive) heart failure: Secondary | ICD-10-CM | POA: Diagnosis not present

## 2020-05-09 DIAGNOSIS — I255 Ischemic cardiomyopathy: Secondary | ICD-10-CM | POA: Diagnosis not present

## 2020-05-09 DIAGNOSIS — C50411 Malignant neoplasm of upper-outer quadrant of right female breast: Secondary | ICD-10-CM | POA: Diagnosis not present

## 2020-05-09 DIAGNOSIS — I350 Nonrheumatic aortic (valve) stenosis: Secondary | ICD-10-CM | POA: Diagnosis not present

## 2020-05-09 DIAGNOSIS — I739 Peripheral vascular disease, unspecified: Secondary | ICD-10-CM | POA: Diagnosis not present

## 2020-05-09 DIAGNOSIS — Z951 Presence of aortocoronary bypass graft: Secondary | ICD-10-CM | POA: Diagnosis not present

## 2020-05-13 ENCOUNTER — Inpatient Hospital Stay: Payer: Medicare Other

## 2020-05-14 ENCOUNTER — Other Ambulatory Visit: Payer: Self-pay | Admitting: Pharmacist

## 2020-05-16 DIAGNOSIS — I350 Nonrheumatic aortic (valve) stenosis: Secondary | ICD-10-CM | POA: Diagnosis not present

## 2020-05-16 DIAGNOSIS — I255 Ischemic cardiomyopathy: Secondary | ICD-10-CM | POA: Diagnosis not present

## 2020-05-16 DIAGNOSIS — I272 Pulmonary hypertension, unspecified: Secondary | ICD-10-CM | POA: Diagnosis not present

## 2020-05-16 DIAGNOSIS — I5023 Acute on chronic systolic (congestive) heart failure: Secondary | ICD-10-CM | POA: Diagnosis not present

## 2020-05-16 DIAGNOSIS — I739 Peripheral vascular disease, unspecified: Secondary | ICD-10-CM | POA: Diagnosis not present

## 2020-05-16 DIAGNOSIS — Z79899 Other long term (current) drug therapy: Secondary | ICD-10-CM | POA: Diagnosis not present

## 2020-05-16 DIAGNOSIS — I2510 Atherosclerotic heart disease of native coronary artery without angina pectoris: Secondary | ICD-10-CM | POA: Diagnosis not present

## 2020-05-16 DIAGNOSIS — E785 Hyperlipidemia, unspecified: Secondary | ICD-10-CM | POA: Diagnosis not present

## 2020-05-16 DIAGNOSIS — Z7902 Long term (current) use of antithrombotics/antiplatelets: Secondary | ICD-10-CM | POA: Diagnosis not present

## 2020-05-16 DIAGNOSIS — Z87891 Personal history of nicotine dependence: Secondary | ICD-10-CM | POA: Diagnosis not present

## 2020-05-16 DIAGNOSIS — E43 Unspecified severe protein-calorie malnutrition: Secondary | ICD-10-CM | POA: Diagnosis not present

## 2020-05-16 DIAGNOSIS — Z7982 Long term (current) use of aspirin: Secondary | ICD-10-CM | POA: Diagnosis not present

## 2020-05-16 DIAGNOSIS — I5032 Chronic diastolic (congestive) heart failure: Secondary | ICD-10-CM | POA: Diagnosis not present

## 2020-05-16 DIAGNOSIS — Z952 Presence of prosthetic heart valve: Secondary | ICD-10-CM | POA: Diagnosis not present

## 2020-05-16 DIAGNOSIS — I110 Hypertensive heart disease with heart failure: Secondary | ICD-10-CM | POA: Diagnosis not present

## 2020-05-16 DIAGNOSIS — M1991 Primary osteoarthritis, unspecified site: Secondary | ICD-10-CM | POA: Diagnosis not present

## 2020-05-16 DIAGNOSIS — Z951 Presence of aortocoronary bypass graft: Secondary | ICD-10-CM | POA: Diagnosis not present

## 2020-05-16 DIAGNOSIS — R5382 Chronic fatigue, unspecified: Secondary | ICD-10-CM | POA: Diagnosis not present

## 2020-05-16 DIAGNOSIS — C50411 Malignant neoplasm of upper-outer quadrant of right female breast: Secondary | ICD-10-CM | POA: Diagnosis not present

## 2020-05-17 ENCOUNTER — Other Ambulatory Visit: Payer: Self-pay

## 2020-05-17 ENCOUNTER — Encounter: Payer: Self-pay | Admitting: Hematology and Oncology

## 2020-05-17 ENCOUNTER — Inpatient Hospital Stay: Payer: Medicare Other | Attending: Hematology and Oncology | Admitting: Hematology and Oncology

## 2020-05-17 ENCOUNTER — Telehealth: Payer: Self-pay | Admitting: Hematology and Oncology

## 2020-05-17 ENCOUNTER — Other Ambulatory Visit: Payer: Self-pay | Admitting: Hematology and Oncology

## 2020-05-17 ENCOUNTER — Telehealth: Payer: Self-pay

## 2020-05-17 ENCOUNTER — Inpatient Hospital Stay: Payer: Medicare Other

## 2020-05-17 VITALS — BP 157/67 | HR 75 | Temp 97.9°F | Resp 20 | Ht 67.0 in | Wt 126.5 lb

## 2020-05-17 DIAGNOSIS — Z170 Estrogen receptor positive status [ER+]: Secondary | ICD-10-CM | POA: Diagnosis not present

## 2020-05-17 DIAGNOSIS — E162 Hypoglycemia, unspecified: Secondary | ICD-10-CM

## 2020-05-17 DIAGNOSIS — Z853 Personal history of malignant neoplasm of breast: Secondary | ICD-10-CM | POA: Insufficient documentation

## 2020-05-17 DIAGNOSIS — M810 Age-related osteoporosis without current pathological fracture: Secondary | ICD-10-CM | POA: Diagnosis not present

## 2020-05-17 DIAGNOSIS — D649 Anemia, unspecified: Secondary | ICD-10-CM | POA: Diagnosis not present

## 2020-05-17 DIAGNOSIS — C50411 Malignant neoplasm of upper-outer quadrant of right female breast: Secondary | ICD-10-CM

## 2020-05-17 DIAGNOSIS — R7309 Other abnormal glucose: Secondary | ICD-10-CM | POA: Diagnosis not present

## 2020-05-17 LAB — CBC AND DIFFERENTIAL
HCT: 36 (ref 36–46)
Hemoglobin: 11.5 — AB (ref 12.0–16.0)
Neutrophils Absolute: 5.54
Platelets: 272 (ref 150–399)
WBC: 7.8

## 2020-05-17 LAB — BASIC METABOLIC PANEL
BUN: 51 — AB (ref 4–21)
CO2: 28 — AB (ref 13–22)
Chloride: 104 (ref 99–108)
Creatinine: 1.3 — AB (ref 0.5–1.1)
Glucose: 128
Glucose: 42
Potassium: 4.3 (ref 3.4–5.3)
Sodium: 140 (ref 137–147)

## 2020-05-17 LAB — HEPATIC FUNCTION PANEL
ALT: 15 (ref 7–35)
AST: 29 (ref 13–35)
Alkaline Phosphatase: 55 (ref 25–125)
Bilirubin, Total: 0.5

## 2020-05-17 LAB — COMPREHENSIVE METABOLIC PANEL
Albumin: 4.5 (ref 3.5–5.0)
Calcium: 9.9 (ref 8.7–10.7)

## 2020-05-17 LAB — CBC
MCV: 85 (ref 81–99)
RBC: 4.28 (ref 3.87–5.11)

## 2020-05-17 NOTE — Telephone Encounter (Signed)
Critical glucose level of 42, reported to Geraldine,LPN, and [employer]

## 2020-05-17 NOTE — Telephone Encounter (Signed)
Per 4/15 los next appt scheduled and given to patient

## 2020-05-17 NOTE — Progress Notes (Signed)
[facility]  [address] [place],  [place]  [address] [phone]  Clinic Day:  05/17/2020  Referring physician: Nicoletta Dress, MD   CHIEF COMPLAINT:  CC:  Stage II B hormone receptor positive breast cancer and age-related osteoporosis  Current Treatment:    Prolia every 6 months   HISTORY OF PRESENT ILLNESS:  Olivia [name] is a 87 y.o. 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  She was scheduled for a right partial mastectomy in February 2018, but had an acute myocardial infarction and stroke.  She required placement of 2 coronary artery stents.  The surgery was delayed until August.  Final pathology revealed multifocal invasive ductal carcinoma with a 2.4 cm lesion and a 1.2 cm lesion, both grade 2 with clear margins, as well as intermediate grade ductal carcinoma in situ.  One of 2 nodes was positive with extracapsular extension and lymphovascular invasion.  Estrogen and progesterone receptors were positive and her 2 Neu was negative.  Ki 67 was 10%.  Due to her other medical comorbidities, we did not feel she was a candidate for aggressive chemotherapy.  She was placed on anastrozole 1 mg daily in September 2018.  Bone density scan in September 2018 revealed osteoporosis with a statistically significant decrease in the bone mineral density compared to the prior study of 2005. Her T-score of the right femur was -3.6, which was 6.8% worse, with a T-score of -3.2 of the right forearm.  She was already taking calcium 600 mg twice daily.  Due to the risk of worsening osteopenia with anastrozole, she was also placed on alendronate 70 mg weekly.  She went to the hospital in early 2020 twice for bleeding ulcers. She had four blood transfusions.  She was due for mammography in May, but this was delayed due to COVID-19.  Bilateral diagnostic mammogram in August of 2020 revealed a 3 cm area of  pleomorphic calcifications in the left breast.  There was no evidence of malignancy in the right breast.  Biopsy of the left breast lesion revealed focal atypical ductal hyperplasia with calcifications, with no in situ or invasive malignancy identified.  She underwent left lumpectomy in September.  Final pathology revealed atypical ductal hyperplasia and florid usual ductal hyperplasia with calcifications.  No in situ or invasive malignancy was identified.  Margins were clear.  Bone density from September 2020 revealed worsening osteoporosis with a T-score of -3.6 of the left femur, previously -3.4.  The right forearm measured -4.3, previously -3.2.  We therefore stopped the anastrozole after 2 years and switched to raloxifene for chemoprevention, which can also also treat her osteoporosis.  She was  placed on Prolia injections every 6 months for osteoporosis.   Due to severe arthralgias affecting her quality of life, we discontinued raloxifene in March 2021. Annual bilateral mammogram from August 2021 did not reveal any evidence of malignancy.  She presented to the emergency department in mid September, and then again on October 2nd due to acute kidney injury.  CT urogram from October revealed a 7 mm obstructing proximal right ureteral calculus at the L3-4 level, with  moderate right hydronephrosis, and a 16 mm calculus in the left renal pelvis. There were additional nonobstructing bilateral renal calculi, and a 17 mm hyperdense lesion in the anterior right upper kidney, possibly reflecting a hemorrhagic cyst.  She saw Dr. Nila Nephew he discontinued calcium and vitamin D.  She was  scheduled for lithotripsy.    INTERVAL HISTORY:  Olivia [name] is here today for repeat clinical assessment prior to Prolia. She states she is feeling well. She had lunch about 3 hours ago.  She denies weakness, shakiness, nausea or headache. She denies fevers or chills. She denies pain. Her appetite is good. Her weight has decreased 2 pounds  over last 6 months. She did not have lithotripsy last fall.  She ended up having issues with peripheral vascular disease and had a procedure for this.  She states she is now able to walk without difficulty.  REVIEW OF SYSTEMS:  Review of Systems  Constitutional: Negative for appetite change, chills, fatigue, fever and unexpected weight change.  HENT:   Negative for lump/mass, mouth sores and sore throat.   Respiratory: Negative for cough and shortness of breath.   Cardiovascular: Negative for chest pain and leg swelling.  Gastrointestinal: Negative for abdominal pain, constipation, diarrhea, nausea and vomiting.  Endocrine: Negative for hot flashes.  Genitourinary: Negative for difficulty urinating, dysuria, frequency and hematuria.   Musculoskeletal: Negative for arthralgias, back pain and myalgias.  Skin: Negative for rash.  Neurological: Negative for dizziness and headaches.  Hematological: Negative for adenopathy. Does not bruise/bleed easily.  Psychiatric/Behavioral: Positive for sleep disturbance (Occasional difficulty). Negative for depression. The patient is not nervous/anxious.     VITALS:  Blood pressure (!) 157/67, pulse 75, temperature 97.9 F (36.6 C), temperature source Oral, resp. rate 20, height 5\' 7"  (1.702 m), weight 126 lb 8 oz (57.4 kg), SpO2 98 %.  Wt Readings from Last 3 Encounters:  05/17/20 126 lb 8 oz (57.4 kg)  03/28/20 125 lb (56.7 kg)  02/28/20 125 lb (56.7 kg)    Body mass index is 19.81 kg/m.  Performance status (ECOG): 0 - Asymptomatic  PHYSICAL EXAM:  Physical Exam Vitals and nursing note reviewed.  Constitutional:      General: She is not in acute distress.    Appearance: Normal appearance.  HENT:     Head: Normocephalic and atraumatic.     Mouth/Throat:     Mouth: Mucous membranes are moist.     Pharynx: Oropharynx is clear. No oropharyngeal exudate or posterior oropharyngeal erythema.  Eyes:     General: No scleral icterus.    Extraocular  Movements: Extraocular movements intact.     Conjunctiva/sclera: Conjunctivae normal.     Pupils: Pupils are equal, round, and reactive to light.  Cardiovascular:     Rate and Rhythm: Normal rate and regular rhythm.     Heart sounds: Normal heart sounds. No murmur heard. No friction rub. No gallop.   Pulmonary:     Effort: Pulmonary effort is normal.     Breath sounds: Normal breath sounds. No wheezing, rhonchi or rales.  Chest:  Breasts:     Right: Normal. No swelling, bleeding, inverted nipple, mass, nipple discharge, skin change, tenderness, axillary adenopathy or supraclavicular adenopathy.     Left: Normal. No swelling, bleeding, inverted nipple, mass, nipple discharge, skin change, tenderness, axillary adenopathy or supraclavicular adenopathy.    Abdominal:     General: There is no distension.     Palpations: Abdomen is soft. There is no hepatomegaly, splenomegaly or mass.     Tenderness: There is no abdominal tenderness.  Musculoskeletal:        General: Normal range of motion.     Cervical back: Normal range of motion and neck supple. No tenderness.     Right lower leg: No edema.  Left lower leg: No edema.  Lymphadenopathy:     Cervical: No cervical adenopathy.     Upper Body:     Right upper body: No supraclavicular or axillary adenopathy.     Left upper body: No supraclavicular or axillary adenopathy.     Lower Body: No right inguinal adenopathy. No left inguinal adenopathy.  Skin:    General: Skin is warm and dry.     Coloration: Skin is not jaundiced.     Findings: No rash.  Neurological:     Mental Status: She is alert and oriented to person, place, and time.     Cranial Nerves: No cranial nerve deficit.  Psychiatric:        Mood and Affect: Mood normal.        Behavior: Behavior normal.        Thought Content: Thought content normal.    LABS:   CBC Latest Ref Rng & Units 05/17/2020 02/28/2020 01/02/2020  WBC - 7.8 10.1 9.5  Hemoglobin 12.0 - 16.0 11.5(A)  11.0(L) 10.5(L)  Hematocrit 36 - 46 36 35.6(L) 32.5(L)  Platelets 150 - 399 272 322 325   CMP Latest Ref Rng & Units 05/17/2020 02/28/2020 01/02/2020  Glucose 70 - 99 mg/dL - 162(H) 93  BUN 4 - 21 51(A) 29(H) 40(H)  Creatinine 0.5 - 1.1 1.3(A) 1.18(H) 1.36(H)  Sodium 137 - 147 140 140 142  Potassium 3.4 - 5.3 4.3 4.8 5.4(H)  Chloride 99 - 108 104 102 99  CO2 13 - 22 28(A) 27 29  Calcium 8.7 - 10.7 9.9 9.4 10.2  Total Protein 6.5 - 8.1 g/dL - - -  Total Bilirubin 0.3 - 1.2 mg/dL - - -  Alkaline Phos 25 - 125 55 - -  AST 13 - 35 29 - -  ALT 7 - 35 15 - -     No results found for: CEA1 / No results found for: CEA1 No results found for: PSA1 No results found for: LNL892 No results found for: JJH417  No results found for: TOTALPROTELP, ALBUMINELP, A1GS, A2GS, BETS, BETA2SER, GAMS, MSPIKE, SPEI No results found for: TIBC, FERRITIN, IRONPCTSAT No results found for: LDH  STUDIES:  No results found.    HISTORY:   Past Medical History:  Diagnosis Date  . Age-related osteoporosis without current pathological fracture 10/29/2019  . Atherosclerotic heart disease of native coronary artery without angina pectoris 10/02/2014  . Breast cancer ([place])   . CAD (coronary artery disease) hx of cabg  . CHF (congestive heart failure), NYHA class III, acute, systolic ([place]) 05/11/1446  . Chronic systolic heart failure ([place])   . Drug-induced skin rash 03/28/2016  . Dysphagia as late effect of cerebrovascular accident (CVA) 04/02/2016  . Dyspnea   . Elevated troponin   . Embolic stroke involving right middle cerebral artery ([place]) 03/25/2016  . Essential (primary) hypertension 10/02/2014  . History of mitral valve repair 2008  . Hx of CABG 2008  . Hyperlipidemia with target LDL less than 70   . Hypertension   . Ischemic dilated cardiomyopathy ([place]) 03/15/2017  . Long-term use of aspirin therapy 07/20/2017  . Malignant neoplasm of upper-outer quadrant of right female breast ([place])   . Mixed  hyperlipidemia 05/28/2017  . Non-rheumatic mitral regurgitation 03/15/2017  . Nonrheumatic aortic valve insufficiency 03/15/2017  . Old MI (myocardial infarction) 05/28/2017  . Other specified postprocedural states 10/02/2014  . Palpitations 05/28/2017  . Protein-calorie malnutrition, severe 11/23/2019  . PVD (peripheral vascular disease) ([place])   .  S/P CABG (coronary artery bypass graft) 03/15/2017  . Senile osteoporosis   . Statin intolerance 03/15/2017    Past Surgical History:  Procedure Laterality Date  . BREAST LUMPECTOMY    . IR FEM POP ART ATHERECT INC PTA MOD SED  02/28/2020  . IR ILIAC ART STENT INC PTA MOD SED  02/28/2020  . IR US GUIDE VASC ACCESS LEFT  02/28/2020  . MITRAL VALVE REPAIR (MV)/CORONARY ARTERY BYPASS GRAFTING (CABG)  2018  . RIGHT/LEFT HEART CATH AND CORONARY/GRAFT ANGIOGRAPHY N/A 11/17/2019   Procedure: RIGHT/LEFT HEART CATH AND CORONARY/GRAFT ANGIOGRAPHY;  Surgeon: Jettie Booze, MD;  Location: [facility];  Service: Cardiovascular;  Laterality: N/A;    Family History  Problem Relation Age of Onset  . Hypertension Father     Social History:  reports that she quit smoking about 14 years ago. She has never used smokeless tobacco. She reports previous alcohol use. She reports that she does not use drugs.The patient is accompanied by her daughter today.  Allergies:  Allergies  Allergen Reactions  . Iodinated Diagnostic Agents Rash  . Ticagrelor Rash  . Statins Other (See Comments)    myalgias     Current Medications: Current Outpatient Medications  Medication Sig Dispense Refill  . aspirin EC 81 MG EC tablet Take 1 tablet (81 mg total) by mouth daily. Swallow whole. 30 tablet 11  . clopidogrel (PLAVIX) 75 MG tablet Take 75 mg by mouth daily.    . furosemide (LASIX) 20 MG tablet Take 1 tablet by mouth once daily (Patient taking differently: Take 20 mg by mouth daily.) 90 tablet 3  . levothyroxine (SYNTHROID) 50 MCG tablet Take 50 mcg by mouth  daily before breakfast.    . MAGNESIUM CITRATE PO Take 400 mg by mouth in the morning, at noon, and at bedtime.    . metoprolol tartrate (LOPRESSOR) 25 MG tablet Take 25 mg by mouth 2 (two) times daily. Pt takes a total of 75 mg daily    . metoprolol tartrate (LOPRESSOR) 50 MG tablet Take 50 mg by mouth 2 (two) times daily. Pt takes a total of 75 mg daily    . nitroGLYCERIN (NITROSTAT) 0.4 MG SL tablet Place 0.4 mg under the tongue every 5 (five) minutes as needed for chest pain.    Marland Kitchen ondansetron (ZOFRAN ODT) 4 MG disintegrating tablet Take 1 tablet (4 mg total) by mouth every 8 (eight) hours as needed for nausea or vomiting. 20 tablet 0  . pantoprazole (PROTONIX) 40 MG tablet Take 40 mg by mouth daily.    . promethazine (PHENERGAN) 12.5 MG tablet Take 12.5 mg by mouth 4 (four) times daily as needed for nausea/vomiting.    . Red Yeast Rice 600 MG CAPS Take 600 mg by mouth in the morning and at bedtime.    Marland Kitchen spironolactone (ALDACTONE) 25 MG tablet Take 0.5 tablets (12.5 mg total) by mouth daily. 30 tablet 0  . traMADol (ULTRAM) 50 MG tablet Take 50 mg by mouth every 6 (six) hours as needed for severe pain.     No current facility-administered medications for this visit.     ASSESSMENT & PLAN:   Assessment:  1. Stage IIB hormone receptor positive right breast cancer treated with lumpectomy and adjuvant hormonal therapy for about 3 years.  She remains without evidence of recurrence.   She will be due for bilateral diagnostic mammogram in August. 2. Atypical ductal hyperplasia of the left breast, status post lumpectomy. 3. Worsening osteoporosis, despite alendronate.  She is now on Prolia every 6 months. Calcium and vitamin D have been discontinued due to significant renal lithiasis.   4. Chronic kidney disease, which is stable.   5. Bilateral renal lithiasis, she has not currently having symptoms. 6. Hypoglycemia, with glucose 42. The patient was given a soda and cereal bar.  Repeat glucose was  128.  I advised them follow-up with Dr. Delena Bali next week.  In the meantime, I recommended snacks in between meals and at bedtime.  Plan:  She will proceed with Prolia on April 18th.  I will schedule her mammogram in August. They will see Dr. Delena Bali regarding the hypoglycemia next week. We will plan to see her back in 6 months with a CBC and comprehensive metabolic panel prior to her next Prolia. The patient understands the plans discussed today and is in agreement with them.  She knows to contact our office if she develops concerns prior to her next appointment.    Marvia Pickles, PA-C

## 2020-05-20 ENCOUNTER — Inpatient Hospital Stay: Payer: Medicare Other

## 2020-05-20 ENCOUNTER — Other Ambulatory Visit: Payer: Self-pay

## 2020-05-20 VITALS — BP 144/63 | HR 74 | Temp 98.2°F | Resp 18 | Ht 67.0 in | Wt 124.0 lb

## 2020-05-20 DIAGNOSIS — M810 Age-related osteoporosis without current pathological fracture: Secondary | ICD-10-CM

## 2020-05-20 DIAGNOSIS — Z853 Personal history of malignant neoplasm of breast: Secondary | ICD-10-CM | POA: Diagnosis not present

## 2020-05-20 MED ORDER — DENOSUMAB 60 MG/ML ~~LOC~~ SOSY
PREFILLED_SYRINGE | SUBCUTANEOUS | Status: AC
  Filled 2020-05-20: qty 1

## 2020-05-20 MED ORDER — DENOSUMAB 60 MG/ML ~~LOC~~ SOSY
60.0000 mg | PREFILLED_SYRINGE | Freq: Once | SUBCUTANEOUS | Status: AC
  Administered 2020-05-20: 60 mg via SUBCUTANEOUS

## 2020-05-20 NOTE — Patient Instructions (Signed)
Denosumab injection What is this medicine? DENOSUMAB (den oh sue mab) slows bone breakdown. Prolia is used to treat osteoporosis in women after menopause and in men, and in people who are taking corticosteroids for 6 months or more. Xgeva is used to treat a high calcium level due to cancer and to prevent bone fractures and other bone problems caused by multiple myeloma or cancer bone metastases. Xgeva is also used to treat giant cell tumor of the bone. This medicine may be used for other purposes; ask your health care provider or pharmacist if you have questions. COMMON BRAND NAME(S): Prolia, XGEVA What should I tell my health care provider before I take this medicine? They need to know if you have any of these conditions:  dental disease  having surgery or tooth extraction  infection  kidney disease  low levels of calcium or Vitamin D in the blood  malnutrition  on hemodialysis  skin conditions or sensitivity  thyroid or parathyroid disease  an unusual reaction to denosumab, other medicines, foods, dyes, or preservatives  pregnant or trying to get pregnant  breast-feeding How should I use this medicine? This medicine is for injection under the skin. It is given by a health care professional in a hospital or clinic setting. A special MedGuide will be given to you before each treatment. Be sure to read this information carefully each time. For Prolia, talk to your pediatrician regarding the use of this medicine in children. Special care may be needed. For Xgeva, talk to your pediatrician regarding the use of this medicine in children. While this drug may be prescribed for children as young as 13 years for selected conditions, precautions do apply. Overdosage: If you think you have taken too much of this medicine contact a poison control center or emergency room at once. NOTE: This medicine is only for you. Do not share this medicine with others. What if I miss a dose? It is  important not to miss your dose. Call your doctor or health care professional if you are unable to keep an appointment. What may interact with this medicine? Do not take this medicine with any of the following medications:  other medicines containing denosumab This medicine may also interact with the following medications:  medicines that lower your chance of fighting infection  steroid medicines like prednisone or cortisone This list may not describe all possible interactions. Give your health care provider a list of all the medicines, herbs, non-prescription drugs, or dietary supplements you use. Also tell them if you smoke, drink alcohol, or use illegal drugs. Some items may interact with your medicine. What should I watch for while using this medicine? Visit your doctor or health care professional for regular checks on your progress. Your doctor or health care professional may order blood tests and other tests to see how you are doing. Call your doctor or health care professional for advice if you get a fever, chills or sore throat, or other symptoms of a cold or flu. Do not treat yourself. This drug may decrease your body's ability to fight infection. Try to avoid being around people who are sick. You should make sure you get enough calcium and vitamin D while you are taking this medicine, unless your doctor tells you not to. Discuss the foods you eat and the vitamins you take with your health care professional. See your dentist regularly. Brush and floss your teeth as directed. Before you have any dental work done, tell your dentist you are   receiving this medicine. Do not become pregnant while taking this medicine or for 5 months after stopping it. Talk with your doctor or health care professional about your birth control options while taking this medicine. Women should inform their doctor if they wish to become pregnant or think they might be pregnant. There is a potential for serious side  effects to an unborn child. Talk to your health care professional or pharmacist for more information. What side effects may I notice from receiving this medicine? Side effects that you should report to your doctor or health care professional as soon as possible:  allergic reactions like skin rash, itching or hives, swelling of the face, lips, or tongue  bone pain  breathing problems  dizziness  jaw pain, especially after dental work  redness, blistering, peeling of the skin  signs and symptoms of infection like fever or chills; cough; sore throat; pain or trouble passing urine  signs of low calcium like fast heartbeat, muscle cramps or muscle pain; pain, tingling, numbness in the hands or feet; seizures  unusual bleeding or bruising  unusually weak or tired Side effects that usually do not require medical attention (report to your doctor or health care professional if they continue or are bothersome):  constipation  diarrhea  headache  joint pain  loss of appetite  muscle pain  runny nose  tiredness  upset stomach This list may not describe all possible side effects. Call your doctor for medical advice about side effects. You may report side effects to FDA at 1-800-FDA-1088. Where should I keep my medicine? This medicine is only given in a clinic, doctor's office, or other health care setting and will not be stored at home. NOTE: This sheet is a summary. It may not cover all possible information. If you have questions about this medicine, talk to your doctor, pharmacist, or health care provider.  2021 Elsevier/Gold Standard (2017-05-28 16:10:44)

## 2020-05-23 DIAGNOSIS — Z952 Presence of prosthetic heart valve: Secondary | ICD-10-CM | POA: Diagnosis not present

## 2020-05-23 DIAGNOSIS — R5382 Chronic fatigue, unspecified: Secondary | ICD-10-CM | POA: Diagnosis not present

## 2020-05-23 DIAGNOSIS — E785 Hyperlipidemia, unspecified: Secondary | ICD-10-CM | POA: Diagnosis not present

## 2020-05-23 DIAGNOSIS — Z951 Presence of aortocoronary bypass graft: Secondary | ICD-10-CM | POA: Diagnosis not present

## 2020-05-23 DIAGNOSIS — I272 Pulmonary hypertension, unspecified: Secondary | ICD-10-CM | POA: Diagnosis not present

## 2020-05-23 DIAGNOSIS — I255 Ischemic cardiomyopathy: Secondary | ICD-10-CM | POA: Diagnosis not present

## 2020-05-23 DIAGNOSIS — E43 Unspecified severe protein-calorie malnutrition: Secondary | ICD-10-CM | POA: Diagnosis not present

## 2020-05-23 DIAGNOSIS — I110 Hypertensive heart disease with heart failure: Secondary | ICD-10-CM | POA: Diagnosis not present

## 2020-05-23 DIAGNOSIS — I739 Peripheral vascular disease, unspecified: Secondary | ICD-10-CM | POA: Diagnosis not present

## 2020-05-23 DIAGNOSIS — I5032 Chronic diastolic (congestive) heart failure: Secondary | ICD-10-CM | POA: Diagnosis not present

## 2020-05-23 DIAGNOSIS — I350 Nonrheumatic aortic (valve) stenosis: Secondary | ICD-10-CM | POA: Diagnosis not present

## 2020-05-23 DIAGNOSIS — M1991 Primary osteoarthritis, unspecified site: Secondary | ICD-10-CM | POA: Diagnosis not present

## 2020-05-23 DIAGNOSIS — I2510 Atherosclerotic heart disease of native coronary artery without angina pectoris: Secondary | ICD-10-CM | POA: Diagnosis not present

## 2020-05-23 DIAGNOSIS — Z79899 Other long term (current) drug therapy: Secondary | ICD-10-CM | POA: Diagnosis not present

## 2020-05-23 DIAGNOSIS — C50411 Malignant neoplasm of upper-outer quadrant of right female breast: Secondary | ICD-10-CM | POA: Diagnosis not present

## 2020-05-23 DIAGNOSIS — Z7982 Long term (current) use of aspirin: Secondary | ICD-10-CM | POA: Diagnosis not present

## 2020-05-23 DIAGNOSIS — Z87891 Personal history of nicotine dependence: Secondary | ICD-10-CM | POA: Diagnosis not present

## 2020-05-23 DIAGNOSIS — Z7902 Long term (current) use of antithrombotics/antiplatelets: Secondary | ICD-10-CM | POA: Diagnosis not present

## 2020-05-23 DIAGNOSIS — I5023 Acute on chronic systolic (congestive) heart failure: Secondary | ICD-10-CM | POA: Diagnosis not present

## 2020-05-30 DIAGNOSIS — I110 Hypertensive heart disease with heart failure: Secondary | ICD-10-CM | POA: Diagnosis not present

## 2020-05-30 DIAGNOSIS — I350 Nonrheumatic aortic (valve) stenosis: Secondary | ICD-10-CM | POA: Diagnosis not present

## 2020-05-30 DIAGNOSIS — I5032 Chronic diastolic (congestive) heart failure: Secondary | ICD-10-CM | POA: Diagnosis not present

## 2020-05-30 DIAGNOSIS — Z951 Presence of aortocoronary bypass graft: Secondary | ICD-10-CM | POA: Diagnosis not present

## 2020-05-30 DIAGNOSIS — Z952 Presence of prosthetic heart valve: Secondary | ICD-10-CM | POA: Diagnosis not present

## 2020-05-30 DIAGNOSIS — E43 Unspecified severe protein-calorie malnutrition: Secondary | ICD-10-CM | POA: Diagnosis not present

## 2020-05-30 DIAGNOSIS — Z7902 Long term (current) use of antithrombotics/antiplatelets: Secondary | ICD-10-CM | POA: Diagnosis not present

## 2020-05-30 DIAGNOSIS — Z79899 Other long term (current) drug therapy: Secondary | ICD-10-CM | POA: Diagnosis not present

## 2020-05-30 DIAGNOSIS — I2510 Atherosclerotic heart disease of native coronary artery without angina pectoris: Secondary | ICD-10-CM | POA: Diagnosis not present

## 2020-05-30 DIAGNOSIS — E785 Hyperlipidemia, unspecified: Secondary | ICD-10-CM | POA: Diagnosis not present

## 2020-05-30 DIAGNOSIS — I739 Peripheral vascular disease, unspecified: Secondary | ICD-10-CM | POA: Diagnosis not present

## 2020-05-30 DIAGNOSIS — C50411 Malignant neoplasm of upper-outer quadrant of right female breast: Secondary | ICD-10-CM | POA: Diagnosis not present

## 2020-05-30 DIAGNOSIS — M1991 Primary osteoarthritis, unspecified site: Secondary | ICD-10-CM | POA: Diagnosis not present

## 2020-05-30 DIAGNOSIS — R5382 Chronic fatigue, unspecified: Secondary | ICD-10-CM | POA: Diagnosis not present

## 2020-05-30 DIAGNOSIS — Z87891 Personal history of nicotine dependence: Secondary | ICD-10-CM | POA: Diagnosis not present

## 2020-05-30 DIAGNOSIS — I5023 Acute on chronic systolic (congestive) heart failure: Secondary | ICD-10-CM | POA: Diagnosis not present

## 2020-05-30 DIAGNOSIS — Z7982 Long term (current) use of aspirin: Secondary | ICD-10-CM | POA: Diagnosis not present

## 2020-05-30 DIAGNOSIS — I255 Ischemic cardiomyopathy: Secondary | ICD-10-CM | POA: Diagnosis not present

## 2020-05-30 DIAGNOSIS — I272 Pulmonary hypertension, unspecified: Secondary | ICD-10-CM | POA: Diagnosis not present

## 2020-05-31 ENCOUNTER — Other Ambulatory Visit: Payer: Self-pay

## 2020-05-31 ENCOUNTER — Encounter: Payer: Self-pay | Admitting: Sports Medicine

## 2020-05-31 ENCOUNTER — Ambulatory Visit: Payer: Medicare Other | Admitting: Sports Medicine

## 2020-05-31 DIAGNOSIS — M79674 Pain in right toe(s): Secondary | ICD-10-CM

## 2020-05-31 DIAGNOSIS — I739 Peripheral vascular disease, unspecified: Secondary | ICD-10-CM

## 2020-05-31 DIAGNOSIS — R234 Changes in skin texture: Secondary | ICD-10-CM | POA: Diagnosis not present

## 2020-05-31 NOTE — Progress Notes (Signed)
Subjective: Olivia [name] is a 87 y.o. female patient who returns to the office for follow-up wound care on right.  Patient is assisted by son who reports that she is doing good with no problem and his sister is continuing to help apply the Medihoney to the great toe.  No other pedal complaints noted.  Patient Active Problem List   Diagnosis Date Noted  . Breast cancer ([place])   . CAD (coronary artery disease)   . Chronic systolic heart failure ([place])   . Hyperlipidemia with target LDL less than 70   . Hypertension   . Malignant neoplasm of upper-outer quadrant of right female breast ([place])   . PVD (peripheral vascular disease) ([place])   . Senile osteoporosis   . Dyspnea   . Protein-calorie malnutrition, severe 11/23/2019  . Elevated troponin   . Age-related osteoporosis without current pathological fracture 10/29/2019  . Long-term use of aspirin therapy 07/20/2017  . Mixed hyperlipidemia 05/28/2017  . Old MI (myocardial infarction) 05/28/2017  . Palpitations 05/28/2017  . CHF (congestive heart failure), NYHA class III, acute, systolic ([place]) 32/20/2542  . Ischemic dilated cardiomyopathy ([place]) 03/15/2017  . Nonrheumatic aortic valve insufficiency 03/15/2017  . Non-rheumatic mitral regurgitation 03/15/2017  . S/P CABG (coronary artery bypass graft) 03/15/2017  . Statin intolerance 03/15/2017  . Dysphagia as late effect of cerebrovascular accident (CVA) 04/02/2016  . Drug-induced skin rash 03/28/2016  . Embolic stroke involving right middle cerebral artery ([place]) 03/25/2016  . Atherosclerotic heart disease of native coronary artery without angina pectoris 10/02/2014  . Essential (primary) hypertension 10/02/2014  . Other specified postprocedural states 10/02/2014  . History of mitral valve repair 2008  . Hx of CABG 2008    Current Outpatient Medications on File Prior to Visit  Medication Sig Dispense Refill  . aspirin EC 81 MG EC tablet Take 1 tablet (81 mg total) by mouth daily.  Swallow whole. 30 tablet 11  . clopidogrel (PLAVIX) 75 MG tablet Take 75 mg by mouth daily.    . furosemide (LASIX) 20 MG tablet Take 1 tablet by mouth once daily (Patient taking differently: Take 20 mg by mouth daily.) 90 tablet 3  . levothyroxine (SYNTHROID) 50 MCG tablet Take 50 mcg by mouth daily before breakfast.    . MAGNESIUM CITRATE PO Take 400 mg by mouth in the morning, at noon, and at bedtime.    . metoprolol tartrate (LOPRESSOR) 25 MG tablet Take 25 mg by mouth 2 (two) times daily. Pt takes a total of 75 mg daily    . metoprolol tartrate (LOPRESSOR) 50 MG tablet Take 50 mg by mouth 2 (two) times daily. Pt takes a total of 75 mg daily    . nitroGLYCERIN (NITROSTAT) 0.4 MG SL tablet Place 0.4 mg under the tongue every 5 (five) minutes as needed for chest pain.    Marland Kitchen ondansetron (ZOFRAN ODT) 4 MG disintegrating tablet Take 1 tablet (4 mg total) by mouth every 8 (eight) hours as needed for nausea or vomiting. 20 tablet 0  . pantoprazole (PROTONIX) 40 MG tablet Take 40 mg by mouth daily.    . promethazine (PHENERGAN) 12.5 MG tablet Take 12.5 mg by mouth 4 (four) times daily as needed for nausea/vomiting.    . Red Yeast Rice 600 MG CAPS Take 600 mg by mouth in the morning and at bedtime.    Marland Kitchen spironolactone (ALDACTONE) 25 MG tablet Take 0.5 tablets (12.5 mg total) by mouth daily. 30 tablet 0  . traMADol (ULTRAM) 50 MG  tablet Take 50 mg by mouth every 6 (six) hours as needed for severe pain.     No current facility-administered medications on file prior to visit.    Allergies  Allergen Reactions  . Iodinated Diagnostic Agents Rash  . Ticagrelor Rash  . Statins Other (See Comments)    myalgias     Objective:  General: Alert and oriented x3 in no acute distress  Dermatology:  There is a dry eschar noted to the distal tuft of the right hallux that appears to be getting smaller and slowly improving, resolved erythema, resolved edema. Preulcerative callus noted to right heel noted signs  of infection.  Vascular: Dorsalis Pedis and Posterior Tibial pedal pulses nonpalpable however capillary fill time seems to be slightly improved at 3 seconds, (+) less dependent rubor that improves with elevation, scant pedal hair growth bilateral, no significant edema bilateral lower extremities however per patient sometimes the right foot swells, Temperature gradient decreased bilateral.  Neurology: Gross sensation intact via light touch bilateral.  Musculoskeletal: Mild tenderness with palpation right great toe.  There is significant bunion and hammertoe deformity noted.  Pes planus deformity noted.   Assessment and Plan: Problem List Items Addressed This Visit   None   Visit Diagnoses    Eschar of toe    -  Primary   PAD (peripheral artery disease) (HCC)       Toe pain, right         -Complete examination performed -Re-Discussed treatment options eschar to toe and PAD and preulcerative nature of skin changes at heel on right -Cleansed wound on right great toe and applied Medihoney to first toe and clean sock and advised daughter to dress using the same once every other day -Dispensed offloading pads for her heels -Continue with shoes that do not rub her toe -Continue with vascular follow-up -Patient to return to office in 4-6 weeks for re-check of toe.  Landis Martins, DPM

## 2020-06-04 DIAGNOSIS — B9689 Other specified bacterial agents as the cause of diseases classified elsewhere: Secondary | ICD-10-CM | POA: Diagnosis not present

## 2020-06-04 DIAGNOSIS — J019 Acute sinusitis, unspecified: Secondary | ICD-10-CM | POA: Diagnosis not present

## 2020-06-06 DIAGNOSIS — I2510 Atherosclerotic heart disease of native coronary artery without angina pectoris: Secondary | ICD-10-CM | POA: Diagnosis not present

## 2020-06-06 DIAGNOSIS — I5032 Chronic diastolic (congestive) heart failure: Secondary | ICD-10-CM | POA: Diagnosis not present

## 2020-06-06 DIAGNOSIS — I110 Hypertensive heart disease with heart failure: Secondary | ICD-10-CM | POA: Diagnosis not present

## 2020-06-06 DIAGNOSIS — I272 Pulmonary hypertension, unspecified: Secondary | ICD-10-CM | POA: Diagnosis not present

## 2020-06-06 DIAGNOSIS — Z7902 Long term (current) use of antithrombotics/antiplatelets: Secondary | ICD-10-CM | POA: Diagnosis not present

## 2020-06-06 DIAGNOSIS — Z79899 Other long term (current) drug therapy: Secondary | ICD-10-CM | POA: Diagnosis not present

## 2020-06-06 DIAGNOSIS — C50411 Malignant neoplasm of upper-outer quadrant of right female breast: Secondary | ICD-10-CM | POA: Diagnosis not present

## 2020-06-06 DIAGNOSIS — I509 Heart failure, unspecified: Secondary | ICD-10-CM | POA: Diagnosis not present

## 2020-06-06 DIAGNOSIS — R5382 Chronic fatigue, unspecified: Secondary | ICD-10-CM | POA: Diagnosis not present

## 2020-06-06 DIAGNOSIS — Z951 Presence of aortocoronary bypass graft: Secondary | ICD-10-CM | POA: Diagnosis not present

## 2020-06-06 DIAGNOSIS — E43 Unspecified severe protein-calorie malnutrition: Secondary | ICD-10-CM | POA: Diagnosis not present

## 2020-06-06 DIAGNOSIS — I5023 Acute on chronic systolic (congestive) heart failure: Secondary | ICD-10-CM | POA: Diagnosis not present

## 2020-06-06 DIAGNOSIS — I350 Nonrheumatic aortic (valve) stenosis: Secondary | ICD-10-CM | POA: Diagnosis not present

## 2020-06-06 DIAGNOSIS — I255 Ischemic cardiomyopathy: Secondary | ICD-10-CM | POA: Diagnosis not present

## 2020-06-06 DIAGNOSIS — Z952 Presence of prosthetic heart valve: Secondary | ICD-10-CM | POA: Diagnosis not present

## 2020-06-06 DIAGNOSIS — E785 Hyperlipidemia, unspecified: Secondary | ICD-10-CM | POA: Diagnosis not present

## 2020-06-06 DIAGNOSIS — I739 Peripheral vascular disease, unspecified: Secondary | ICD-10-CM | POA: Diagnosis not present

## 2020-06-06 DIAGNOSIS — M1991 Primary osteoarthritis, unspecified site: Secondary | ICD-10-CM | POA: Diagnosis not present

## 2020-06-06 DIAGNOSIS — Z7982 Long term (current) use of aspirin: Secondary | ICD-10-CM | POA: Diagnosis not present

## 2020-06-06 DIAGNOSIS — Z87891 Personal history of nicotine dependence: Secondary | ICD-10-CM | POA: Diagnosis not present

## 2020-06-13 DIAGNOSIS — E785 Hyperlipidemia, unspecified: Secondary | ICD-10-CM | POA: Diagnosis not present

## 2020-06-13 DIAGNOSIS — Z7982 Long term (current) use of aspirin: Secondary | ICD-10-CM | POA: Diagnosis not present

## 2020-06-13 DIAGNOSIS — C50411 Malignant neoplasm of upper-outer quadrant of right female breast: Secondary | ICD-10-CM | POA: Diagnosis not present

## 2020-06-13 DIAGNOSIS — E43 Unspecified severe protein-calorie malnutrition: Secondary | ICD-10-CM | POA: Diagnosis not present

## 2020-06-13 DIAGNOSIS — I5032 Chronic diastolic (congestive) heart failure: Secondary | ICD-10-CM | POA: Diagnosis not present

## 2020-06-13 DIAGNOSIS — I110 Hypertensive heart disease with heart failure: Secondary | ICD-10-CM | POA: Diagnosis not present

## 2020-06-13 DIAGNOSIS — Z7902 Long term (current) use of antithrombotics/antiplatelets: Secondary | ICD-10-CM | POA: Diagnosis not present

## 2020-06-13 DIAGNOSIS — I5023 Acute on chronic systolic (congestive) heart failure: Secondary | ICD-10-CM | POA: Diagnosis not present

## 2020-06-13 DIAGNOSIS — I2510 Atherosclerotic heart disease of native coronary artery without angina pectoris: Secondary | ICD-10-CM | POA: Diagnosis not present

## 2020-06-13 DIAGNOSIS — Z87891 Personal history of nicotine dependence: Secondary | ICD-10-CM | POA: Diagnosis not present

## 2020-06-13 DIAGNOSIS — R5382 Chronic fatigue, unspecified: Secondary | ICD-10-CM | POA: Diagnosis not present

## 2020-06-13 DIAGNOSIS — I272 Pulmonary hypertension, unspecified: Secondary | ICD-10-CM | POA: Diagnosis not present

## 2020-06-13 DIAGNOSIS — Z951 Presence of aortocoronary bypass graft: Secondary | ICD-10-CM | POA: Diagnosis not present

## 2020-06-13 DIAGNOSIS — I255 Ischemic cardiomyopathy: Secondary | ICD-10-CM | POA: Diagnosis not present

## 2020-06-13 DIAGNOSIS — M1991 Primary osteoarthritis, unspecified site: Secondary | ICD-10-CM | POA: Diagnosis not present

## 2020-06-13 DIAGNOSIS — Z952 Presence of prosthetic heart valve: Secondary | ICD-10-CM | POA: Diagnosis not present

## 2020-06-13 DIAGNOSIS — I350 Nonrheumatic aortic (valve) stenosis: Secondary | ICD-10-CM | POA: Diagnosis not present

## 2020-06-13 DIAGNOSIS — I739 Peripheral vascular disease, unspecified: Secondary | ICD-10-CM | POA: Diagnosis not present

## 2020-06-13 DIAGNOSIS — Z79899 Other long term (current) drug therapy: Secondary | ICD-10-CM | POA: Diagnosis not present

## 2020-06-20 DIAGNOSIS — Z952 Presence of prosthetic heart valve: Secondary | ICD-10-CM | POA: Diagnosis not present

## 2020-06-20 DIAGNOSIS — I739 Peripheral vascular disease, unspecified: Secondary | ICD-10-CM | POA: Diagnosis not present

## 2020-06-20 DIAGNOSIS — I5023 Acute on chronic systolic (congestive) heart failure: Secondary | ICD-10-CM | POA: Diagnosis not present

## 2020-06-20 DIAGNOSIS — Z951 Presence of aortocoronary bypass graft: Secondary | ICD-10-CM | POA: Diagnosis not present

## 2020-06-20 DIAGNOSIS — R5382 Chronic fatigue, unspecified: Secondary | ICD-10-CM | POA: Diagnosis not present

## 2020-06-20 DIAGNOSIS — Z87891 Personal history of nicotine dependence: Secondary | ICD-10-CM | POA: Diagnosis not present

## 2020-06-20 DIAGNOSIS — I5032 Chronic diastolic (congestive) heart failure: Secondary | ICD-10-CM | POA: Diagnosis not present

## 2020-06-20 DIAGNOSIS — Z7982 Long term (current) use of aspirin: Secondary | ICD-10-CM | POA: Diagnosis not present

## 2020-06-20 DIAGNOSIS — C50411 Malignant neoplasm of upper-outer quadrant of right female breast: Secondary | ICD-10-CM | POA: Diagnosis not present

## 2020-06-20 DIAGNOSIS — Z7902 Long term (current) use of antithrombotics/antiplatelets: Secondary | ICD-10-CM | POA: Diagnosis not present

## 2020-06-20 DIAGNOSIS — M1991 Primary osteoarthritis, unspecified site: Secondary | ICD-10-CM | POA: Diagnosis not present

## 2020-06-20 DIAGNOSIS — I2510 Atherosclerotic heart disease of native coronary artery without angina pectoris: Secondary | ICD-10-CM | POA: Diagnosis not present

## 2020-06-20 DIAGNOSIS — I272 Pulmonary hypertension, unspecified: Secondary | ICD-10-CM | POA: Diagnosis not present

## 2020-06-20 DIAGNOSIS — E43 Unspecified severe protein-calorie malnutrition: Secondary | ICD-10-CM | POA: Diagnosis not present

## 2020-06-20 DIAGNOSIS — I255 Ischemic cardiomyopathy: Secondary | ICD-10-CM | POA: Diagnosis not present

## 2020-06-20 DIAGNOSIS — Z79899 Other long term (current) drug therapy: Secondary | ICD-10-CM | POA: Diagnosis not present

## 2020-06-20 DIAGNOSIS — I110 Hypertensive heart disease with heart failure: Secondary | ICD-10-CM | POA: Diagnosis not present

## 2020-06-20 DIAGNOSIS — E785 Hyperlipidemia, unspecified: Secondary | ICD-10-CM | POA: Diagnosis not present

## 2020-06-20 DIAGNOSIS — I350 Nonrheumatic aortic (valve) stenosis: Secondary | ICD-10-CM | POA: Diagnosis not present

## 2020-06-21 DIAGNOSIS — E785 Hyperlipidemia, unspecified: Secondary | ICD-10-CM | POA: Diagnosis not present

## 2020-06-21 DIAGNOSIS — I739 Peripheral vascular disease, unspecified: Secondary | ICD-10-CM | POA: Diagnosis not present

## 2020-06-21 DIAGNOSIS — Z7982 Long term (current) use of aspirin: Secondary | ICD-10-CM | POA: Diagnosis not present

## 2020-06-21 DIAGNOSIS — C50411 Malignant neoplasm of upper-outer quadrant of right female breast: Secondary | ICD-10-CM | POA: Diagnosis not present

## 2020-06-21 DIAGNOSIS — I350 Nonrheumatic aortic (valve) stenosis: Secondary | ICD-10-CM | POA: Diagnosis not present

## 2020-06-21 DIAGNOSIS — I110 Hypertensive heart disease with heart failure: Secondary | ICD-10-CM | POA: Diagnosis not present

## 2020-06-21 DIAGNOSIS — I272 Pulmonary hypertension, unspecified: Secondary | ICD-10-CM | POA: Diagnosis not present

## 2020-06-21 DIAGNOSIS — Z87891 Personal history of nicotine dependence: Secondary | ICD-10-CM | POA: Diagnosis not present

## 2020-06-21 DIAGNOSIS — I5032 Chronic diastolic (congestive) heart failure: Secondary | ICD-10-CM | POA: Diagnosis not present

## 2020-06-21 DIAGNOSIS — M1991 Primary osteoarthritis, unspecified site: Secondary | ICD-10-CM | POA: Diagnosis not present

## 2020-06-21 DIAGNOSIS — E43 Unspecified severe protein-calorie malnutrition: Secondary | ICD-10-CM | POA: Diagnosis not present

## 2020-06-21 DIAGNOSIS — I5023 Acute on chronic systolic (congestive) heart failure: Secondary | ICD-10-CM | POA: Diagnosis not present

## 2020-06-21 DIAGNOSIS — I2510 Atherosclerotic heart disease of native coronary artery without angina pectoris: Secondary | ICD-10-CM | POA: Diagnosis not present

## 2020-06-21 DIAGNOSIS — Z951 Presence of aortocoronary bypass graft: Secondary | ICD-10-CM | POA: Diagnosis not present

## 2020-06-21 DIAGNOSIS — Z79899 Other long term (current) drug therapy: Secondary | ICD-10-CM | POA: Diagnosis not present

## 2020-06-21 DIAGNOSIS — Z952 Presence of prosthetic heart valve: Secondary | ICD-10-CM | POA: Diagnosis not present

## 2020-06-21 DIAGNOSIS — I255 Ischemic cardiomyopathy: Secondary | ICD-10-CM | POA: Diagnosis not present

## 2020-06-21 DIAGNOSIS — Z7902 Long term (current) use of antithrombotics/antiplatelets: Secondary | ICD-10-CM | POA: Diagnosis not present

## 2020-06-21 DIAGNOSIS — R5382 Chronic fatigue, unspecified: Secondary | ICD-10-CM | POA: Diagnosis not present

## 2020-06-27 DIAGNOSIS — I5032 Chronic diastolic (congestive) heart failure: Secondary | ICD-10-CM | POA: Diagnosis not present

## 2020-06-27 DIAGNOSIS — I2510 Atherosclerotic heart disease of native coronary artery without angina pectoris: Secondary | ICD-10-CM | POA: Diagnosis not present

## 2020-06-27 DIAGNOSIS — Z87891 Personal history of nicotine dependence: Secondary | ICD-10-CM | POA: Diagnosis not present

## 2020-06-27 DIAGNOSIS — I272 Pulmonary hypertension, unspecified: Secondary | ICD-10-CM | POA: Diagnosis not present

## 2020-06-27 DIAGNOSIS — E785 Hyperlipidemia, unspecified: Secondary | ICD-10-CM | POA: Diagnosis not present

## 2020-06-27 DIAGNOSIS — Z951 Presence of aortocoronary bypass graft: Secondary | ICD-10-CM | POA: Diagnosis not present

## 2020-06-27 DIAGNOSIS — Z79899 Other long term (current) drug therapy: Secondary | ICD-10-CM | POA: Diagnosis not present

## 2020-06-27 DIAGNOSIS — E43 Unspecified severe protein-calorie malnutrition: Secondary | ICD-10-CM | POA: Diagnosis not present

## 2020-06-27 DIAGNOSIS — I5023 Acute on chronic systolic (congestive) heart failure: Secondary | ICD-10-CM | POA: Diagnosis not present

## 2020-06-27 DIAGNOSIS — I255 Ischemic cardiomyopathy: Secondary | ICD-10-CM | POA: Diagnosis not present

## 2020-06-27 DIAGNOSIS — Z952 Presence of prosthetic heart valve: Secondary | ICD-10-CM | POA: Diagnosis not present

## 2020-06-27 DIAGNOSIS — I110 Hypertensive heart disease with heart failure: Secondary | ICD-10-CM | POA: Diagnosis not present

## 2020-06-27 DIAGNOSIS — C50411 Malignant neoplasm of upper-outer quadrant of right female breast: Secondary | ICD-10-CM | POA: Diagnosis not present

## 2020-06-27 DIAGNOSIS — Z7982 Long term (current) use of aspirin: Secondary | ICD-10-CM | POA: Diagnosis not present

## 2020-06-27 DIAGNOSIS — R5382 Chronic fatigue, unspecified: Secondary | ICD-10-CM | POA: Diagnosis not present

## 2020-06-27 DIAGNOSIS — I350 Nonrheumatic aortic (valve) stenosis: Secondary | ICD-10-CM | POA: Diagnosis not present

## 2020-06-27 DIAGNOSIS — I739 Peripheral vascular disease, unspecified: Secondary | ICD-10-CM | POA: Diagnosis not present

## 2020-06-27 DIAGNOSIS — Z7902 Long term (current) use of antithrombotics/antiplatelets: Secondary | ICD-10-CM | POA: Diagnosis not present

## 2020-06-27 DIAGNOSIS — M1991 Primary osteoarthritis, unspecified site: Secondary | ICD-10-CM | POA: Diagnosis not present

## 2020-07-03 DIAGNOSIS — Z743 Need for continuous supervision: Secondary | ICD-10-CM | POA: Diagnosis not present

## 2020-07-03 DIAGNOSIS — R0602 Shortness of breath: Secondary | ICD-10-CM | POA: Diagnosis not present

## 2020-07-03 DIAGNOSIS — J90 Pleural effusion, not elsewhere classified: Secondary | ICD-10-CM | POA: Diagnosis not present

## 2020-07-03 DIAGNOSIS — R0689 Other abnormalities of breathing: Secondary | ICD-10-CM | POA: Diagnosis not present

## 2020-07-03 DIAGNOSIS — J9621 Acute and chronic respiratory failure with hypoxia: Secondary | ICD-10-CM | POA: Diagnosis not present

## 2020-07-03 DIAGNOSIS — Z87891 Personal history of nicotine dependence: Secondary | ICD-10-CM | POA: Diagnosis not present

## 2020-07-03 DIAGNOSIS — I252 Old myocardial infarction: Secondary | ICD-10-CM | POA: Diagnosis not present

## 2020-07-03 DIAGNOSIS — Z7982 Long term (current) use of aspirin: Secondary | ICD-10-CM | POA: Diagnosis not present

## 2020-07-03 DIAGNOSIS — E039 Hypothyroidism, unspecified: Secondary | ICD-10-CM | POA: Diagnosis not present

## 2020-07-03 DIAGNOSIS — I499 Cardiac arrhythmia, unspecified: Secondary | ICD-10-CM | POA: Diagnosis not present

## 2020-07-03 DIAGNOSIS — Z7902 Long term (current) use of antithrombotics/antiplatelets: Secondary | ICD-10-CM | POA: Diagnosis not present

## 2020-07-03 DIAGNOSIS — Z79899 Other long term (current) drug therapy: Secondary | ICD-10-CM | POA: Diagnosis not present

## 2020-07-03 DIAGNOSIS — R6889 Other general symptoms and signs: Secondary | ICD-10-CM | POA: Diagnosis not present

## 2020-07-03 DIAGNOSIS — I517 Cardiomegaly: Secondary | ICD-10-CM | POA: Diagnosis not present

## 2020-07-03 DIAGNOSIS — Z8673 Personal history of transient ischemic attack (TIA), and cerebral infarction without residual deficits: Secondary | ICD-10-CM | POA: Diagnosis not present

## 2020-07-03 DIAGNOSIS — Z955 Presence of coronary angioplasty implant and graft: Secondary | ICD-10-CM | POA: Diagnosis not present

## 2020-07-03 DIAGNOSIS — I5023 Acute on chronic systolic (congestive) heart failure: Secondary | ICD-10-CM | POA: Diagnosis not present

## 2020-07-03 DIAGNOSIS — Z9181 History of falling: Secondary | ICD-10-CM | POA: Diagnosis not present

## 2020-07-03 DIAGNOSIS — I509 Heart failure, unspecified: Secondary | ICD-10-CM | POA: Diagnosis not present

## 2020-07-03 DIAGNOSIS — I2510 Atherosclerotic heart disease of native coronary artery without angina pectoris: Secondary | ICD-10-CM | POA: Diagnosis not present

## 2020-07-03 DIAGNOSIS — M1990 Unspecified osteoarthritis, unspecified site: Secondary | ICD-10-CM | POA: Diagnosis not present

## 2020-07-03 DIAGNOSIS — I110 Hypertensive heart disease with heart failure: Secondary | ICD-10-CM | POA: Diagnosis not present

## 2020-07-05 ENCOUNTER — Ambulatory Visit: Payer: Medicare Other | Admitting: Sports Medicine

## 2020-07-06 DIAGNOSIS — Z8673 Personal history of transient ischemic attack (TIA), and cerebral infarction without residual deficits: Secondary | ICD-10-CM | POA: Diagnosis not present

## 2020-07-06 DIAGNOSIS — Z7902 Long term (current) use of antithrombotics/antiplatelets: Secondary | ICD-10-CM | POA: Diagnosis not present

## 2020-07-06 DIAGNOSIS — I2510 Atherosclerotic heart disease of native coronary artery without angina pectoris: Secondary | ICD-10-CM | POA: Diagnosis not present

## 2020-07-06 DIAGNOSIS — C50411 Malignant neoplasm of upper-outer quadrant of right female breast: Secondary | ICD-10-CM | POA: Diagnosis not present

## 2020-07-06 DIAGNOSIS — Z7951 Long term (current) use of inhaled steroids: Secondary | ICD-10-CM | POA: Diagnosis not present

## 2020-07-06 DIAGNOSIS — I5032 Chronic diastolic (congestive) heart failure: Secondary | ICD-10-CM | POA: Diagnosis not present

## 2020-07-06 DIAGNOSIS — I5023 Acute on chronic systolic (congestive) heart failure: Secondary | ICD-10-CM | POA: Diagnosis not present

## 2020-07-06 DIAGNOSIS — Z9981 Dependence on supplemental oxygen: Secondary | ICD-10-CM | POA: Diagnosis not present

## 2020-07-06 DIAGNOSIS — J9621 Acute and chronic respiratory failure with hypoxia: Secondary | ICD-10-CM | POA: Diagnosis not present

## 2020-07-06 DIAGNOSIS — I110 Hypertensive heart disease with heart failure: Secondary | ICD-10-CM | POA: Diagnosis not present

## 2020-07-06 DIAGNOSIS — Z7982 Long term (current) use of aspirin: Secondary | ICD-10-CM | POA: Diagnosis not present

## 2020-07-07 DIAGNOSIS — I509 Heart failure, unspecified: Secondary | ICD-10-CM | POA: Diagnosis not present

## 2020-07-09 DIAGNOSIS — J9601 Acute respiratory failure with hypoxia: Secondary | ICD-10-CM | POA: Diagnosis not present

## 2020-07-09 DIAGNOSIS — I5023 Acute on chronic systolic (congestive) heart failure: Secondary | ICD-10-CM | POA: Diagnosis not present

## 2020-07-09 DIAGNOSIS — I2510 Atherosclerotic heart disease of native coronary artery without angina pectoris: Secondary | ICD-10-CM | POA: Diagnosis not present

## 2020-07-09 DIAGNOSIS — Z955 Presence of coronary angioplasty implant and graft: Secondary | ICD-10-CM | POA: Diagnosis not present

## 2020-07-10 DIAGNOSIS — I2510 Atherosclerotic heart disease of native coronary artery without angina pectoris: Secondary | ICD-10-CM | POA: Diagnosis not present

## 2020-07-10 DIAGNOSIS — Z9981 Dependence on supplemental oxygen: Secondary | ICD-10-CM | POA: Diagnosis not present

## 2020-07-10 DIAGNOSIS — I110 Hypertensive heart disease with heart failure: Secondary | ICD-10-CM | POA: Diagnosis not present

## 2020-07-10 DIAGNOSIS — I5023 Acute on chronic systolic (congestive) heart failure: Secondary | ICD-10-CM | POA: Diagnosis not present

## 2020-07-10 DIAGNOSIS — Z7951 Long term (current) use of inhaled steroids: Secondary | ICD-10-CM | POA: Diagnosis not present

## 2020-07-10 DIAGNOSIS — Z7902 Long term (current) use of antithrombotics/antiplatelets: Secondary | ICD-10-CM | POA: Diagnosis not present

## 2020-07-10 DIAGNOSIS — Z8673 Personal history of transient ischemic attack (TIA), and cerebral infarction without residual deficits: Secondary | ICD-10-CM | POA: Diagnosis not present

## 2020-07-10 DIAGNOSIS — C50411 Malignant neoplasm of upper-outer quadrant of right female breast: Secondary | ICD-10-CM | POA: Diagnosis not present

## 2020-07-10 DIAGNOSIS — J9621 Acute and chronic respiratory failure with hypoxia: Secondary | ICD-10-CM | POA: Diagnosis not present

## 2020-07-10 DIAGNOSIS — Z7982 Long term (current) use of aspirin: Secondary | ICD-10-CM | POA: Diagnosis not present

## 2020-07-10 DIAGNOSIS — I5032 Chronic diastolic (congestive) heart failure: Secondary | ICD-10-CM | POA: Diagnosis not present

## 2020-07-16 ENCOUNTER — Other Ambulatory Visit: Payer: Self-pay

## 2020-07-16 ENCOUNTER — Ambulatory Visit: Payer: Medicare Other | Admitting: Sports Medicine

## 2020-07-16 ENCOUNTER — Encounter: Payer: Self-pay | Admitting: Sports Medicine

## 2020-07-16 DIAGNOSIS — M79674 Pain in right toe(s): Secondary | ICD-10-CM | POA: Diagnosis not present

## 2020-07-16 DIAGNOSIS — R234 Changes in skin texture: Secondary | ICD-10-CM

## 2020-07-16 DIAGNOSIS — I739 Peripheral vascular disease, unspecified: Secondary | ICD-10-CM

## 2020-07-16 NOTE — Progress Notes (Signed)
Subjective: Olivia [name] is a 87 y.o. female patient who returns to the office for follow-up wound care on right.  Patient is assisted by son who reports that she is doing good, healing up good with big improvments and sister is applying the Medihoney to the great toe. Reports a little pain. No other pedal complaints noted.  Patient Active Problem List   Diagnosis Date Noted   Breast cancer [facility])    CAD (coronary artery disease)    Chronic systolic heart failure ([place])    Hyperlipidemia with target LDL less than 70    Hypertension    Malignant neoplasm of upper-outer quadrant of right female breast ([place])    PVD (peripheral vascular disease) ([place])    Senile osteoporosis    Dyspnea    Protein-calorie malnutrition, severe 11/23/2019   Elevated troponin    Age-related osteoporosis without current pathological fracture 10/29/2019   Long-term use of aspirin therapy 07/20/2017   Mixed hyperlipidemia 05/28/2017   Old MI (myocardial infarction) 05/28/2017   Palpitations 05/28/2017   CHF (congestive heart failure), NYHA class III, acute, systolic ([place]) 88/50/2774   Ischemic dilated cardiomyopathy ([place]) 03/15/2017   Nonrheumatic aortic valve insufficiency 03/15/2017   Non-rheumatic mitral regurgitation 03/15/2017   S/P CABG (coronary artery bypass graft) 03/15/2017   Statin intolerance 03/15/2017   Dysphagia as late effect of cerebrovascular accident (CVA) 04/02/2016   Drug-induced skin rash 12/87/8676   Embolic stroke involving right middle cerebral artery ([place]) 03/25/2016   Atherosclerotic heart disease of native coronary artery without angina pectoris 10/02/2014   Essential (primary) hypertension 10/02/2014   Other specified postprocedural states 10/02/2014   History of mitral valve repair 2008   Hx of CABG 2008    Current Outpatient Medications on File Prior to Visit  Medication Sig Dispense Refill   aspirin EC 81 MG EC tablet Take 1 tablet (81 mg total) by mouth daily. Swallow  whole. 30 tablet 11   clopidogrel (PLAVIX) 75 MG tablet Take 75 mg by mouth daily.     furosemide (LASIX) 20 MG tablet Take 1 tablet by mouth once daily (Patient taking differently: Take 20 mg by mouth daily.) 90 tablet 3   levothyroxine (SYNTHROID) 50 MCG tablet Take 50 mcg by mouth daily before breakfast.     MAGNESIUM CITRATE PO Take 400 mg by mouth in the morning, at noon, and at bedtime.     metoprolol tartrate (LOPRESSOR) 25 MG tablet Take 25 mg by mouth 2 (two) times daily. Pt takes a total of 75 mg daily     metoprolol tartrate (LOPRESSOR) 50 MG tablet Take 50 mg by mouth 2 (two) times daily. Pt takes a total of 75 mg daily     nitroGLYCERIN (NITROSTAT) 0.4 MG SL tablet Place 0.4 mg under the tongue every 5 (five) minutes as needed for chest pain.     ondansetron (ZOFRAN ODT) 4 MG disintegrating tablet Take 1 tablet (4 mg total) by mouth every 8 (eight) hours as needed for nausea or vomiting. 20 tablet 0   pantoprazole (PROTONIX) 40 MG tablet Take 40 mg by mouth daily.     promethazine (PHENERGAN) 12.5 MG tablet Take 12.5 mg by mouth 4 (four) times daily as needed for nausea/vomiting.     Red Yeast Rice 600 MG CAPS Take 600 mg by mouth in the morning and at bedtime.     spironolactone (ALDACTONE) 25 MG tablet Take 0.5 tablets (12.5 mg total) by mouth daily. 30 tablet 0   traMADol (ULTRAM)  50 MG tablet Take 50 mg by mouth every 6 (six) hours as needed for severe pain.     No current facility-administered medications on file prior to visit.    Allergies  Allergen Reactions   Iodinated Diagnostic Agents Rash   Ticagrelor Rash   Statins Other (See Comments)    myalgias     Objective:  General: Alert and oriented x3 in no acute distress  Dermatology:  There is a dry eschar noted to the distal tuft of the right hallux that appears to be getting smaller and slowly improving, no erythema, no edema. Preulcerative callus noted to right heel noted signs of infection.  Vascular: Dorsalis  Pedis and Posterior Tibial pedal pulses nonpalpable however capillary fill time seems to be slightly improved at 3 seconds, (+) less dependent rubor that improves with elevation, scant pedal hair growth bilateral, no significant edema bilateral lower extremities however per patient sometimes the right foot swells, Temperature gradient decreased bilateral.  Neurology: Gross sensation intact via light touch bilateral.  Musculoskeletal: Mild tenderness with palpation right great toe.  There is significant bunion and hammertoe deformity noted.  Pes planus deformity noted.   Assessment and Plan: Problem List Items Addressed This Visit   None Visit Diagnoses     Eschar of toe    -  Primary   PAD (peripheral artery disease) (HCC)       Toe pain, right          -Complete examination performed -Re-Discussed treatment options eschar to toe and PAD and preulcerative nature of skin changes at heel on right -Cleansed wound on right great toe and applied Medihoney to first toe and clean sock and advised daughter to dress using the same once every other day like before and made them aware that the eschar cap may slowly fall off -Dispensed offloading pads for her heels to use like before -Continue with shoes that do not rub her toe like before -Continue with vascular follow-up yearly  -Patient to return to office in 4-6 weeks for re-check of toe.  Landis Martins, DPM

## 2020-07-18 DIAGNOSIS — Z7951 Long term (current) use of inhaled steroids: Secondary | ICD-10-CM | POA: Diagnosis not present

## 2020-07-18 DIAGNOSIS — C50411 Malignant neoplasm of upper-outer quadrant of right female breast: Secondary | ICD-10-CM | POA: Diagnosis not present

## 2020-07-18 DIAGNOSIS — I5023 Acute on chronic systolic (congestive) heart failure: Secondary | ICD-10-CM | POA: Diagnosis not present

## 2020-07-18 DIAGNOSIS — I2510 Atherosclerotic heart disease of native coronary artery without angina pectoris: Secondary | ICD-10-CM | POA: Diagnosis not present

## 2020-07-18 DIAGNOSIS — Z7902 Long term (current) use of antithrombotics/antiplatelets: Secondary | ICD-10-CM | POA: Diagnosis not present

## 2020-07-18 DIAGNOSIS — Z9981 Dependence on supplemental oxygen: Secondary | ICD-10-CM | POA: Diagnosis not present

## 2020-07-18 DIAGNOSIS — J9621 Acute and chronic respiratory failure with hypoxia: Secondary | ICD-10-CM | POA: Diagnosis not present

## 2020-07-18 DIAGNOSIS — Z8673 Personal history of transient ischemic attack (TIA), and cerebral infarction without residual deficits: Secondary | ICD-10-CM | POA: Diagnosis not present

## 2020-07-18 DIAGNOSIS — I110 Hypertensive heart disease with heart failure: Secondary | ICD-10-CM | POA: Diagnosis not present

## 2020-07-18 DIAGNOSIS — Z7982 Long term (current) use of aspirin: Secondary | ICD-10-CM | POA: Diagnosis not present

## 2020-07-18 DIAGNOSIS — I5032 Chronic diastolic (congestive) heart failure: Secondary | ICD-10-CM | POA: Diagnosis not present

## 2020-07-25 DIAGNOSIS — Z7902 Long term (current) use of antithrombotics/antiplatelets: Secondary | ICD-10-CM | POA: Diagnosis not present

## 2020-07-25 DIAGNOSIS — I2510 Atherosclerotic heart disease of native coronary artery without angina pectoris: Secondary | ICD-10-CM | POA: Diagnosis not present

## 2020-07-25 DIAGNOSIS — Z7982 Long term (current) use of aspirin: Secondary | ICD-10-CM | POA: Diagnosis not present

## 2020-07-25 DIAGNOSIS — Z7951 Long term (current) use of inhaled steroids: Secondary | ICD-10-CM | POA: Diagnosis not present

## 2020-07-25 DIAGNOSIS — I5023 Acute on chronic systolic (congestive) heart failure: Secondary | ICD-10-CM | POA: Diagnosis not present

## 2020-07-25 DIAGNOSIS — J9621 Acute and chronic respiratory failure with hypoxia: Secondary | ICD-10-CM | POA: Diagnosis not present

## 2020-07-25 DIAGNOSIS — Z9981 Dependence on supplemental oxygen: Secondary | ICD-10-CM | POA: Diagnosis not present

## 2020-07-25 DIAGNOSIS — I110 Hypertensive heart disease with heart failure: Secondary | ICD-10-CM | POA: Diagnosis not present

## 2020-07-25 DIAGNOSIS — I5032 Chronic diastolic (congestive) heart failure: Secondary | ICD-10-CM | POA: Diagnosis not present

## 2020-07-25 DIAGNOSIS — C50411 Malignant neoplasm of upper-outer quadrant of right female breast: Secondary | ICD-10-CM | POA: Diagnosis not present

## 2020-07-25 DIAGNOSIS — Z8673 Personal history of transient ischemic attack (TIA), and cerebral infarction without residual deficits: Secondary | ICD-10-CM | POA: Diagnosis not present

## 2020-07-31 DIAGNOSIS — Z8673 Personal history of transient ischemic attack (TIA), and cerebral infarction without residual deficits: Secondary | ICD-10-CM | POA: Diagnosis not present

## 2020-07-31 DIAGNOSIS — Z9981 Dependence on supplemental oxygen: Secondary | ICD-10-CM | POA: Diagnosis not present

## 2020-07-31 DIAGNOSIS — Z7951 Long term (current) use of inhaled steroids: Secondary | ICD-10-CM | POA: Diagnosis not present

## 2020-07-31 DIAGNOSIS — Z7902 Long term (current) use of antithrombotics/antiplatelets: Secondary | ICD-10-CM | POA: Diagnosis not present

## 2020-07-31 DIAGNOSIS — I5032 Chronic diastolic (congestive) heart failure: Secondary | ICD-10-CM | POA: Diagnosis not present

## 2020-07-31 DIAGNOSIS — J9621 Acute and chronic respiratory failure with hypoxia: Secondary | ICD-10-CM | POA: Diagnosis not present

## 2020-07-31 DIAGNOSIS — I5023 Acute on chronic systolic (congestive) heart failure: Secondary | ICD-10-CM | POA: Diagnosis not present

## 2020-07-31 DIAGNOSIS — C50411 Malignant neoplasm of upper-outer quadrant of right female breast: Secondary | ICD-10-CM | POA: Diagnosis not present

## 2020-07-31 DIAGNOSIS — I110 Hypertensive heart disease with heart failure: Secondary | ICD-10-CM | POA: Diagnosis not present

## 2020-07-31 DIAGNOSIS — I2510 Atherosclerotic heart disease of native coronary artery without angina pectoris: Secondary | ICD-10-CM | POA: Diagnosis not present

## 2020-07-31 DIAGNOSIS — Z7982 Long term (current) use of aspirin: Secondary | ICD-10-CM | POA: Diagnosis not present

## 2020-08-06 DIAGNOSIS — I509 Heart failure, unspecified: Secondary | ICD-10-CM | POA: Diagnosis not present

## 2020-08-08 DIAGNOSIS — Z955 Presence of coronary angioplasty implant and graft: Secondary | ICD-10-CM | POA: Diagnosis not present

## 2020-08-08 DIAGNOSIS — J9621 Acute and chronic respiratory failure with hypoxia: Secondary | ICD-10-CM | POA: Diagnosis not present

## 2020-08-08 DIAGNOSIS — Z9981 Dependence on supplemental oxygen: Secondary | ICD-10-CM | POA: Diagnosis not present

## 2020-08-08 DIAGNOSIS — C50411 Malignant neoplasm of upper-outer quadrant of right female breast: Secondary | ICD-10-CM | POA: Diagnosis not present

## 2020-08-08 DIAGNOSIS — I6990 Unspecified sequelae of unspecified cerebrovascular disease: Secondary | ICD-10-CM | POA: Diagnosis not present

## 2020-08-08 DIAGNOSIS — I5032 Chronic diastolic (congestive) heart failure: Secondary | ICD-10-CM | POA: Diagnosis not present

## 2020-08-08 DIAGNOSIS — D62 Acute posthemorrhagic anemia: Secondary | ICD-10-CM | POA: Diagnosis not present

## 2020-08-08 DIAGNOSIS — Z7982 Long term (current) use of aspirin: Secondary | ICD-10-CM | POA: Diagnosis not present

## 2020-08-08 DIAGNOSIS — I2510 Atherosclerotic heart disease of native coronary artery without angina pectoris: Secondary | ICD-10-CM | POA: Diagnosis not present

## 2020-08-08 DIAGNOSIS — I5022 Chronic systolic (congestive) heart failure: Secondary | ICD-10-CM | POA: Diagnosis not present

## 2020-08-08 DIAGNOSIS — E785 Hyperlipidemia, unspecified: Secondary | ICD-10-CM | POA: Diagnosis not present

## 2020-08-08 DIAGNOSIS — Z7902 Long term (current) use of antithrombotics/antiplatelets: Secondary | ICD-10-CM | POA: Diagnosis not present

## 2020-08-08 DIAGNOSIS — I110 Hypertensive heart disease with heart failure: Secondary | ICD-10-CM | POA: Diagnosis not present

## 2020-08-08 DIAGNOSIS — I5023 Acute on chronic systolic (congestive) heart failure: Secondary | ICD-10-CM | POA: Diagnosis not present

## 2020-08-08 DIAGNOSIS — I480 Paroxysmal atrial fibrillation: Secondary | ICD-10-CM | POA: Diagnosis not present

## 2020-08-08 DIAGNOSIS — Z8673 Personal history of transient ischemic attack (TIA), and cerebral infarction without residual deficits: Secondary | ICD-10-CM | POA: Diagnosis not present

## 2020-08-08 DIAGNOSIS — Z7951 Long term (current) use of inhaled steroids: Secondary | ICD-10-CM | POA: Diagnosis not present

## 2020-08-08 DIAGNOSIS — E039 Hypothyroidism, unspecified: Secondary | ICD-10-CM | POA: Diagnosis not present

## 2020-08-15 DIAGNOSIS — I2510 Atherosclerotic heart disease of native coronary artery without angina pectoris: Secondary | ICD-10-CM | POA: Diagnosis not present

## 2020-08-15 DIAGNOSIS — Z7982 Long term (current) use of aspirin: Secondary | ICD-10-CM | POA: Diagnosis not present

## 2020-08-15 DIAGNOSIS — Z7951 Long term (current) use of inhaled steroids: Secondary | ICD-10-CM | POA: Diagnosis not present

## 2020-08-15 DIAGNOSIS — I110 Hypertensive heart disease with heart failure: Secondary | ICD-10-CM | POA: Diagnosis not present

## 2020-08-15 DIAGNOSIS — Z9981 Dependence on supplemental oxygen: Secondary | ICD-10-CM | POA: Diagnosis not present

## 2020-08-15 DIAGNOSIS — Z7902 Long term (current) use of antithrombotics/antiplatelets: Secondary | ICD-10-CM | POA: Diagnosis not present

## 2020-08-15 DIAGNOSIS — Z8673 Personal history of transient ischemic attack (TIA), and cerebral infarction without residual deficits: Secondary | ICD-10-CM | POA: Diagnosis not present

## 2020-08-15 DIAGNOSIS — C50411 Malignant neoplasm of upper-outer quadrant of right female breast: Secondary | ICD-10-CM | POA: Diagnosis not present

## 2020-08-15 DIAGNOSIS — I5032 Chronic diastolic (congestive) heart failure: Secondary | ICD-10-CM | POA: Diagnosis not present

## 2020-08-15 DIAGNOSIS — J9621 Acute and chronic respiratory failure with hypoxia: Secondary | ICD-10-CM | POA: Diagnosis not present

## 2020-08-15 DIAGNOSIS — I5023 Acute on chronic systolic (congestive) heart failure: Secondary | ICD-10-CM | POA: Diagnosis not present

## 2020-08-21 DIAGNOSIS — Z8673 Personal history of transient ischemic attack (TIA), and cerebral infarction without residual deficits: Secondary | ICD-10-CM | POA: Diagnosis not present

## 2020-08-21 DIAGNOSIS — J9621 Acute and chronic respiratory failure with hypoxia: Secondary | ICD-10-CM | POA: Diagnosis not present

## 2020-08-21 DIAGNOSIS — C50411 Malignant neoplasm of upper-outer quadrant of right female breast: Secondary | ICD-10-CM | POA: Diagnosis not present

## 2020-08-21 DIAGNOSIS — Z7902 Long term (current) use of antithrombotics/antiplatelets: Secondary | ICD-10-CM | POA: Diagnosis not present

## 2020-08-21 DIAGNOSIS — Z7982 Long term (current) use of aspirin: Secondary | ICD-10-CM | POA: Diagnosis not present

## 2020-08-21 DIAGNOSIS — I2510 Atherosclerotic heart disease of native coronary artery without angina pectoris: Secondary | ICD-10-CM | POA: Diagnosis not present

## 2020-08-21 DIAGNOSIS — I5032 Chronic diastolic (congestive) heart failure: Secondary | ICD-10-CM | POA: Diagnosis not present

## 2020-08-21 DIAGNOSIS — I5023 Acute on chronic systolic (congestive) heart failure: Secondary | ICD-10-CM | POA: Diagnosis not present

## 2020-08-21 DIAGNOSIS — Z9981 Dependence on supplemental oxygen: Secondary | ICD-10-CM | POA: Diagnosis not present

## 2020-08-21 DIAGNOSIS — Z7951 Long term (current) use of inhaled steroids: Secondary | ICD-10-CM | POA: Diagnosis not present

## 2020-08-21 DIAGNOSIS — I110 Hypertensive heart disease with heart failure: Secondary | ICD-10-CM | POA: Diagnosis not present

## 2020-08-27 ENCOUNTER — Other Ambulatory Visit: Payer: Self-pay

## 2020-08-27 ENCOUNTER — Encounter: Payer: Self-pay | Admitting: Sports Medicine

## 2020-08-27 ENCOUNTER — Ambulatory Visit: Payer: Medicare Other | Admitting: Sports Medicine

## 2020-08-27 DIAGNOSIS — R234 Changes in skin texture: Secondary | ICD-10-CM | POA: Diagnosis not present

## 2020-08-27 DIAGNOSIS — I739 Peripheral vascular disease, unspecified: Secondary | ICD-10-CM | POA: Diagnosis not present

## 2020-08-27 DIAGNOSIS — M79674 Pain in right toe(s): Secondary | ICD-10-CM

## 2020-08-27 NOTE — Progress Notes (Signed)
Subjective: Olivia [name] is a 87 y.o. female patient who returns to the office for follow-up wound care on right.  Patient is assisted by son who reports that she is doing good, healing up good with big improvments and sister is applying the Medihoney to the great toe and states that the scab fell off a week ago. Reports a little pain off and on but otherwise does not complain much. No other pedal complaints noted.  Patient Active Problem List   Diagnosis Date Noted   Breast cancer [facility])    CAD (coronary artery disease)    Chronic systolic heart failure ([place])    Hyperlipidemia with target LDL less than 70    Hypertension    Malignant neoplasm of upper-outer quadrant of right female breast ([place])    PVD (peripheral vascular disease) ([place])    Senile osteoporosis    Dyspnea    Protein-calorie malnutrition, severe 11/23/2019   Elevated troponin    Age-related osteoporosis without current pathological fracture 10/29/2019   Long-term use of aspirin therapy 07/20/2017   Mixed hyperlipidemia 05/28/2017   Old MI (myocardial infarction) 05/28/2017   Palpitations 05/28/2017   CHF (congestive heart failure), NYHA class III, acute, systolic ([place]) [id number]   Ischemic dilated cardiomyopathy ([place]) 03/15/2017   Nonrheumatic aortic valve insufficiency 03/15/2017   Non-rheumatic mitral regurgitation 03/15/2017   S/P CABG (coronary artery bypass graft) 03/15/2017   Statin intolerance 03/15/2017   Dysphagia as late effect of cerebrovascular accident (CVA) 04/02/2016   Drug-induced skin rash [id number]   Embolic stroke involving right middle cerebral artery ([place]) 03/25/2016   Atherosclerotic heart disease of native coronary artery without angina pectoris 10/02/2014   Essential (primary) hypertension 10/02/2014   Other specified postprocedural states 10/02/2014   History of mitral valve repair 2008   Hx of CABG 2008    Current Outpatient Medications on File Prior to Visit  Medication Sig Dispense  Refill   aspirin EC 81 MG EC tablet Take 1 tablet (81 mg total) by mouth daily. Swallow whole. 30 tablet 11   clopidogrel (PLAVIX) 75 MG tablet Take 75 mg by mouth daily.     furosemide (LASIX) 20 MG tablet Take 1 tablet by mouth once daily (Patient taking differently: Take 20 mg by mouth daily.) 90 tablet 3   levothyroxine (SYNTHROID) 50 MCG tablet Take 50 mcg by mouth daily before breakfast.     MAGNESIUM CITRATE PO Take 400 mg by mouth in the morning, at noon, and at bedtime.     metoprolol tartrate (LOPRESSOR) 25 MG tablet Take 25 mg by mouth 2 (two) times daily. Pt takes a total of 75 mg daily     metoprolol tartrate (LOPRESSOR) 50 MG tablet Take 50 mg by mouth 2 (two) times daily. Pt takes a total of 75 mg daily     nitroGLYCERIN (NITROSTAT) 0.4 MG SL tablet Place 0.4 mg under the tongue every 5 (five) minutes as needed for chest pain.     ondansetron (ZOFRAN ODT) 4 MG disintegrating tablet Take 1 tablet (4 mg total) by mouth every 8 (eight) hours as needed for nausea or vomiting. 20 tablet 0   pantoprazole (PROTONIX) 40 MG tablet Take 40 mg by mouth daily.     promethazine (PHENERGAN) 12.5 MG tablet Take 12.5 mg by mouth 4 (four) times daily as needed for nausea/vomiting.     Red Yeast Rice 600 MG CAPS Take 600 mg by mouth in the morning and at bedtime.     spironolactone (ALDACTONE)  25 MG tablet Take 0.5 tablets (12.5 mg total) by mouth daily. 30 tablet 0   traMADol (ULTRAM) 50 MG tablet Take 50 mg by mouth every 6 (six) hours as needed for severe pain.     No current facility-administered medications on file prior to visit.    Allergies  Allergen Reactions   Iodinated Diagnostic Agents Rash   Ticagrelor Rash   Statins Other (See Comments)    myalgias     Objective:  General: Alert and oriented x3 in no acute distress  Dermatology:  There is a partial-thickness granular ulcer noted to the distal tuft of the right hallux that measures less than 1 cm at the area of previous  eschar that has now fallen off, no erythema, no edema. Preulcerative callus noted to right heel noted signs of infection.  Nails x10 are minimally elongated and thickened  Vascular: Dorsalis Pedis and Posterior Tibial pedal pulses nonpalpable however capillary fill time seems to be slightly improved at 3 seconds, (+) less dependent rubor that improves with elevation, scant pedal hair growth bilateral, no significant edema bilateral lower extremities however per patient sometimes the right foot swells, Temperature gradient decreased bilateral.  Neurology: Gross sensation intact via light touch bilateral.  Musculoskeletal: Mild tenderness with palpation right great toe.  There is significant bunion and hammertoe deformity noted.  Pes planus deformity noted.   Assessment and Plan: Problem List Items Addressed This Visit   None Visit Diagnoses     Eschar of toe    -  Primary   PAD (peripheral artery disease) (HCC)       Toe pain, right          -Complete examination performed -Re-Discussed treatment options eschar to toe and PAD and preulcerative nature of skin changes at heel on right -Cleansed wound on right great toe and applied Medihoney to first toe and clean sock and advised daughter to dress using the same once every other day like before until the granular base has healed -At no additional charge mechanically debrided all nails using a sterile tissue nipper without incident -Continue with offloading pads for her heels to use like before -Continue with shoes that do not rub her toe like before -Continue with vascular follow-up yearly like before -Patient to return to office in 4-6 weeks for re-check of toe.  Landis Martins, DPM

## 2020-08-29 DIAGNOSIS — Z9981 Dependence on supplemental oxygen: Secondary | ICD-10-CM | POA: Diagnosis not present

## 2020-08-29 DIAGNOSIS — Z8673 Personal history of transient ischemic attack (TIA), and cerebral infarction without residual deficits: Secondary | ICD-10-CM | POA: Diagnosis not present

## 2020-08-29 DIAGNOSIS — I110 Hypertensive heart disease with heart failure: Secondary | ICD-10-CM | POA: Diagnosis not present

## 2020-08-29 DIAGNOSIS — I5032 Chronic diastolic (congestive) heart failure: Secondary | ICD-10-CM | POA: Diagnosis not present

## 2020-08-29 DIAGNOSIS — Z7902 Long term (current) use of antithrombotics/antiplatelets: Secondary | ICD-10-CM | POA: Diagnosis not present

## 2020-08-29 DIAGNOSIS — Z7982 Long term (current) use of aspirin: Secondary | ICD-10-CM | POA: Diagnosis not present

## 2020-08-29 DIAGNOSIS — I5023 Acute on chronic systolic (congestive) heart failure: Secondary | ICD-10-CM | POA: Diagnosis not present

## 2020-08-29 DIAGNOSIS — Z7951 Long term (current) use of inhaled steroids: Secondary | ICD-10-CM | POA: Diagnosis not present

## 2020-08-29 DIAGNOSIS — I2510 Atherosclerotic heart disease of native coronary artery without angina pectoris: Secondary | ICD-10-CM | POA: Diagnosis not present

## 2020-08-29 DIAGNOSIS — J9621 Acute and chronic respiratory failure with hypoxia: Secondary | ICD-10-CM | POA: Diagnosis not present

## 2020-08-29 DIAGNOSIS — C50411 Malignant neoplasm of upper-outer quadrant of right female breast: Secondary | ICD-10-CM | POA: Diagnosis not present

## 2020-09-05 DIAGNOSIS — Z8673 Personal history of transient ischemic attack (TIA), and cerebral infarction without residual deficits: Secondary | ICD-10-CM | POA: Diagnosis not present

## 2020-09-05 DIAGNOSIS — I5023 Acute on chronic systolic (congestive) heart failure: Secondary | ICD-10-CM | POA: Diagnosis not present

## 2020-09-05 DIAGNOSIS — C50411 Malignant neoplasm of upper-outer quadrant of right female breast: Secondary | ICD-10-CM | POA: Diagnosis not present

## 2020-09-05 DIAGNOSIS — Z7951 Long term (current) use of inhaled steroids: Secondary | ICD-10-CM | POA: Diagnosis not present

## 2020-09-05 DIAGNOSIS — J9621 Acute and chronic respiratory failure with hypoxia: Secondary | ICD-10-CM | POA: Diagnosis not present

## 2020-09-05 DIAGNOSIS — I110 Hypertensive heart disease with heart failure: Secondary | ICD-10-CM | POA: Diagnosis not present

## 2020-09-05 DIAGNOSIS — N6311 Unspecified lump in the right breast, upper outer quadrant: Secondary | ICD-10-CM | POA: Diagnosis not present

## 2020-09-05 DIAGNOSIS — I2510 Atherosclerotic heart disease of native coronary artery without angina pectoris: Secondary | ICD-10-CM | POA: Diagnosis not present

## 2020-09-05 DIAGNOSIS — R928 Other abnormal and inconclusive findings on diagnostic imaging of breast: Secondary | ICD-10-CM | POA: Diagnosis not present

## 2020-09-05 DIAGNOSIS — Z7902 Long term (current) use of antithrombotics/antiplatelets: Secondary | ICD-10-CM | POA: Diagnosis not present

## 2020-09-05 DIAGNOSIS — Z7982 Long term (current) use of aspirin: Secondary | ICD-10-CM | POA: Diagnosis not present

## 2020-09-05 DIAGNOSIS — Z9981 Dependence on supplemental oxygen: Secondary | ICD-10-CM | POA: Diagnosis not present

## 2020-09-05 DIAGNOSIS — I5032 Chronic diastolic (congestive) heart failure: Secondary | ICD-10-CM | POA: Diagnosis not present

## 2020-09-06 DIAGNOSIS — N6311 Unspecified lump in the right breast, upper outer quadrant: Secondary | ICD-10-CM | POA: Diagnosis not present

## 2020-09-06 DIAGNOSIS — I509 Heart failure, unspecified: Secondary | ICD-10-CM | POA: Diagnosis not present

## 2020-09-10 DIAGNOSIS — Z7902 Long term (current) use of antithrombotics/antiplatelets: Secondary | ICD-10-CM | POA: Diagnosis not present

## 2020-09-10 DIAGNOSIS — J9621 Acute and chronic respiratory failure with hypoxia: Secondary | ICD-10-CM | POA: Diagnosis not present

## 2020-09-10 DIAGNOSIS — Z7951 Long term (current) use of inhaled steroids: Secondary | ICD-10-CM | POA: Diagnosis not present

## 2020-09-10 DIAGNOSIS — C50411 Malignant neoplasm of upper-outer quadrant of right female breast: Secondary | ICD-10-CM | POA: Diagnosis not present

## 2020-09-10 DIAGNOSIS — I5032 Chronic diastolic (congestive) heart failure: Secondary | ICD-10-CM | POA: Diagnosis not present

## 2020-09-10 DIAGNOSIS — Z9981 Dependence on supplemental oxygen: Secondary | ICD-10-CM | POA: Diagnosis not present

## 2020-09-10 DIAGNOSIS — I5023 Acute on chronic systolic (congestive) heart failure: Secondary | ICD-10-CM | POA: Diagnosis not present

## 2020-09-10 DIAGNOSIS — Z8673 Personal history of transient ischemic attack (TIA), and cerebral infarction without residual deficits: Secondary | ICD-10-CM | POA: Diagnosis not present

## 2020-09-10 DIAGNOSIS — I2510 Atherosclerotic heart disease of native coronary artery without angina pectoris: Secondary | ICD-10-CM | POA: Diagnosis not present

## 2020-09-10 DIAGNOSIS — I110 Hypertensive heart disease with heart failure: Secondary | ICD-10-CM | POA: Diagnosis not present

## 2020-09-10 DIAGNOSIS — Z7982 Long term (current) use of aspirin: Secondary | ICD-10-CM | POA: Diagnosis not present

## 2020-09-11 ENCOUNTER — Telehealth: Payer: Self-pay

## 2020-09-11 ENCOUNTER — Telehealth: Payer: Self-pay | Admitting: Cardiology

## 2020-09-11 NOTE — Telephone Encounter (Signed)
   [facility] Pre-operative Risk Assessment    Request for surgical clearance:  What type of surgery is being performed? Excisional Biopsy of the Right Breast    When is this surgery scheduled? 09/16/2020   What type of clearance is required (medical clearance vs. Pharmacy clearance to hold med vs. Both)? Both  Are there any medications that need to be held prior to surgery and how long?Meds not specified however,the patient is on ASA 33m and PLavix  Practice name and name of physician performing surgery[facility]    What is your office phone number: [phone]   7.   What is your office fax number: [phone].   Anesthesia type (None, local, MAC, general) ? General Anesthesia   JBasil DessPrevatt 09/11/2020, 2:32 PM  _________________________________________________________________   (provider comments below)

## 2020-09-11 NOTE — Telephone Encounter (Signed)
Left message to call back at 14 52 on 09/11/2020.  Patient needs call back.

## 2020-09-11 NOTE — Telephone Encounter (Signed)
Not sure why a new phone note was opened. Pre-op clearance request information is in other phone note from today.

## 2020-09-11 NOTE — Telephone Encounter (Signed)
Called to confirm pt's appt but daughter had to reschedule due to appt conflict/she also wanted to check on cardiac clearance sent yesterday 09/10/2020. I told her I would put in a note about it, and we would call her when completed.  Thank you  Ammie Dalton

## 2020-09-11 NOTE — Telephone Encounter (Signed)
Follow U:     Patient's daughter is calling to check on the status of patient's clearance per surgeon office. i

## 2020-09-11 NOTE — Telephone Encounter (Signed)
Follow up:   Patient returning a call back.  

## 2020-09-11 NOTE — Telephone Encounter (Signed)
Please contact patient/patient's daughter and let them know that we have not yet received any preoperative cardiac evaluation request from outside offices.  Thank you,  Jossie Ng. Uziel Covault NP-C    09/11/2020, 2:33 PM [address] Office [phone] Fax [phone]

## 2020-09-12 ENCOUNTER — Ambulatory Visit: Payer: Medicare Other | Admitting: Cardiology

## 2020-09-12 DIAGNOSIS — Z8673 Personal history of transient ischemic attack (TIA), and cerebral infarction without residual deficits: Secondary | ICD-10-CM | POA: Diagnosis not present

## 2020-09-12 DIAGNOSIS — I2510 Atherosclerotic heart disease of native coronary artery without angina pectoris: Secondary | ICD-10-CM | POA: Diagnosis not present

## 2020-09-12 DIAGNOSIS — J9621 Acute and chronic respiratory failure with hypoxia: Secondary | ICD-10-CM | POA: Diagnosis not present

## 2020-09-12 DIAGNOSIS — Z7902 Long term (current) use of antithrombotics/antiplatelets: Secondary | ICD-10-CM | POA: Diagnosis not present

## 2020-09-12 DIAGNOSIS — Z7982 Long term (current) use of aspirin: Secondary | ICD-10-CM | POA: Diagnosis not present

## 2020-09-12 DIAGNOSIS — C50411 Malignant neoplasm of upper-outer quadrant of right female breast: Secondary | ICD-10-CM | POA: Diagnosis not present

## 2020-09-12 DIAGNOSIS — I5032 Chronic diastolic (congestive) heart failure: Secondary | ICD-10-CM | POA: Diagnosis not present

## 2020-09-12 DIAGNOSIS — Z9981 Dependence on supplemental oxygen: Secondary | ICD-10-CM | POA: Diagnosis not present

## 2020-09-12 DIAGNOSIS — I110 Hypertensive heart disease with heart failure: Secondary | ICD-10-CM | POA: Diagnosis not present

## 2020-09-12 DIAGNOSIS — Z7951 Long term (current) use of inhaled steroids: Secondary | ICD-10-CM | POA: Diagnosis not present

## 2020-09-12 DIAGNOSIS — I5023 Acute on chronic systolic (congestive) heart failure: Secondary | ICD-10-CM | POA: Diagnosis not present

## 2020-09-12 NOTE — Telephone Encounter (Signed)
   Patient Name: Olivia [name]  DOB: 05-17-1929 MRN: [id number]  Primary Cardiologist: Berniece Salines, DO  Chart revisited as part of pre-operative protocol coverage. I had spoken with patient/daughter earlier and they did not identify any unstable cardiac symptoms. Therefore, based on ACC/AHA guidelines, Olivia [name] would be at acceptable risk for the planned procedure without further cardiovascular testing if surgery is felt clinically necessary.   Reviewed blood thinner dosing with Dr. Harriet Masson who replied:"Okay to hold Plavix for 5 days preoperative.  She has significant burden of coronary artery disease and preferably from a cardiovascular standpoint aspirin could be continued unless the surgeon prefers absolutely no antiplatelet prior to the procedure. In that case she can also hold her aspirin for 5 days presurgery."  I will route to callback to please call patient/daughter Olivia [name] back to relay Dr Terrial Rhodes feedback and to let surgeon know of these recommendations by phone since the family had already been holding both as of yesterday. If ASA can be continued, that would be Dr. Terrial Rhodes preference. Will also route this bundled recommendation to requesting provider via Epic fax function. Please call with questions.    Charlie Pitter, PA-C 09/12/2020, 10:51 AM

## 2020-09-12 NOTE — Telephone Encounter (Signed)
   Patient Name: Olivia [name]  DOB: Oct 30, 1929 MRN: [id number]  Primary Cardiologist: Berniece Salines, DO  Chart reviewed as part of pre-operative protocol coverage. I am coming on board to cover pre-op today. We have received notification that the daughter is calling back. Her surgery is only a few days away so I will reach out to Dr. Harriet Masson to find out if she may hold her ASA/Plavix for the procedure - daughter already began holding yesterday as they are trying to do the procedure on Monday. She will be having breast surgery due to abnormal mammogram. She has history of CAD s/p CABG/PCI, MV repair, CHF, aortic stenosis, mitral regurgitation as outlined in chart. Daughter Sula Soda reports she is doing very well without any new cardiac symptoms. The patient does not formally exercise but participates in all ADLs including grocery shopping with her daughter without any recent cardiac symptoms. Dr. Harriet Masson - Please route response to [facility] PREOP (the pre-op pool). Thank you.   Charlie Pitter, PA-C 09/12/2020, 9:31 AM

## 2020-09-12 NOTE — Telephone Encounter (Signed)
Called and let her know the clearance was placed and the pre-op pool will take over from here. She verbalized understanding and thanked me for calling her.

## 2020-09-12 NOTE — Telephone Encounter (Signed)
Okay to hold Plavix for 5 days preoperative.  She has significant burden of coronary artery disease and preferably from a cardiovascular standpoint aspirin could be continued unless the surgeon prefers absolutely no antiplatelet prior to the procedure. In that case she can also hold her aspirin for 5 days presurgery.

## 2020-09-12 NOTE — Telephone Encounter (Signed)
I have also been given a fax # of [phone] to fax clearance to and not what was listed on the clearance notes.

## 2020-09-12 NOTE — Telephone Encounter (Signed)
I s/w surgeon's office and have gone over Dr. Terrial Rhodes preference for pt to remain on ASA for procedure, though ok to hold Plavix x 5 days prior. Per Dr. Shon Hale office with [facility] surgery that would be fine for the pt to remain on ASA for her procedure and just hold Plavix x 5 days prior.  I then s/w [name], pt's daughter and have gone over the same recommendations as above with pt's daughter. [name] is agreeable to plan of care and thanked me for the help. Per [name] she has only held the Plavix as of yesterday and has not held the ASA. Advised to remain on ASA and only hold Plavix x 5 days prior to procedure. I will fax these notes to surgeon's office.

## 2020-09-16 DIAGNOSIS — N6311 Unspecified lump in the right breast, upper outer quadrant: Secondary | ICD-10-CM | POA: Diagnosis not present

## 2020-09-16 DIAGNOSIS — K219 Gastro-esophageal reflux disease without esophagitis: Secondary | ICD-10-CM | POA: Diagnosis not present

## 2020-09-16 DIAGNOSIS — I110 Hypertensive heart disease with heart failure: Secondary | ICD-10-CM | POA: Diagnosis not present

## 2020-09-16 DIAGNOSIS — Z8673 Personal history of transient ischemic attack (TIA), and cerebral infarction without residual deficits: Secondary | ICD-10-CM | POA: Diagnosis not present

## 2020-09-16 DIAGNOSIS — I509 Heart failure, unspecified: Secondary | ICD-10-CM | POA: Diagnosis not present

## 2020-09-16 DIAGNOSIS — Z7982 Long term (current) use of aspirin: Secondary | ICD-10-CM | POA: Diagnosis not present

## 2020-09-16 DIAGNOSIS — I10 Essential (primary) hypertension: Secondary | ICD-10-CM | POA: Diagnosis not present

## 2020-09-16 DIAGNOSIS — Z87891 Personal history of nicotine dependence: Secondary | ICD-10-CM | POA: Diagnosis not present

## 2020-09-16 DIAGNOSIS — Z7902 Long term (current) use of antithrombotics/antiplatelets: Secondary | ICD-10-CM | POA: Diagnosis not present

## 2020-09-16 DIAGNOSIS — L821 Other seborrheic keratosis: Secondary | ICD-10-CM | POA: Diagnosis not present

## 2020-09-16 DIAGNOSIS — Z9981 Dependence on supplemental oxygen: Secondary | ICD-10-CM | POA: Diagnosis not present

## 2020-09-16 DIAGNOSIS — Z79899 Other long term (current) drug therapy: Secondary | ICD-10-CM | POA: Diagnosis not present

## 2020-09-16 DIAGNOSIS — I2510 Atherosclerotic heart disease of native coronary artery without angina pectoris: Secondary | ICD-10-CM | POA: Diagnosis not present

## 2020-09-16 DIAGNOSIS — I252 Old myocardial infarction: Secondary | ICD-10-CM | POA: Diagnosis not present

## 2020-09-16 DIAGNOSIS — Z951 Presence of aortocoronary bypass graft: Secondary | ICD-10-CM | POA: Diagnosis not present

## 2020-09-16 DIAGNOSIS — E039 Hypothyroidism, unspecified: Secondary | ICD-10-CM | POA: Diagnosis not present

## 2020-09-16 DIAGNOSIS — Z853 Personal history of malignant neoplasm of breast: Secondary | ICD-10-CM | POA: Diagnosis not present

## 2020-09-17 DIAGNOSIS — Z0000 Encounter for general adult medical examination without abnormal findings: Secondary | ICD-10-CM | POA: Diagnosis not present

## 2020-09-17 DIAGNOSIS — E785 Hyperlipidemia, unspecified: Secondary | ICD-10-CM | POA: Diagnosis not present

## 2020-09-17 DIAGNOSIS — Z9181 History of falling: Secondary | ICD-10-CM | POA: Diagnosis not present

## 2020-09-19 DIAGNOSIS — I2510 Atherosclerotic heart disease of native coronary artery without angina pectoris: Secondary | ICD-10-CM | POA: Diagnosis not present

## 2020-09-19 DIAGNOSIS — J9621 Acute and chronic respiratory failure with hypoxia: Secondary | ICD-10-CM | POA: Diagnosis not present

## 2020-09-19 DIAGNOSIS — Z7902 Long term (current) use of antithrombotics/antiplatelets: Secondary | ICD-10-CM | POA: Diagnosis not present

## 2020-09-19 DIAGNOSIS — I110 Hypertensive heart disease with heart failure: Secondary | ICD-10-CM | POA: Diagnosis not present

## 2020-09-19 DIAGNOSIS — Z8673 Personal history of transient ischemic attack (TIA), and cerebral infarction without residual deficits: Secondary | ICD-10-CM | POA: Diagnosis not present

## 2020-09-19 DIAGNOSIS — I5023 Acute on chronic systolic (congestive) heart failure: Secondary | ICD-10-CM | POA: Diagnosis not present

## 2020-09-19 DIAGNOSIS — C50411 Malignant neoplasm of upper-outer quadrant of right female breast: Secondary | ICD-10-CM | POA: Diagnosis not present

## 2020-09-19 DIAGNOSIS — Z9981 Dependence on supplemental oxygen: Secondary | ICD-10-CM | POA: Diagnosis not present

## 2020-09-19 DIAGNOSIS — I5032 Chronic diastolic (congestive) heart failure: Secondary | ICD-10-CM | POA: Diagnosis not present

## 2020-09-19 DIAGNOSIS — Z7951 Long term (current) use of inhaled steroids: Secondary | ICD-10-CM | POA: Diagnosis not present

## 2020-09-19 DIAGNOSIS — Z7982 Long term (current) use of aspirin: Secondary | ICD-10-CM | POA: Diagnosis not present

## 2020-09-23 DIAGNOSIS — B9689 Other specified bacterial agents as the cause of diseases classified elsewhere: Secondary | ICD-10-CM | POA: Diagnosis not present

## 2020-09-23 DIAGNOSIS — J019 Acute sinusitis, unspecified: Secondary | ICD-10-CM | POA: Diagnosis not present

## 2020-09-24 ENCOUNTER — Ambulatory Visit: Payer: Medicare Other | Admitting: Sports Medicine

## 2020-09-24 ENCOUNTER — Encounter: Payer: Self-pay | Admitting: Sports Medicine

## 2020-09-24 ENCOUNTER — Telehealth: Payer: Self-pay | Admitting: Oncology

## 2020-09-24 ENCOUNTER — Other Ambulatory Visit: Payer: Self-pay

## 2020-09-24 DIAGNOSIS — I739 Peripheral vascular disease, unspecified: Secondary | ICD-10-CM | POA: Diagnosis not present

## 2020-09-24 DIAGNOSIS — R234 Changes in skin texture: Secondary | ICD-10-CM

## 2020-09-24 DIAGNOSIS — M79674 Pain in right toe(s): Secondary | ICD-10-CM | POA: Diagnosis not present

## 2020-09-24 DIAGNOSIS — C50911 Malignant neoplasm of unspecified site of right female breast: Secondary | ICD-10-CM | POA: Diagnosis not present

## 2020-09-24 NOTE — Telephone Encounter (Signed)
Patient's daughter called to verify 8/31 Appt's

## 2020-09-24 NOTE — Progress Notes (Signed)
Subjective: Olivia [name] is a 87 y.o. female patient who returns to the office for follow-up wound care on right.  Patient is assisted by son who reports that she is doing good, looking better with no pain redness swelling or drainage with dry scab to the area.  Sister is currently doing Medihoney to the area no other pedal complaints noted.  Patient Active Problem List   Diagnosis Date Noted   Breast cancer [facility])    CAD (coronary artery disease)    Chronic systolic heart failure ([place])    Hyperlipidemia with target LDL less than 70    Hypertension    Malignant neoplasm of upper-outer quadrant of right female breast ([place])    PVD (peripheral vascular disease) ([place])    Senile osteoporosis    Dyspnea    Protein-calorie malnutrition, severe 11/23/2019   Elevated troponin    Age-related osteoporosis without current pathological fracture 10/29/2019   Long-term use of aspirin therapy 07/20/2017   Mixed hyperlipidemia 05/28/2017   Old MI (myocardial infarction) 05/28/2017   Palpitations 05/28/2017   CHF (congestive heart failure), NYHA class III, acute, systolic ([place]) [id number]   Ischemic dilated cardiomyopathy ([place]) 03/15/2017   Nonrheumatic aortic valve insufficiency 03/15/2017   Non-rheumatic mitral regurgitation 03/15/2017   S/P CABG (coronary artery bypass graft) 03/15/2017   Statin intolerance 03/15/2017   Dysphagia as late effect of cerebrovascular accident (CVA) 04/02/2016   Drug-induced skin rash [id number]   Embolic stroke involving right middle cerebral artery ([place]) 03/25/2016   Atherosclerotic heart disease of native coronary artery without angina pectoris 10/02/2014   Essential (primary) hypertension 10/02/2014   Other specified postprocedural states 10/02/2014   History of mitral valve repair 2008   Hx of CABG 2008    Current Outpatient Medications on File Prior to Visit  Medication Sig Dispense Refill   aspirin EC 81 MG EC tablet Take 1 tablet (81 mg total) by mouth  daily. Swallow whole. 30 tablet 11   clopidogrel (PLAVIX) 75 MG tablet Take 75 mg by mouth daily.     furosemide (LASIX) 20 MG tablet Take 1 tablet by mouth once daily (Patient taking differently: Take 20 mg by mouth daily.) 90 tablet 3   levothyroxine (SYNTHROID) 50 MCG tablet Take 50 mcg by mouth daily before breakfast.     MAGNESIUM CITRATE PO Take 400 mg by mouth in the morning, at noon, and at bedtime.     metoprolol tartrate (LOPRESSOR) 25 MG tablet Take 25 mg by mouth 2 (two) times daily. Pt takes a total of 75 mg daily     metoprolol tartrate (LOPRESSOR) 50 MG tablet Take 50 mg by mouth 2 (two) times daily. Pt takes a total of 75 mg daily     nitroGLYCERIN (NITROSTAT) 0.4 MG SL tablet Place 0.4 mg under the tongue every 5 (five) minutes as needed for chest pain.     ondansetron (ZOFRAN ODT) 4 MG disintegrating tablet Take 1 tablet (4 mg total) by mouth every 8 (eight) hours as needed for nausea or vomiting. 20 tablet 0   pantoprazole (PROTONIX) 40 MG tablet Take 40 mg by mouth daily.     promethazine (PHENERGAN) 12.5 MG tablet Take 12.5 mg by mouth 4 (four) times daily as needed for nausea/vomiting.     Red Yeast Rice 600 MG CAPS Take 600 mg by mouth in the morning and at bedtime.     spironolactone (ALDACTONE) 25 MG tablet Take 0.5 tablets (12.5 mg total) by mouth daily. 30 tablet 0  traMADol (ULTRAM) 50 MG tablet Take 50 mg by mouth every 6 (six) hours as needed for severe pain.     No current facility-administered medications on file prior to visit.    Allergies  Allergen Reactions   Iodinated Diagnostic Agents Rash   Ticagrelor Rash   Statins Other (See Comments)    myalgias     Objective:  General: Alert and oriented x3 in no acute distress  Dermatology:  There is dry scab that measures less than 0.5 cm at the distal tuft of the right hallux.  No erythema, no edema. Preulcerative callus noted to right heel noted signs of infection.  Nails x10 are minimally elongated and  thickened  Vascular: Dorsalis Pedis and Posterior Tibial pedal pulses nonpalpable however capillary fill time seems to be slightly improved at 3 seconds, (+) less dependent rubor that improves with elevation, scant pedal hair growth bilateral, no significant edema bilateral lower extremities however per patient sometimes the right foot swells, Temperature gradient decreased bilateral.  Neurology: Gross sensation intact via light touch bilateral.  Musculoskeletal: Minimal tenderness with palpation right great toe.  There is significant bunion and hammertoe deformity noted.  Pes planus deformity noted.   Assessment and Plan: Problem List Items Addressed This Visit   None Visit Diagnoses     Eschar of toe    -  Primary   PAD (peripheral artery disease) (HCC)       Toe pain, right          -Complete examination performed -Re-Discussed treatment options resolved eschar to toe and PAD and preulcerative nature of skin changes at heel on right -No trimming done at this visit to right hallux -Advised to continue with Medihoney may decrease to using every other day and cover with a loose white sock as needed -Continue with shoes that do not rub her toe like before -Continue with vascular follow-up yearly as previously noted -Patient to return to office in 10-12 weeks for re-check of toe.  Landis Martins, DPM

## 2020-09-24 NOTE — Progress Notes (Signed)
[facility]  [address] [place],  [place]  [address] [phone]  Clinic Day:  10/02/2020  Referring physician: Nicoletta Dress, MD  This document serves as a record of services personally performed by Hosie Poisson, MD. It was created on their behalf by [facility] E, a trained medical scribe. The creation of this record is based on the scribe's personal observations and the provider's statements to them.  CHIEF COMPLAINT:  CC:  Stage II B hormone receptor positive breast cancer and age-related osteoporosis  Current Treatment:    Prolia every 6 months   HISTORY OF PRESENT ILLNESS:  Olivia [name] is a 87 y.o. 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  She was scheduled for a right partial mastectomy in February 2018, but had an acute myocardial infarction and stroke.  She required placement of 2 coronary artery stents.  The surgery was delayed until August.  Final pathology revealed multifocal invasive ductal carcinoma with a 2.4 cm lesion and a 1.2 cm lesion, both grade 2 with clear margins, as well as intermediate grade ductal carcinoma in situ.  One of 2 nodes was positive with extracapsular extension and lymphovascular invasion.  Estrogen and progesterone receptors were positive and her 2 Neu was negative.  Ki 67 was 10%.  Due to her other medical comorbidities, we did not feel she was a candidate for aggressive chemotherapy.  She was placed on anastrozole 1 mg daily in September 2018.  Bone density scan in September 2018 revealed osteoporosis with a statistically significant decrease in the bone mineral density compared to the prior study of 2005. Her T-score of the right femur was -3.6, which was 6.8% worse, with a T-score of -3.2 of the right forearm.  She was already taking calcium 600 mg twice daily.  Due to the risk of worsening osteopenia with anastrozole, she was also placed  on alendronate 70 mg weekly.  She went to the hospital in early 2020 twice for bleeding ulcers. She had four blood transfusions.  She was due for mammography in May, but this was delayed due to COVID-19.  Bilateral diagnostic mammogram in August of 2020 revealed a 3 cm area of pleomorphic calcifications in the left breast.  There was no evidence of malignancy in the right breast.  Biopsy of the left breast lesion revealed focal atypical ductal hyperplasia with calcifications, with no in situ or invasive malignancy identified.  She underwent left lumpectomy in September.  Final pathology revealed atypical ductal hyperplasia and florid usual ductal hyperplasia with calcifications.  No in situ or invasive malignancy was identified.  Margins were clear.  Bone density from September 2020 revealed worsening osteoporosis with a T-score of -3.6 of the left femur, previously -3.4.  The right forearm measured -4.3, previously -3.2.  We therefore stopped the anastrozole after 2 years and switched to raloxifene for chemoprevention, which can also also treat her osteoporosis.  She was  placed on Prolia injections every 6 months for osteoporosis.  Due to severe arthralgias affecting her quality of life, we discontinued raloxifene in March 2021. She presented to the emergency department in mid September, and then again on October 2nd due to acute [name] injury.  CT urogram from October revealed a 7 mm obstructing proximal right ureteral calculus at the L3-4 level, with  moderate right hydronephrosis, and a 16 mm calculus in the left renal pelvis. There were additional nonobstructing bilateral renal calculi, and  a 17 mm hyperdense lesion in the anterior right upper [name], possibly reflecting a hemorrhagic cyst.  She saw Dr. Nila Nephew he discontinued calcium and vitamin D.  She was scheduled for lithotripsy.    INTERVAL HISTORY:  Olivia [name] is here for follow up to review recent imaging and pathology results.  Annual bilateral mammogram  and ultrasound from August 4th revealed an interval 1.7 x 1.5 x 0.4 cm irregular mass with pleomorphic calcifications in the skin and underlying breast in the 10 o'clock position of the right breast.  No evidence of malignancy elsewhere in either breast.  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  Tumor measures 1.5 cm and is invasive within dermis and subcutaneous tissue.  Skin ulceration, lymphovascular invasion and perineural invasion identified.  Margins free of neoplasm.  This stages her as a stage I (T1c N0 M0).  HER2 was equivocal 2+, but negative by FISH.  Estrogen receptor was positive at 100% and progesterone receptor was positive at 5%.  Ki67 was 15%.  Blood counts and chemistries are unremarkable except for a BUN of 39, and a creatinine of 1.3, improved from 1.5.  Her  appetite is good, and her weight is stable since her last visit.  She denies fever, chills or other signs of infection.  She denies nausea, vomiting, bowel issues, or abdominal pain.  She denies sore throat, cough, dyspnea, or chest pain.  REVIEW OF SYSTEMS:  Review of Systems  Constitutional: Negative.  Negative for appetite change, chills, fatigue, fever and unexpected weight change.  HENT:  Negative.    Eyes: Negative.   Respiratory: Negative.  Negative for chest tightness, cough, hemoptysis, shortness of breath and wheezing.   Cardiovascular: Negative.  Negative for chest pain, leg swelling and palpitations.  Gastrointestinal: Negative.  Negative for abdominal distention, abdominal pain, blood in stool, constipation, diarrhea, nausea and vomiting.  Endocrine: Negative.   Genitourinary: Negative.  Negative for difficulty urinating, dysuria, frequency and hematuria.   Musculoskeletal: Negative.  Negative for arthralgias, back pain, flank pain, gait problem and myalgias.  Skin: Negative.    Neurological: Negative.  Negative for dizziness, extremity weakness, gait problem, headaches, light-headedness, numbness, seizures and speech difficulty.  Hematological: Negative.   Psychiatric/Behavioral: Negative.  Negative for depression and sleep disturbance. The patient is not nervous/anxious.    VITALS:  Blood pressure 135/63, height _0  (1.702 m), weight 126 lb 12.8 oz (57.5 kg).  Wt Readings from Last 3 Encounters:  10/02/20 126 lb 12.8 oz (57.5 kg)  05/20/20 124 lb (56.2 kg)  05/17/20 126 lb 8 oz (57.4 kg)    Body mass index is 19.86 kg/m.  Performance status (ECOG): 0 - Asymptomatic  PHYSICAL EXAM:  Physical Exam Constitutional:      General: She is not in acute distress.    Appearance: Normal appearance. She is normal weight.  HENT:     Head: Normocephalic and atraumatic.  Eyes:     General: No scleral icterus.    Extraocular Movements: Extraocular movements intact.     Conjunctiva/sclera: Conjunctivae normal.     Pupils: Pupils are equal, round, and reactive to light.  Cardiovascular:     Rate and Rhythm: Normal rate and regular rhythm.     Pulses: Normal pulses.     Heart sounds: Normal heart sounds. No murmur heard.   No friction rub. No gallop.  Pulmonary:     Effort: Pulmonary  effort is normal. No respiratory distress.     Breath sounds: Normal breath sounds.  Chest:     Comments: Linear incision in the lateral right breast close to the axillary line with minimal bruising but healing well.  No masses in either breast. Abdominal:     General: Bowel sounds are normal. There is no distension.     Palpations: Abdomen is soft. There is no hepatomegaly, splenomegaly or mass.     Tenderness: There is no abdominal tenderness.  Musculoskeletal:        General: Normal range of motion.     Cervical back: Normal range of motion and neck supple.     Right lower leg: No edema.     Left lower leg: No edema.  Lymphadenopathy:     Cervical: No cervical adenopathy.   Skin:    General: Skin is warm and dry.  Neurological:     General: No focal deficit present.     Mental Status: She is alert and oriented to person, place, and time. Mental status is at baseline.  Psychiatric:        Mood and Affect: Mood normal.        Behavior: Behavior normal.        Thought Content: Thought content normal.        Judgment: Judgment normal.   LABS:   CBC Latest Ref Rng & Units 10/02/2020 05/17/2020 02/28/2020  WBC - 8.5 7.8 10.1  Hemoglobin 12.0 - 16.0 13.3 11.5(A) 11.0(L)  Hematocrit 36 - 46 41 36 35.6(L)  Platelets 150 - 399 320 272 322   CMP Latest Ref Rng & Units 10/02/2020 05/17/2020 02/28/2020  Glucose 70 - 99 mg/dL - - 162(H)  BUN 4 - 21 39(A) 51(A) 29(H)  Creatinine 0.5 - 1.1 1.3(A) 1.3(A) 1.18(H)  Sodium 137 - 147 139 140 140  Potassium 3.4 - 5.3 4.1 4.3 4.8  Chloride 99 - 108 99 104 102  CO2 13 - 22 29(A) 28(A) 27  Calcium 8.7 - 10.7 10.2 9.9 9.4  Total Protein 6.5 - 8.1 g/dL - - -  Total Bilirubin 0.3 - 1.2 mg/dL - - -  Alkaline Phos 25 - 125 49 55 -  AST 13 - 35 37(A) 29 -  ALT 7 - 35 14 15 -    STUDIES:  No results found.   EXAM: 09/05/2020 DIGITAL DIAGNOSTIC BILATERAL MAMMOGRAM WITH TOMOSYNTHESIS AND CAD; ULTRASOUND RIGHT BREAST  TECHNIQUE: Bilateral digital diagnostic mammography and breast tomosynthesis was performed. The images were evaluated with computer-aided detection.; Targeted ultrasound examination of the right breast was performed.  COMPARISON:  Previous exam(s).  ACR Breast Density Category b: There are scattered areas of fibroglandular density.  FINDINGS: There is an interval oval mass with some irregular and indistinct margins in the posterior upper outer right breast containing interval heterogeneous calcifications including some linear calcifications and linearly arranged calcifications. This corresponds to the area of a small amount of recent drainage, marked with a metallic marker.  No findings elsewhere in  either breast suspicious for malignancy.  On physical exam, the patient has an approximately 1.5 cm rounded, raised, circumscribed mass in the skin in the 10 o'clock position of the right breast, 8 cm from the nipple. This has an irregular red area centrally in the remainder of the mass is a faint pink in color. There are no palpable right axillary lymph nodes.  Targeted ultrasound is performed, showing a 1.7 x 1.5 x 0.4 cm oval horizontally oriented, mildly  hypoechoic, circumscribed mass within the skin and protruding into the underlying subcutaneous breast tissue in the 10 o'clock position of the right breast, 8 cm from the nipple. There is an irregular more hypoechoic and anechoic component posteriorly. There are calcifications and internal blood flow with power Doppler in both components. Both components combined measure 1.7 x 1 5 x 0 9 cm.  Ultrasound of the right axilla demonstrated normal right axillary lymph nodes.  IMPRESSION: 1. Interval 1.7 x 1.5 x 0.4 cm irregular mass with pleomorphic calcifications in the skin and underlying breast in the 10 o'clock position of the right breast. This is suspicious for a cutaneous recurrence of the patient's previous breast cancer. 2. No evidence of malignancy elsewhere in either breast.  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 Swallow whole. 30 tablet 11   clopidogrel (PLAVIX) 75 MG tablet Take 75 mg by mouth daily.     furosemide (LASIX) 20 MG tablet Take 1 tablet by mouth once daily (Patient taking differently: Take 20 mg by mouth daily.) 90 tablet 3   levothyroxine (SYNTHROID) 50 MCG tablet Take 50 mcg by mouth daily before breakfast.     MAGNESIUM CITRATE PO Take 400 mg by mouth in the morning, at noon, and at bedtime.     metoprolol tartrate (LOPRESSOR) 25 MG tablet Take 25 mg by mouth 2 (two) times daily. Pt takes a total of 75 mg daily     metoprolol tartrate (LOPRESSOR) 50 MG tablet Take 50 mg by mouth 2 (two) times daily. Pt takes a total of 75 mg daily     nitroGLYCERIN (NITROSTAT) 0.4 MG SL tablet Place 0.4 mg under the tongue every 5 (five) minutes as needed for chest pain.     ondansetron (ZOFRAN ODT) 4 MG disintegrating tablet Take 1 tablet (4 mg total) by mouth every 8 (eight) hours as needed for nausea or vomiting. 20 tablet 0   pantoprazole (PROTONIX) 40 MG tablet Take 40 mg by mouth daily.     promethazine (PHENERGAN) 12.5 MG tablet Take 12.5 mg by mouth 4 (four) times daily as needed for nausea/vomiting.     Red Yeast Rice 600 MG CAPS Take 600 mg by mouth in the morning and at bedtime.     spironolactone (ALDACTONE) 25 MG tablet Take 0.5 tablets (12.5 mg total) by mouth daily. 30 tablet 0   traMADol (ULTRAM) 50 MG tablet Take 50 mg by mouth every 6 (six) hours as needed for severe pain.     No current facility-administered medications for this visit.     ASSESSMENT & PLAN:   Assessment:  1. Stage IIB hormone receptor positive right breast cancer, January 2018 treated with lumpectomy and adjuvant hormonal therapy for about 3 years.  2. Atypical ductal hyperplasia of the left breast, August 2020, status post lumpectomy.  3.  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  She underwent excisional biopsy with Dr. Lilia Pro.  HER2 was equivocal 2+, but negative by FISH.  Estrogen receptor was positive at 100% and progesterone receptor was positive at 5%.  Ki67 was 15%.    3. Worsening osteoporosis, despite alendronate.  She is now on Prolia every 6 months and will be due in October. Calcium and vitamin D have been discontinued due to significant renal lithiasis.  We will schedule her for repeat bone density in September.  4. 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    5. Bilateral renal lithiasis, she has not currently having symptoms.  Plan:   Excisional biopsy with Dr. Lilia Pro confirmed invasive ductal carcinoma, grade 2, and high grade DCIS.  I am concerned about placing her on aromatase inhibitor due to her osteoporosis, and we reviewed possible treatment with monthly fulvestrant injections.  We will obtain repeat bone density in September before making a decision on treatment.  We will plan to see her back in October with a CBC, comprehensive metabolic panel and bone density prior to her next Prolia.   At that time, we will make a decision on treatment.  Since this is a stage IA, she may be best served by observation only in view of her age and co-morbidities. The patient understands the plans discussed today and is in agreement with them.  She knows to contact our office if she develops concerns prior to her next appointment.   I, Rita Ohara, am acting as scribe for Derwood Kaplan, MD  I have reviewed this report as typed by the medical scribe, and it is complete and accurate.

## 2020-09-26 DIAGNOSIS — I5032 Chronic diastolic (congestive) heart failure: Secondary | ICD-10-CM | POA: Diagnosis not present

## 2020-09-26 DIAGNOSIS — I110 Hypertensive heart disease with heart failure: Secondary | ICD-10-CM | POA: Diagnosis not present

## 2020-09-26 DIAGNOSIS — I5023 Acute on chronic systolic (congestive) heart failure: Secondary | ICD-10-CM | POA: Diagnosis not present

## 2020-09-26 DIAGNOSIS — Z8673 Personal history of transient ischemic attack (TIA), and cerebral infarction without residual deficits: Secondary | ICD-10-CM | POA: Diagnosis not present

## 2020-09-26 DIAGNOSIS — Z7951 Long term (current) use of inhaled steroids: Secondary | ICD-10-CM | POA: Diagnosis not present

## 2020-09-26 DIAGNOSIS — Z9981 Dependence on supplemental oxygen: Secondary | ICD-10-CM | POA: Diagnosis not present

## 2020-09-26 DIAGNOSIS — J9621 Acute and chronic respiratory failure with hypoxia: Secondary | ICD-10-CM | POA: Diagnosis not present

## 2020-09-26 DIAGNOSIS — Z7982 Long term (current) use of aspirin: Secondary | ICD-10-CM | POA: Diagnosis not present

## 2020-09-26 DIAGNOSIS — C50411 Malignant neoplasm of upper-outer quadrant of right female breast: Secondary | ICD-10-CM | POA: Diagnosis not present

## 2020-09-26 DIAGNOSIS — Z7902 Long term (current) use of antithrombotics/antiplatelets: Secondary | ICD-10-CM | POA: Diagnosis not present

## 2020-09-26 DIAGNOSIS — I2510 Atherosclerotic heart disease of native coronary artery without angina pectoris: Secondary | ICD-10-CM | POA: Diagnosis not present

## 2020-09-27 DIAGNOSIS — I10 Essential (primary) hypertension: Secondary | ICD-10-CM | POA: Diagnosis not present

## 2020-09-27 DIAGNOSIS — I110 Hypertensive heart disease with heart failure: Secondary | ICD-10-CM | POA: Diagnosis not present

## 2020-09-27 DIAGNOSIS — R109 Unspecified abdominal pain: Secondary | ICD-10-CM | POA: Diagnosis not present

## 2020-09-27 DIAGNOSIS — I509 Heart failure, unspecified: Secondary | ICD-10-CM | POA: Diagnosis not present

## 2020-10-02 ENCOUNTER — Inpatient Hospital Stay: Payer: Medicare Other | Attending: Oncology

## 2020-10-02 ENCOUNTER — Other Ambulatory Visit: Payer: Self-pay | Admitting: Hematology and Oncology

## 2020-10-02 ENCOUNTER — Other Ambulatory Visit: Payer: Self-pay

## 2020-10-02 ENCOUNTER — Inpatient Hospital Stay: Payer: Medicare Other | Admitting: Oncology

## 2020-10-02 ENCOUNTER — Encounter: Payer: Self-pay | Admitting: Oncology

## 2020-10-02 ENCOUNTER — Telehealth: Payer: Self-pay | Admitting: Oncology

## 2020-10-02 ENCOUNTER — Other Ambulatory Visit: Payer: Self-pay | Admitting: Oncology

## 2020-10-02 VITALS — BP 135/63 | Ht 67.0 in | Wt 126.8 lb

## 2020-10-02 DIAGNOSIS — Z170 Estrogen receptor positive status [ER+]: Secondary | ICD-10-CM

## 2020-10-02 DIAGNOSIS — M810 Age-related osteoporosis without current pathological fracture: Secondary | ICD-10-CM

## 2020-10-02 DIAGNOSIS — D649 Anemia, unspecified: Secondary | ICD-10-CM | POA: Diagnosis not present

## 2020-10-02 DIAGNOSIS — C50411 Malignant neoplasm of upper-outer quadrant of right female breast: Secondary | ICD-10-CM

## 2020-10-02 DIAGNOSIS — N6092 Unspecified benign mammary dysplasia of left breast: Secondary | ICD-10-CM

## 2020-10-02 LAB — CBC AND DIFFERENTIAL
HCT: 41 (ref 36–46)
Hemoglobin: 13.3 (ref 12.0–16.0)
Neutrophils Absolute: 5.95
Platelets: 320 (ref 150–399)
WBC: 8.5

## 2020-10-02 LAB — BASIC METABOLIC PANEL
BUN: 39 — AB (ref 4–21)
CO2: 29 — AB (ref 13–22)
Chloride: 99 (ref 99–108)
Creatinine: 1.3 — AB (ref 0.5–1.1)
Glucose: 108
Potassium: 4.1 (ref 3.4–5.3)
Sodium: 139 (ref 137–147)

## 2020-10-02 LAB — CBC: RBC: 4.51 (ref 3.87–5.11)

## 2020-10-02 LAB — HEPATIC FUNCTION PANEL
ALT: 14 (ref 7–35)
AST: 37 — AB (ref 13–35)
Alkaline Phosphatase: 49 (ref 25–125)
Bilirubin, Total: 0.6

## 2020-10-02 LAB — COMPREHENSIVE METABOLIC PANEL
Albumin: 4.3 (ref 3.5–5.0)
Calcium: 10.2 (ref 8.7–10.7)

## 2020-10-02 NOTE — Telephone Encounter (Signed)
Per 8/31 Los next appt scheduled and given to patient

## 2020-10-04 ENCOUNTER — Encounter: Payer: Self-pay | Admitting: Oncology

## 2020-10-04 DIAGNOSIS — J9621 Acute and chronic respiratory failure with hypoxia: Secondary | ICD-10-CM | POA: Diagnosis not present

## 2020-10-04 DIAGNOSIS — Z9981 Dependence on supplemental oxygen: Secondary | ICD-10-CM | POA: Diagnosis not present

## 2020-10-04 DIAGNOSIS — I2510 Atherosclerotic heart disease of native coronary artery without angina pectoris: Secondary | ICD-10-CM | POA: Diagnosis not present

## 2020-10-04 DIAGNOSIS — Z8673 Personal history of transient ischemic attack (TIA), and cerebral infarction without residual deficits: Secondary | ICD-10-CM | POA: Diagnosis not present

## 2020-10-04 DIAGNOSIS — Z7951 Long term (current) use of inhaled steroids: Secondary | ICD-10-CM | POA: Diagnosis not present

## 2020-10-04 DIAGNOSIS — I5032 Chronic diastolic (congestive) heart failure: Secondary | ICD-10-CM | POA: Diagnosis not present

## 2020-10-04 DIAGNOSIS — Z7902 Long term (current) use of antithrombotics/antiplatelets: Secondary | ICD-10-CM | POA: Diagnosis not present

## 2020-10-04 DIAGNOSIS — C50411 Malignant neoplasm of upper-outer quadrant of right female breast: Secondary | ICD-10-CM | POA: Diagnosis not present

## 2020-10-04 DIAGNOSIS — I110 Hypertensive heart disease with heart failure: Secondary | ICD-10-CM | POA: Diagnosis not present

## 2020-10-04 DIAGNOSIS — Z7982 Long term (current) use of aspirin: Secondary | ICD-10-CM | POA: Diagnosis not present

## 2020-10-04 DIAGNOSIS — I5023 Acute on chronic systolic (congestive) heart failure: Secondary | ICD-10-CM | POA: Diagnosis not present

## 2020-10-07 DIAGNOSIS — I509 Heart failure, unspecified: Secondary | ICD-10-CM | POA: Diagnosis not present

## 2020-10-10 DIAGNOSIS — Z8673 Personal history of transient ischemic attack (TIA), and cerebral infarction without residual deficits: Secondary | ICD-10-CM | POA: Diagnosis not present

## 2020-10-10 DIAGNOSIS — I5023 Acute on chronic systolic (congestive) heart failure: Secondary | ICD-10-CM | POA: Diagnosis not present

## 2020-10-10 DIAGNOSIS — I110 Hypertensive heart disease with heart failure: Secondary | ICD-10-CM | POA: Diagnosis not present

## 2020-10-10 DIAGNOSIS — Z7982 Long term (current) use of aspirin: Secondary | ICD-10-CM | POA: Diagnosis not present

## 2020-10-10 DIAGNOSIS — J9621 Acute and chronic respiratory failure with hypoxia: Secondary | ICD-10-CM | POA: Diagnosis not present

## 2020-10-10 DIAGNOSIS — Z7951 Long term (current) use of inhaled steroids: Secondary | ICD-10-CM | POA: Diagnosis not present

## 2020-10-10 DIAGNOSIS — Z7902 Long term (current) use of antithrombotics/antiplatelets: Secondary | ICD-10-CM | POA: Diagnosis not present

## 2020-10-10 DIAGNOSIS — Z9981 Dependence on supplemental oxygen: Secondary | ICD-10-CM | POA: Diagnosis not present

## 2020-10-10 DIAGNOSIS — C50411 Malignant neoplasm of upper-outer quadrant of right female breast: Secondary | ICD-10-CM | POA: Diagnosis not present

## 2020-10-10 DIAGNOSIS — I2510 Atherosclerotic heart disease of native coronary artery without angina pectoris: Secondary | ICD-10-CM | POA: Diagnosis not present

## 2020-10-10 DIAGNOSIS — I5032 Chronic diastolic (congestive) heart failure: Secondary | ICD-10-CM | POA: Diagnosis not present

## 2020-10-15 DIAGNOSIS — I5032 Chronic diastolic (congestive) heart failure: Secondary | ICD-10-CM | POA: Diagnosis not present

## 2020-10-15 DIAGNOSIS — I5023 Acute on chronic systolic (congestive) heart failure: Secondary | ICD-10-CM | POA: Diagnosis not present

## 2020-10-15 DIAGNOSIS — C50411 Malignant neoplasm of upper-outer quadrant of right female breast: Secondary | ICD-10-CM | POA: Diagnosis not present

## 2020-10-15 DIAGNOSIS — Z7951 Long term (current) use of inhaled steroids: Secondary | ICD-10-CM | POA: Diagnosis not present

## 2020-10-15 DIAGNOSIS — J9621 Acute and chronic respiratory failure with hypoxia: Secondary | ICD-10-CM | POA: Diagnosis not present

## 2020-10-15 DIAGNOSIS — Z7982 Long term (current) use of aspirin: Secondary | ICD-10-CM | POA: Diagnosis not present

## 2020-10-15 DIAGNOSIS — I2510 Atherosclerotic heart disease of native coronary artery without angina pectoris: Secondary | ICD-10-CM | POA: Diagnosis not present

## 2020-10-15 DIAGNOSIS — Z7902 Long term (current) use of antithrombotics/antiplatelets: Secondary | ICD-10-CM | POA: Diagnosis not present

## 2020-10-15 DIAGNOSIS — I110 Hypertensive heart disease with heart failure: Secondary | ICD-10-CM | POA: Diagnosis not present

## 2020-10-15 DIAGNOSIS — Z9981 Dependence on supplemental oxygen: Secondary | ICD-10-CM | POA: Diagnosis not present

## 2020-10-15 DIAGNOSIS — Z8673 Personal history of transient ischemic attack (TIA), and cerebral infarction without residual deficits: Secondary | ICD-10-CM | POA: Diagnosis not present

## 2020-10-24 ENCOUNTER — Other Ambulatory Visit: Payer: Self-pay

## 2020-10-24 DIAGNOSIS — Z7951 Long term (current) use of inhaled steroids: Secondary | ICD-10-CM | POA: Diagnosis not present

## 2020-10-24 DIAGNOSIS — I110 Hypertensive heart disease with heart failure: Secondary | ICD-10-CM | POA: Diagnosis not present

## 2020-10-24 DIAGNOSIS — Z7902 Long term (current) use of antithrombotics/antiplatelets: Secondary | ICD-10-CM | POA: Diagnosis not present

## 2020-10-24 DIAGNOSIS — I5023 Acute on chronic systolic (congestive) heart failure: Secondary | ICD-10-CM | POA: Diagnosis not present

## 2020-10-24 DIAGNOSIS — Z8673 Personal history of transient ischemic attack (TIA), and cerebral infarction without residual deficits: Secondary | ICD-10-CM | POA: Diagnosis not present

## 2020-10-24 DIAGNOSIS — J9621 Acute and chronic respiratory failure with hypoxia: Secondary | ICD-10-CM | POA: Diagnosis not present

## 2020-10-24 DIAGNOSIS — Z7982 Long term (current) use of aspirin: Secondary | ICD-10-CM | POA: Diagnosis not present

## 2020-10-24 DIAGNOSIS — C50411 Malignant neoplasm of upper-outer quadrant of right female breast: Secondary | ICD-10-CM | POA: Diagnosis not present

## 2020-10-24 DIAGNOSIS — I5032 Chronic diastolic (congestive) heart failure: Secondary | ICD-10-CM | POA: Diagnosis not present

## 2020-10-24 DIAGNOSIS — I2510 Atherosclerotic heart disease of native coronary artery without angina pectoris: Secondary | ICD-10-CM | POA: Diagnosis not present

## 2020-10-24 DIAGNOSIS — Z9981 Dependence on supplemental oxygen: Secondary | ICD-10-CM | POA: Diagnosis not present

## 2020-10-25 ENCOUNTER — Ambulatory Visit: Payer: Medicare Other | Admitting: Cardiology

## 2020-10-25 ENCOUNTER — Telehealth: Payer: Self-pay | Admitting: *Deleted

## 2020-10-25 ENCOUNTER — Encounter: Payer: Self-pay | Admitting: Cardiology

## 2020-10-25 ENCOUNTER — Other Ambulatory Visit: Payer: Self-pay

## 2020-10-25 VITALS — BP 156/76 | HR 70 | Ht 64.0 in | Wt 125.2 lb

## 2020-10-25 DIAGNOSIS — I10 Essential (primary) hypertension: Secondary | ICD-10-CM

## 2020-10-25 DIAGNOSIS — I63411 Cerebral infarction due to embolism of right middle cerebral artery: Secondary | ICD-10-CM | POA: Diagnosis not present

## 2020-10-25 DIAGNOSIS — I255 Ischemic cardiomyopathy: Secondary | ICD-10-CM | POA: Diagnosis not present

## 2020-10-25 DIAGNOSIS — E782 Mixed hyperlipidemia: Secondary | ICD-10-CM | POA: Diagnosis not present

## 2020-10-25 DIAGNOSIS — I420 Dilated cardiomyopathy: Secondary | ICD-10-CM | POA: Diagnosis not present

## 2020-10-25 NOTE — Patient Instructions (Signed)
Medication Instructions:  Your physician recommends that you continue on your current medications as directed. Please refer to the Current Medication list given to you today.  *If you need a refill on your cardiac medications before your next appointment, please call your pharmacy*   Lab Work: Your physician recommends that you return for lab work in: [place], [place] If you have labs (blood work) drawn today and your tests are completely normal, you will receive your results only by: [place] (if you have MyChart) OR A paper copy in the mail If you have any lab test that is abnormal or we need to change your treatment, we will call you to review the results.   Testing/Procedures: None   Follow-Up: At [facility], you and your health needs are our priority.  As part of our continuing mission to provide you with exceptional heart care, we have created designated Provider Care Teams.  These Care Teams include your primary Cardiologist (physician) and Advanced Practice Providers (APPs -  Physician Assistants and Nurse Practitioners) who all work together to provide you with the care you need, when you need it.  We recommend signing up for the patient portal called "MyChart".  Sign up information is provided on this After Visit Summary.  MyChart is used to connect with patients for Virtual Visits (Telemedicine).  Patients are able to view lab/test results, encounter notes, upcoming appointments, etc.  Non-urgent messages can be sent to your provider as well.   To learn more about what you can do with MyChart, go to [email or web address].    Your next appointment:   As needed  The format for your next appointment:   In Person  Provider:   Dr. Harriet Masson   Other Instructions

## 2020-10-25 NOTE — Telephone Encounter (Signed)
-----   Message from Landis Martins, [place] sent at 10/25/2020  8:01 AM EDT ----- Yes they can switch to Hydragel ----- Message ----- From: Viviana Simpler, PMAC Sent: 10/25/2020   7:59 AM EDT To: Landis Martins, DPM  [name] from [facility] called and stated that the wound was not progressing and could we try hydra-gel daily-[name] ([phone])

## 2020-10-25 NOTE — Progress Notes (Signed)
Cardiology Office Note:    Date:  10/25/2020   ID:  Olivia [name], DOB [birth year], MRN [id number]  PCP:  Nicoletta Dress, MD  Cardiologist:  Berniece Salines, DO  Electrophysiologist:  None   Referring MD: Nicoletta Dress, MD   " I am doing well"  History of Present Illness:    Olivia [name] is a 87 y.o. female with a hx of  coronary disease status post CABG with PCI in 2008, mitral valve repair in 2008 ischemic cardiomyopathy, chronic systolic heart failure EF in October 30 5 to 40% pulmonary hypertension, mild aortic stenosis, mild to moderate mitral regurgitation is here today for follow-up visit.   At her last visit which was January 02, 2020 at that time she was with her daughter.  She was post hospitalization and had a heart cath.  I continue all her medications and at that time she was not on an ACE inhibitor and Entresto due to the fact that her blood pressure was on the lower side.  At her visit on March 28, 2020 at that time he is doing well from a cardiac standpoint.  Since I saw the patient her daughter tells me she had diarrhea and was seen in the emergency department at [facility] at that time her blood pressure was elevated.  She was treated and discharged home. Unfortunately she does not have an arm  Past Medical History:  Diagnosis Date   Age-related osteoporosis without current pathological fracture 10/29/2019   Atherosclerotic heart disease of native coronary artery without angina pectoris 10/02/2014   Atypical ductal hyperplasia of left breast 10/12/2018   Breast cancer ([place])    CAD (coronary artery disease) hx of cabg   CHF (congestive heart failure), NYHA class III, acute, systolic ([place]) 3/41/9622   Chronic systolic heart failure (HCC)    Drug-induced skin rash 03/28/2016   Dysphagia as late effect of cerebrovascular accident (CVA) 04/02/2016   Dyspnea    Elevated troponin    Embolic stroke involving right middle cerebral artery ([place]) 03/25/2016    Essential (primary) hypertension 10/02/2014   History of mitral valve repair 2008   Hx of CABG 2008   Hyperlipidemia with target LDL less than 70    Hypertension    Ischemic dilated cardiomyopathy ([place]) 03/15/2017   Long-term use of aspirin therapy 07/20/2017   Malignant neoplasm of upper-outer quadrant of right female breast ([place])    Mixed hyperlipidemia 05/28/2017   Non-rheumatic mitral regurgitation 03/15/2017   Nonrheumatic aortic valve insufficiency 03/15/2017   Old MI (myocardial infarction) 05/28/2017   Other specified postprocedural states 10/02/2014   Palpitations 05/28/2017   Protein-calorie malnutrition, severe 11/23/2019   PVD (peripheral vascular disease) (HCC)    S/P CABG (coronary artery bypass graft) 03/15/2017   Senile osteoporosis    Statin intolerance 03/15/2017    Past Surgical History:  Procedure Laterality Date   BREAST LUMPECTOMY     IR FEM POP ART ATHERECT INC PTA MOD SED  02/28/2020   IR ILIAC ART STENT INC PTA MOD SED  02/28/2020   IR US GUIDE VASC ACCESS LEFT  02/28/2020   MITRAL VALVE REPAIR (MV)/CORONARY ARTERY BYPASS GRAFTING (CABG)  2018   RIGHT/LEFT HEART CATH AND CORONARY/GRAFT ANGIOGRAPHY N/A 11/17/2019   Procedure: RIGHT/LEFT HEART CATH AND CORONARY/GRAFT ANGIOGRAPHY;  Surgeon: Jettie Booze, MD;  Location: [facility];  Service: Cardiovascular;  Laterality: N/A;    Current Medications: Current Meds  Medication Sig   furosemide (LASIX) 20 MG  tablet Take 1 tablet by mouth once daily     Allergies:   Iodinated diagnostic agents, Ticagrelor, and Statins   Social History   Socioeconomic History   Marital status: Widowed    Spouse name: Not on file   Number of children: Not on file   Years of education: Not on file   Highest education level: Not on file  Occupational History   Not on file  Tobacco Use   Smoking status: Former    Types: Cigarettes    Quit date: 2008    Years since quitting: 14.7   Smokeless tobacco: Never  Substance  and Sexual Activity   Alcohol use: Not Currently   Drug use: Never   Sexual activity: Not on file  Other Topics Concern   Not on file  Social History Narrative   Not on file   Social Determinants of Health   Financial Resource Strain: Not on file  Food Insecurity: Not on file  Transportation Needs: Not on file  Physical Activity: Not on file  Stress: Not on file  Social Connections: Not on file     Family History: The patient's family history includes Hypertension in her father.  ROS:   Review of Systems  Constitution: Negative for decreased appetite, fever and weight gain.  HENT: Negative for congestion, ear discharge, hoarse voice and sore throat.   Eyes: Negative for discharge, redness, vision loss in right eye and visual halos.  Cardiovascular: Negative for chest pain, dyspnea on exertion, leg swelling, orthopnea and palpitations.  Respiratory: Negative for cough, hemoptysis, shortness of breath and snoring.   Endocrine: Negative for heat intolerance and polyphagia.  Hematologic/Lymphatic: Negative for bleeding problem. Does not bruise/bleed easily.  Skin: Negative for flushing, nail changes, rash and suspicious lesions.  Musculoskeletal: Negative for arthritis, joint pain, muscle cramps, myalgias, neck pain and stiffness.  Gastrointestinal: Negative for abdominal pain, bowel incontinence, diarrhea and excessive appetite.  Genitourinary: Negative for decreased libido, genital sores and incomplete emptying.  Neurological: Negative for brief paralysis, focal weakness, headaches and loss of balance.  Psychiatric/Behavioral: Negative for altered mental status, depression and suicidal ideas.  Allergic/Immunologic: Negative for HIV exposure and persistent infections.    EKGs/Labs/Other Studies Reviewed:    The following studies were reviewed today:   EKG: None today   Echocardiogram February 2022 IMPRESSIONS     1. Left ventricular ejection fraction, by estimation,  is 55 to 60%. The left ventricle has normal function. The left ventricle has no regional wall motion abnormalities. There is moderate left ventricular hypertrophy.   2. Right ventricular systolic function is normal. The right ventricular size is normal. Mildly increased right ventricular wall thickness. There is normal pulmonary artery systolic pressure.   3. Left atrial size was mildly dilated.   4. The mitral valve is myxomatous. Mild mitral valve regurgitation. No evidence of mitral stenosis.   5. The aortic valve was not well visualized. Aortic valve regurgitation is moderate. Mild aortic valve sclerosis is present, with no evidence of aortic valve stenosis.   6. The inferior vena cava is normal in size with greater than 50% respiratory variability, suggesting right atrial pressure of 3 mmHg.   FINDINGS   Left Ventricle: Left ventricular ejection fraction, by estimation, is 55 to 60%. The left ventricle has normal function. The left ventricle has no regional wall motion abnormalities. The left ventricular internal cavity size was normal in size. There is   moderate left ventricular hypertrophy. Left ventricular diastolic parameters are consistent with  Grade II diastolic dysfunction  (pseudonormalization).   Right Ventricle: The right ventricular size is normal. Mildly increased  right ventricular wall thickness. Right ventricular systolic function is  normal. There is normal pulmonary artery systolic pressure. The tricuspid  regurgitant velocity is 2.65 m/s,  and with an assumed right atrial pressure of 3 mmHg, the estimated right  ventricular systolic pressure is 00.8 mmHg.   Left [facility]: Left atrial size was mildly dilated.   Right [facility]: Right atrial size was normal in size.   Pericardium: There is no evidence of pericardial effusion.   Mitral Valve: The mitral valve is myxomatous. Mild mitral valve  regurgitation. No evidence of mitral valve stenosis. MV peak gradient, 9.7  mmHg.  The mean mitral valve gradient is 4.0 mmHg.   Tricuspid Valve: The tricuspid valve is normal in structure. Tricuspid  valve regurgitation is mild . No evidence of tricuspid stenosis.   Aortic Valve: The aortic valve was not well visualized. Aortic valve  regurgitation is moderate. Aortic regurgitation PHT measures 286 msec.  Mild aortic valve sclerosis is present, with no evidence of aortic valve  stenosis. Aortic valve mean gradient  measures 12.0 mmHg. Aortic valve peak gradient measures 20.2 mmHg. Aortic  valve area, by VTI measures 1.52 cm.   Pulmonic Valve: The pulmonic valve was normal in structure. Pulmonic valve  regurgitation is not visualized. No evidence of pulmonic stenosis.   Aorta: The aortic root is normal in size and structure.   Venous: The inferior vena cava is normal in size with greater than 50%  respiratory variability, suggesting right atrial pressure of 3 mmHg.   IAS/Shunts: No atrial level shunt detected by color flow Doppler Recent Labs: 12/06/2019: B Natriuretic Peptide 884.4; TSH 5.340 01/02/2020: Magnesium 2.6 10/02/2020: ALT 14; BUN 39; Creatinine 1.3; Hemoglobin 13.3; Platelets 320; Potassium 4.1; Sodium 139  Recent Lipid Panel    Component Value Date/Time   CHOL 221 (H) 11/21/2019 0712   TRIG 165 (H) 11/21/2019 0712   HDL 45 11/21/2019 0712   CHOLHDL 4.9 11/21/2019 0712   VLDL 33 11/21/2019 0712   LDLCALC 143 (H) 11/21/2019 0712    Physical Exam:    VS:  BP (!) 156/76   Pulse 70   Ht 5\' 4"  (1.626 m)   Wt 125 lb 3.2 oz (56.8 kg)   SpO2 95%   BMI 21.49 kg/m     Wt Readings from Last 3 Encounters:  10/25/20 125 lb 3.2 oz (56.8 kg)  10/02/20 126 lb 12.8 oz (57.5 kg)  05/20/20 124 lb (56.2 kg)     GEN: Well nourished, well developed in no acute distress HEENT: Normal NECK: No JVD; No carotid bruits LYMPHATICS: No lymphadenopathy CARDIAC: S1S2 noted,RRR, no murmurs, rubs, gallops RESPIRATORY:  Clear to auscultation without rales,  wheezing or rhonchi  ABDOMEN: Soft, non-tender, non-distended, +bowel sounds, no guarding. EXTREMITIES: No edema, No cyanosis, no clubbing MUSCULOSKELETAL:  No deformity  SKIN: Warm and dry NEUROLOGIC:  Alert and oriented x 3, non-focal PSYCHIATRIC:  Normal affect, good insight  ASSESSMENT:    1. Essential (primary) hypertension   2. Embolic stroke involving right middle cerebral artery ([place])   3. Ischemic dilated cardiomyopathy ([place])   4. Mixed hyperlipidemia    PLAN:     Her blood pressure slightly elevated in the office today.  The patient and her daughter tells me that blood pressure at home is usually running in the 120s.  For now I am not going to increase her antihypertensive regimen  to avoid any hypotension.  I did talk to the patient and her daughter about the transition to our [place] office at this time she [facility]  She prefers not to set up any follow-ups.  The patient is in agreement with the above plan. The patient left the office in stable condition.     Medication Adjustments/Labs and Tests Ordered: Current medicines are reviewed at length with the patient today.  Concerns regarding medicines are outlined above.  Orders Placed This Encounter  Procedures   Basic metabolic panel   Magnesium   No orders of the defined types were placed in this encounter.   Patient Instructions  Medication Instructions:  Your physician recommends that you continue on your current medications as directed. Please refer to the Current Medication list given to you today.  *If you need a refill on your cardiac medications before your next appointment, please call your pharmacy*   Lab Work: Your physician recommends that you return for lab work in: [place], [place] If you have labs (blood work) drawn today and your tests are completely normal, you will receive your results only by: [place] (if you have MyChart) OR A paper  copy in the mail If you have any lab test that is abnormal or we need to change your treatment, we will call you to review the results.   Testing/Procedures: None   Follow-Up: At [facility], you and your health needs are our priority.  As part of our continuing mission to provide you with exceptional heart care, we have created designated Provider Care Teams.  These Care Teams include your primary Cardiologist (physician) and Advanced Practice Providers (APPs -  Physician Assistants and Nurse Practitioners) who all work together to provide you with the care you need, when you need it.  We recommend signing up for the patient portal called "MyChart".  Sign up information is provided on this After Visit Summary.  MyChart is used to connect with patients for Virtual Visits (Telemedicine).  Patients are able to view lab/test results, encounter notes, upcoming appointments, etc.  Non-urgent messages can be sent to your provider as well.   To learn more about what you can do with MyChart, go to [email or web address].    Your next appointment:   As needed  The format for your next appointment:   In Person  Provider:   Dr. Harriet Masson   Other Instructions   Adopting a Healthy Lifestyle.  Know what a healthy weight is for you (roughly BMI <25) and aim to maintain this   Aim for 7+ servings of fruits and vegetables daily   65-80+ fluid ounces of water or unsweet tea for healthy kidneys   Limit to max 1 drink of alcohol per day; avoid smoking/tobacco   Limit animal fats in diet for cholesterol and heart health - choose grass fed whenever available   Avoid highly processed foods, and foods high in saturated/trans fats   Aim for low stress - take time to unwind and care for your mental health   Aim for 150 min of moderate intensity exercise weekly for heart health, and weights twice weekly for bone health   Aim for 7-9 hours of sleep daily   When it comes to diets, agreement about the  perfect plan isnt easy to find, even among the experts. Experts at the [place] developed an idea known as the Healthy Eating Plate. Just imagine a plate divided  into logical, healthy portions.   The emphasis is on diet quality:   Load up on vegetables and fruits - one-half of your plate: Aim for color and variety, and remember that potatoes dont count.   Go for whole grains - one-quarter of your plate: Whole wheat, barley, wheat berries, quinoa, oats, brown rice, and foods made with them. If you want pasta, go with whole wheat pasta.   Protein power - one-quarter of your plate: Fish, chicken, beans, and nuts are all healthy, versatile protein sources. Limit red meat.   The diet, however, does go beyond the plate, offering a few other suggestions.   Use healthy plant oils, such as olive, canola, soy, corn, sunflower and peanut. Check the labels, and avoid partially hydrogenated oil, which have unhealthy trans fats.   If youre thirsty, drink water. Coffee and tea are good in moderation, but skip sugary drinks and limit milk and dairy products to one or two daily servings.   The type of carbohydrate in the diet is more important than the amount. Some sources of carbohydrates, such as vegetables, fruits, whole grains, and beans-are healthier than others.   Finally, stay active  Signed, Berniece Salines, DO  10/25/2020 1:22 PM    [facility]

## 2020-10-25 NOTE — Telephone Encounter (Signed)
Called and spoke with Baker Janus from [facility] and relayed the message per Dr Cannon Kettle. [name]

## 2020-10-26 LAB — BASIC METABOLIC PANEL
BUN/Creatinine Ratio: 28 (ref 12–28)
BUN: 36 mg/dL — ABNORMAL HIGH (ref 8–27)
CO2: 27 mmol/L (ref 20–29)
Calcium: 10.4 mg/dL — ABNORMAL HIGH (ref 8.7–10.3)
Chloride: 99 mmol/L (ref 96–106)
Creatinine, Ser: 1.29 mg/dL — ABNORMAL HIGH (ref 0.57–1.00)
Glucose: 89 mg/dL (ref 65–99)
Potassium: 4.6 mmol/L (ref 3.5–5.2)
Sodium: 139 mmol/L (ref 134–144)
eGFR: 40 mL/min/{1.73_m2} — ABNORMAL LOW (ref >59)

## 2020-10-26 LAB — MAGNESIUM: Magnesium: 2.7 mg/dL — ABNORMAL HIGH (ref 1.6–2.3)

## 2020-10-31 DIAGNOSIS — Z7982 Long term (current) use of aspirin: Secondary | ICD-10-CM | POA: Diagnosis not present

## 2020-10-31 DIAGNOSIS — I5023 Acute on chronic systolic (congestive) heart failure: Secondary | ICD-10-CM | POA: Diagnosis not present

## 2020-10-31 DIAGNOSIS — Z9981 Dependence on supplemental oxygen: Secondary | ICD-10-CM | POA: Diagnosis not present

## 2020-10-31 DIAGNOSIS — I5032 Chronic diastolic (congestive) heart failure: Secondary | ICD-10-CM | POA: Diagnosis not present

## 2020-10-31 DIAGNOSIS — I2510 Atherosclerotic heart disease of native coronary artery without angina pectoris: Secondary | ICD-10-CM | POA: Diagnosis not present

## 2020-10-31 DIAGNOSIS — C50411 Malignant neoplasm of upper-outer quadrant of right female breast: Secondary | ICD-10-CM | POA: Diagnosis not present

## 2020-10-31 DIAGNOSIS — Z8673 Personal history of transient ischemic attack (TIA), and cerebral infarction without residual deficits: Secondary | ICD-10-CM | POA: Diagnosis not present

## 2020-10-31 DIAGNOSIS — Z7951 Long term (current) use of inhaled steroids: Secondary | ICD-10-CM | POA: Diagnosis not present

## 2020-10-31 DIAGNOSIS — J9621 Acute and chronic respiratory failure with hypoxia: Secondary | ICD-10-CM | POA: Diagnosis not present

## 2020-10-31 DIAGNOSIS — Z7902 Long term (current) use of antithrombotics/antiplatelets: Secondary | ICD-10-CM | POA: Diagnosis not present

## 2020-10-31 DIAGNOSIS — I110 Hypertensive heart disease with heart failure: Secondary | ICD-10-CM | POA: Diagnosis not present

## 2020-11-05 DIAGNOSIS — C50911 Malignant neoplasm of unspecified site of right female breast: Secondary | ICD-10-CM | POA: Diagnosis not present

## 2020-11-06 DIAGNOSIS — I509 Heart failure, unspecified: Secondary | ICD-10-CM | POA: Diagnosis not present

## 2020-11-07 DIAGNOSIS — Z7951 Long term (current) use of inhaled steroids: Secondary | ICD-10-CM | POA: Diagnosis not present

## 2020-11-07 DIAGNOSIS — I110 Hypertensive heart disease with heart failure: Secondary | ICD-10-CM | POA: Diagnosis not present

## 2020-11-07 DIAGNOSIS — Z8673 Personal history of transient ischemic attack (TIA), and cerebral infarction without residual deficits: Secondary | ICD-10-CM | POA: Diagnosis not present

## 2020-11-07 DIAGNOSIS — Z7902 Long term (current) use of antithrombotics/antiplatelets: Secondary | ICD-10-CM | POA: Diagnosis not present

## 2020-11-07 DIAGNOSIS — Z7982 Long term (current) use of aspirin: Secondary | ICD-10-CM | POA: Diagnosis not present

## 2020-11-07 DIAGNOSIS — I5032 Chronic diastolic (congestive) heart failure: Secondary | ICD-10-CM | POA: Diagnosis not present

## 2020-11-07 DIAGNOSIS — J9621 Acute and chronic respiratory failure with hypoxia: Secondary | ICD-10-CM | POA: Diagnosis not present

## 2020-11-07 DIAGNOSIS — I2510 Atherosclerotic heart disease of native coronary artery without angina pectoris: Secondary | ICD-10-CM | POA: Diagnosis not present

## 2020-11-07 DIAGNOSIS — I5023 Acute on chronic systolic (congestive) heart failure: Secondary | ICD-10-CM | POA: Diagnosis not present

## 2020-11-07 DIAGNOSIS — C50411 Malignant neoplasm of upper-outer quadrant of right female breast: Secondary | ICD-10-CM | POA: Diagnosis not present

## 2020-11-07 DIAGNOSIS — Z9981 Dependence on supplemental oxygen: Secondary | ICD-10-CM | POA: Diagnosis not present

## 2020-11-11 NOTE — Progress Notes (Signed)
[facility]  [address] [place],  [place]  [address] [phone]  Clinic Day:  11/15/2020  Referring physician: Nicoletta Dress, MD  This document serves as a record of services personally performed by Hosie Poisson, MD. It was created on their behalf by [facility], a trained medical scribe. The creation of this record is based on the scribe's personal observations and the provider's statements to them.  CHIEF COMPLAINT:  CC:  Stage II B hormone receptor positive breast cancer and age-related osteoporosis  Current Treatment:    Prolia every 6 months   HISTORY OF PRESENT ILLNESS:  JAELIANA LOCOCO is a 87 y.o. 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  She was scheduled for a right partial mastectomy in February 2018, but had an acute myocardial infarction and stroke.  She required placement of 2 coronary artery stents.  The surgery was delayed until August.  Final pathology revealed multifocal invasive ductal carcinoma with a 2.4 cm lesion and a 1.2 cm lesion, both grade 2 with clear margins, as well as intermediate grade ductal carcinoma in situ.  One of 2 nodes was positive with extracapsular extension and lymphovascular invasion.  Estrogen and progesterone receptors were positive and her 2 Neu was negative.  Ki 67 was 10%.  Due to her other medical comorbidities, we did not feel she was a candidate for aggressive chemotherapy.  She was placed on anastrozole 1 mg daily in September 2018.  Bone density scan in September 2018 revealed osteoporosis with a statistically significant decrease in the bone mineral density compared to the prior study of 2005. Her T-score of the right femur was -3.6, which was 6.8% worse, with a T-score of -3.2 of the right forearm.  She was already taking calcium 600 mg twice daily.  Due to the risk of worsening osteopenia with anastrozole, she was also placed  on alendronate 70 mg weekly.  She went to the hospital in early 2020 twice for bleeding ulcers. She had four blood transfusions.  She was due for mammography in May, but this was delayed due to COVID-19.  Bilateral diagnostic mammogram in August of 2020 revealed a 3 cm area of pleomorphic calcifications in the left breast.  There was no evidence of malignancy in the right breast.  Biopsy of the left breast lesion revealed focal atypical ductal hyperplasia with calcifications, with no in situ or invasive malignancy identified.  She underwent left lumpectomy in September.  Final pathology revealed atypical ductal hyperplasia and florid usual ductal hyperplasia with calcifications.  No in situ or invasive malignancy was identified.  Margins were clear.  Bone density from September 2020 revealed worsening osteoporosis with a T-score of -3.6 of the left femur, previously -3.4.  The right forearm measured -4.3, previously -3.2.  We therefore stopped the anastrozole after 2 years and switched to raloxifene for chemoprevention, which can also also treat her osteoporosis.  She was  placed on Prolia injections every 6 months for osteoporosis.  Due to severe arthralgias affecting her quality of life, we discontinued raloxifene in March 2021. She presented to the emergency department in mid September, and then again on October 2nd due to acute kidney injury.  CT urogram from October revealed a 7 mm obstructing proximal right ureteral calculus at the L3-4 level, with  moderate right hydronephrosis, and a 16 mm calculus in the left renal pelvis. There were additional nonobstructing bilateral renal calculi, and  a 17 mm hyperdense lesion in the anterior right upper kidney, possibly reflecting a hemorrhagic cyst.  She saw Dr. Nila Nephew he discontinued calcium and vitamin D.  She was scheduled for lithotripsy.    Annual bilateral mammogram and ultrasound from August 2022 revealed an interval 1.7 x 1.5 x 0.4 cm irregular mass with  pleomorphic calcifications in the skin and underlying breast in the 10 o'clock position of the right breast.  No evidence of malignancy elsewhere in either breast.  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  Tumor measures 1.5 cm and is invasive within dermis and subcutaneous tissue.  Skin ulceration, lymphovascular invasion and perineural invasion identified.  Margins free of neoplasm.  This stages her as a stage I (T1c N0 M0).  HER2 was equivocal 2+, but negative by FISH.  Estrogen receptor was positive at 100% and progesterone receptor was positive at 5%.  Ki67 was 15%.   INTERVAL HISTORY:  Stanton Kidney is here for routine follow up prior to her next Prolia. Bone density scan has been rescheduled for December 14th. Blood counts and chemistries are unremarkable except for a BUN of 29, improved, and a creatinine of 1.5, previously 1.3. Her calcium was 10.2. Her  appetite is good, and she has lost 2 pounds since her last visit.  She denies fever, chills or other signs of infection.  She denies nausea, vomiting, bowel issues, or abdominal pain.  She denies sore throat, cough, dyspnea, or chest pain.  REVIEW OF SYSTEMS:  Review of Systems  Constitutional: Negative.  Negative for appetite change, chills, fatigue, fever and unexpected weight change.  HENT:  Negative.    Eyes: Negative.   Respiratory: Negative.  Negative for chest tightness, cough, hemoptysis, shortness of breath and wheezing.   Cardiovascular: Negative.  Negative for chest pain, leg swelling and palpitations.  Gastrointestinal: Negative.  Negative for abdominal distention, abdominal pain, blood in stool, constipation, diarrhea, nausea and vomiting.  Endocrine: Negative.   Genitourinary: Negative.  Negative for difficulty urinating, dysuria, frequency and hematuria.   Musculoskeletal: Negative.  Negative for  arthralgias, back pain, flank pain, gait problem and myalgias.  Skin: Negative.   Neurological: Negative.  Negative for dizziness, extremity weakness, gait problem, headaches, light-headedness, numbness, seizures and speech difficulty.  Hematological: Negative.   Psychiatric/Behavioral: Negative.  Negative for depression and sleep disturbance. The patient is not nervous/anxious.    VITALS:  Blood pressure 126/62, pulse 69, temperature 98.4 F (36.9 C), temperature source Oral, resp. rate 18, height $RemoveBe'5\' 4"'fmTTzCOhf$  (1.626 m), weight 124 lb 9.6 oz (56.5 kg), SpO2 97 %.  Wt Readings from Last 3 Encounters:  11/15/20 124 lb 9.6 oz (56.5 kg)  10/25/20 125 lb 3.2 oz (56.8 kg)  10/02/20 126 lb 12.8 oz (57.5 kg)    Body mass index is 21.39 kg/m.  Performance status (ECOG): 0 - Asymptomatic  PHYSICAL EXAM:  Physical Exam Constitutional:      General: She is not in acute distress.    Appearance: Normal appearance. She is normal weight.  HENT:     Head: Normocephalic and atraumatic.  Eyes:     General: No scleral icterus.    Extraocular Movements: Extraocular movements intact.     Conjunctiva/sclera: Conjunctivae normal.     Pupils: Pupils are equal, round, and reactive to light.  Cardiovascular:     Rate and Rhythm: Normal rate and regular rhythm.  Pulses: Normal pulses.     Heart sounds: Normal heart sounds. No murmur heard.   No friction rub. No gallop.  Pulmonary:     Effort: Pulmonary effort is normal. No respiratory distress.     Breath sounds: Normal breath sounds.  Chest:     Comments: Tiny, small, smooth and round nodule, just lateral to the left nipple. Healing incision in the upper outer quadrant of the right breast. No masses in either breast. Abdominal:     General: Bowel sounds are normal. There is no distension.     Palpations: Abdomen is soft. There is no hepatomegaly, splenomegaly or mass.     Tenderness: There is no abdominal tenderness.  Musculoskeletal:        General:  Normal range of motion.     Cervical back: Normal range of motion and neck supple.     Right lower leg: No edema.     Left lower leg: No edema.  Lymphadenopathy:     Cervical: No cervical adenopathy.  Skin:    General: Skin is warm and dry.  Neurological:     General: No focal deficit present.     Mental Status: She is alert and oriented to person, place, and time. Mental status is at baseline.  Psychiatric:        Mood and Affect: Mood normal.        Behavior: Behavior normal.        Thought Content: Thought content normal.        Judgment: Judgment normal.   LABS:   CBC Latest Ref Rng & Units 11/15/2020 10/02/2020 05/17/2020  WBC - 7.9 8.5 7.8  Hemoglobin 12.0 - 16.0 12.9 13.3 11.5(A)  Hematocrit 36 - 46 39 41 36  Platelets 150 - 399 274 320 272   CMP Latest Ref Rng & Units 11/15/2020 10/25/2020 10/02/2020  Glucose 65 - 99 mg/dL - 89 -  BUN 4 - 21 29(A) 36(H) 39(A)  Creatinine 0.5 - 1.1 1.5(A) 1.29(H) 1.3(A)  Sodium 137 - 147 141 139 139  Potassium 3.4 - 5.3 3.8 4.6 4.1  Chloride 99 - 108 102 99 99  CO2 13 - 22 31(A) 27 29(A)  Calcium 8.7 - 10.7 9.3 10.4(H) 10.2  Total Protein 6.5 - 8.1 g/dL - - -  Total Bilirubin 0.3 - 1.2 mg/dL - - -  Alkaline Phos 25 - 125 59 - 49  AST 13 - 35 32 - 37(A)  ALT 7 - 35 14 - 14    STUDIES:  No results found.   HISTORY:   Allergies:  Allergies  Allergen Reactions   Iodinated Diagnostic Agents Rash   Ticagrelor Rash   Statins Other (See Comments)    myalgias     Current Medications: Current Outpatient Medications  Medication Sig Dispense Refill   aspirin EC 81 MG EC tablet Take 1 tablet (81 mg total) by mouth daily. Swallow whole. 30 tablet 11   clopidogrel (PLAVIX) 75 MG tablet Take 75 mg by mouth daily.     fluticasone (FLONASE) 50 MCG/ACT nasal spray Place 2 sprays into both nostrils daily.     furosemide (LASIX) 20 MG tablet Take 1 tablet by mouth once daily 90 tablet 3   levothyroxine (SYNTHROID) 50 MCG tablet Take 50  mcg by mouth daily before breakfast.     MAGNESIUM CITRATE PO Take 400 mg by mouth in the morning, at noon, and at bedtime.     metoprolol tartrate (LOPRESSOR) 25 MG tablet Take  25 mg by mouth 2 (two) times daily. Pt takes a total of 75 mg daily     metoprolol tartrate (LOPRESSOR) 50 MG tablet Take 50 mg by mouth 2 (two) times daily. Pt takes a total of 75 mg daily     nitroGLYCERIN (NITROSTAT) 0.4 MG SL tablet Place 0.4 mg under the tongue every 5 (five) minutes as needed for chest pain.     ondansetron (ZOFRAN ODT) 4 MG disintegrating tablet Take 1 tablet (4 mg total) by mouth every 8 (eight) hours as needed for nausea or vomiting. 20 tablet 0   pantoprazole (PROTONIX) 40 MG tablet Take 40 mg by mouth daily.     promethazine (PHENERGAN) 12.5 MG tablet Take 12.5 mg by mouth 4 (four) times daily as needed for nausea/vomiting.     Red Yeast Rice 600 MG CAPS Take 600 mg by mouth in the morning and at bedtime.     spironolactone (ALDACTONE) 25 MG tablet Take 0.5 tablets (12.5 mg total) by mouth daily. 30 tablet 0   traMADol (ULTRAM) 50 MG tablet Take 50 mg by mouth every 6 (six) hours as needed for severe pain.     No current facility-administered medications for this visit.     ASSESSMENT & PLAN:   Assessment:  1. Stage IIB hormone receptor positive right breast cancer, January 2018 treated with lumpectomy and adjuvant hormonal therapy for about 3 years.    2. Atypical ductal hyperplasia of the left breast, August 2020, status post lumpectomy.  3.  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  She underwent excisional biopsy with Dr. Lilia Pro.  HER2 was equivocal 2+, but negative by FISH.  Estrogen receptor was positive at 100% and progesterone receptor was positive at 5%.  Ki67 was 15%.  We have had several discussions regarding risks and benefit of hormonal therapy for this frail elderly woman with osteoporosis. At this time,  we will not pursue hormonal therapy. The margins were clear, but she is at high risk for recurrence due to the perineural and lymphovascular invasion.   3. Worsening osteoporosis, despite alendronate.  She is now on Prolia every 6 months and will proceed with her next injection on October 18th. Calcium and vitamin D have been discontinued due to significant renal lithiasis.  She is scheduled for repeat bone density in December.  4. Chronic kidney disease, which is mildly worse.    5. Bilateral renal lithiasis, she has not currently having symptoms.  Plan:   She will proceed with Prolia on October 18th. I am concerned about placing her on aromatase inhibitor due to her osteoporosis.  We will obtain repeat bone density in December before making a final decision on treatment.  We will plan to see her back in January for repeat evaluation.  Since this is a stage IA, she may be best served by observation only in view of her age and co-morbidities. The patient and her daughter understand the plans discussed today and are in agreement with them.  She knows to contact our office if she develops concerns prior to her next appointment.  She will be due for labs and Prolia again in 6 months.   I, Rita Ohara, am acting as scribe for Derwood Kaplan, MD  I have reviewed this report as typed by the medical scribe, and it is complete and accurate.

## 2020-11-12 ENCOUNTER — Encounter: Payer: Self-pay | Admitting: Oncology

## 2020-11-14 DIAGNOSIS — I5023 Acute on chronic systolic (congestive) heart failure: Secondary | ICD-10-CM | POA: Diagnosis not present

## 2020-11-14 DIAGNOSIS — Z7902 Long term (current) use of antithrombotics/antiplatelets: Secondary | ICD-10-CM | POA: Diagnosis not present

## 2020-11-14 DIAGNOSIS — Z9981 Dependence on supplemental oxygen: Secondary | ICD-10-CM | POA: Diagnosis not present

## 2020-11-14 DIAGNOSIS — Z8673 Personal history of transient ischemic attack (TIA), and cerebral infarction without residual deficits: Secondary | ICD-10-CM | POA: Diagnosis not present

## 2020-11-14 DIAGNOSIS — Z7982 Long term (current) use of aspirin: Secondary | ICD-10-CM | POA: Diagnosis not present

## 2020-11-14 DIAGNOSIS — J9621 Acute and chronic respiratory failure with hypoxia: Secondary | ICD-10-CM | POA: Diagnosis not present

## 2020-11-14 DIAGNOSIS — Z7951 Long term (current) use of inhaled steroids: Secondary | ICD-10-CM | POA: Diagnosis not present

## 2020-11-14 DIAGNOSIS — C50411 Malignant neoplasm of upper-outer quadrant of right female breast: Secondary | ICD-10-CM | POA: Diagnosis not present

## 2020-11-14 DIAGNOSIS — I110 Hypertensive heart disease with heart failure: Secondary | ICD-10-CM | POA: Diagnosis not present

## 2020-11-14 DIAGNOSIS — I5032 Chronic diastolic (congestive) heart failure: Secondary | ICD-10-CM | POA: Diagnosis not present

## 2020-11-14 DIAGNOSIS — I2510 Atherosclerotic heart disease of native coronary artery without angina pectoris: Secondary | ICD-10-CM | POA: Diagnosis not present

## 2020-11-15 ENCOUNTER — Encounter: Payer: Self-pay | Admitting: Oncology

## 2020-11-15 ENCOUNTER — Inpatient Hospital Stay: Payer: Medicare Other

## 2020-11-15 ENCOUNTER — Ambulatory Visit: Payer: Medicare Other | Admitting: Oncology

## 2020-11-15 ENCOUNTER — Inpatient Hospital Stay: Payer: Medicare Other | Attending: Oncology | Admitting: Oncology

## 2020-11-15 ENCOUNTER — Other Ambulatory Visit: Payer: Self-pay | Admitting: Hematology and Oncology

## 2020-11-15 ENCOUNTER — Other Ambulatory Visit: Payer: Medicare Other

## 2020-11-15 ENCOUNTER — Telehealth: Payer: Self-pay | Admitting: Oncology

## 2020-11-15 VITALS — BP 126/62 | HR 69 | Temp 98.4°F | Resp 18 | Ht 64.0 in | Wt 124.6 lb

## 2020-11-15 DIAGNOSIS — N6311 Unspecified lump in the right breast, upper outer quadrant: Secondary | ICD-10-CM | POA: Insufficient documentation

## 2020-11-15 DIAGNOSIS — Z170 Estrogen receptor positive status [ER+]: Secondary | ICD-10-CM | POA: Insufficient documentation

## 2020-11-15 DIAGNOSIS — D649 Anemia, unspecified: Secondary | ICD-10-CM | POA: Diagnosis not present

## 2020-11-15 DIAGNOSIS — N132 Hydronephrosis with renal and ureteral calculous obstruction: Secondary | ICD-10-CM | POA: Insufficient documentation

## 2020-11-15 DIAGNOSIS — C50411 Malignant neoplasm of upper-outer quadrant of right female breast: Secondary | ICD-10-CM | POA: Diagnosis not present

## 2020-11-15 DIAGNOSIS — M810 Age-related osteoporosis without current pathological fracture: Secondary | ICD-10-CM

## 2020-11-15 DIAGNOSIS — Z955 Presence of coronary angioplasty implant and graft: Secondary | ICD-10-CM | POA: Insufficient documentation

## 2020-11-15 DIAGNOSIS — N189 Chronic kidney disease, unspecified: Secondary | ICD-10-CM | POA: Insufficient documentation

## 2020-11-15 DIAGNOSIS — Z888 Allergy status to other drugs, medicaments and biological substances status: Secondary | ICD-10-CM | POA: Insufficient documentation

## 2020-11-15 DIAGNOSIS — I252 Old myocardial infarction: Secondary | ICD-10-CM | POA: Insufficient documentation

## 2020-11-15 DIAGNOSIS — Z79811 Long term (current) use of aromatase inhibitors: Secondary | ICD-10-CM | POA: Insufficient documentation

## 2020-11-15 LAB — BASIC METABOLIC PANEL
BUN: 29 — AB (ref 4–21)
CO2: 31 — AB (ref 13–22)
Chloride: 102 (ref 99–108)
Creatinine: 1.5 — AB (ref 0.5–1.1)
Glucose: 87
Potassium: 3.8 (ref 3.4–5.3)
Sodium: 141 (ref 137–147)

## 2020-11-15 LAB — CBC AND DIFFERENTIAL
HCT: 39 (ref 36–46)
Hemoglobin: 12.9 (ref 12.0–16.0)
Neutrophils Absolute: 5.61
Platelets: 274 (ref 150–399)
WBC: 7.9

## 2020-11-15 LAB — HEPATIC FUNCTION PANEL
ALT: 14 (ref 7–35)
AST: 32 (ref 13–35)
Alkaline Phosphatase: 59 (ref 25–125)
Bilirubin, Total: 0.6

## 2020-11-15 LAB — COMPREHENSIVE METABOLIC PANEL
Albumin: 4 (ref 3.5–5.0)
Calcium: 9.3 (ref 8.7–10.7)

## 2020-11-15 LAB — CBC: RBC: 4.29 (ref 3.87–5.11)

## 2020-11-15 NOTE — Telephone Encounter (Signed)
Per 10/14 LOS, patient scheduled for Jan 2023 Appt.  Gave patient Appt Summary

## 2020-11-19 ENCOUNTER — Other Ambulatory Visit: Payer: Self-pay

## 2020-11-19 ENCOUNTER — Inpatient Hospital Stay: Payer: Medicare Other

## 2020-11-19 VITALS — BP 145/86 | HR 72 | Temp 97.7°F | Resp 18 | Ht 64.0 in | Wt 124.0 lb

## 2020-11-19 DIAGNOSIS — Z955 Presence of coronary angioplasty implant and graft: Secondary | ICD-10-CM | POA: Diagnosis not present

## 2020-11-19 DIAGNOSIS — Z888 Allergy status to other drugs, medicaments and biological substances status: Secondary | ICD-10-CM | POA: Diagnosis not present

## 2020-11-19 DIAGNOSIS — N6311 Unspecified lump in the right breast, upper outer quadrant: Secondary | ICD-10-CM | POA: Diagnosis not present

## 2020-11-19 DIAGNOSIS — C50411 Malignant neoplasm of upper-outer quadrant of right female breast: Secondary | ICD-10-CM | POA: Diagnosis not present

## 2020-11-19 DIAGNOSIS — N189 Chronic kidney disease, unspecified: Secondary | ICD-10-CM | POA: Diagnosis not present

## 2020-11-19 DIAGNOSIS — M810 Age-related osteoporosis without current pathological fracture: Secondary | ICD-10-CM

## 2020-11-19 DIAGNOSIS — I252 Old myocardial infarction: Secondary | ICD-10-CM | POA: Diagnosis not present

## 2020-11-19 DIAGNOSIS — Z79811 Long term (current) use of aromatase inhibitors: Secondary | ICD-10-CM | POA: Diagnosis not present

## 2020-11-19 DIAGNOSIS — N132 Hydronephrosis with renal and ureteral calculous obstruction: Secondary | ICD-10-CM | POA: Diagnosis not present

## 2020-11-19 DIAGNOSIS — Z170 Estrogen receptor positive status [ER+]: Secondary | ICD-10-CM | POA: Diagnosis not present

## 2020-11-19 MED ORDER — DENOSUMAB 60 MG/ML ~~LOC~~ SOSY
60.0000 mg | PREFILLED_SYRINGE | Freq: Once | SUBCUTANEOUS | Status: AC
  Administered 2020-11-19: 60 mg via SUBCUTANEOUS
  Filled 2020-11-19: qty 1

## 2020-11-19 MED ORDER — SODIUM CHLORIDE 0.9 % IV SOLN: Freq: Once | INTRAVENOUS | Status: DC

## 2020-11-19 NOTE — Patient Instructions (Signed)
Denosumab injection What is this medication? DENOSUMAB (den oh sue mab) slows bone breakdown. Prolia is used to treat osteoporosis in women after menopause and in men, and in people who are taking corticosteroids for 6 months or more. Xgeva is used to treat a high calcium level due to cancer and to prevent bone fractures and other bone problems caused by multiple myeloma or cancer bone metastases. Xgeva is also used to treat giant cell tumor of the bone. This medicine may be used for other purposes; ask your health care provider or pharmacist if you have questions. COMMON BRAND NAME(S): Prolia, XGEVA What should I tell my care team before I take this medication? They need to know if you have any of these conditions: dental disease having surgery or tooth extraction infection kidney disease low levels of calcium or Vitamin D in the blood malnutrition on hemodialysis skin conditions or sensitivity thyroid or parathyroid disease an unusual reaction to denosumab, other medicines, foods, dyes, or preservatives pregnant or trying to get pregnant breast-feeding How should I use this medication? This medicine is for injection under the skin. It is given by a health care professional in a hospital or clinic setting. A special MedGuide will be given to you before each treatment. Be sure to read this information carefully each time. For Prolia, talk to your pediatrician regarding the use of this medicine in children. Special care may be needed. For Xgeva, talk to your pediatrician regarding the use of this medicine in children. While this drug may be prescribed for children as young as 13 years for selected conditions, precautions do apply. Overdosage: If you think you have taken too much of this medicine contact a poison control center or emergency room at once. NOTE: This medicine is only for you. Do not share this medicine with others. What if I miss a dose? It is important not to miss your dose.  Call your doctor or health care professional if you are unable to keep an appointment. What may interact with this medication? Do not take this medicine with any of the following medications: other medicines containing denosumab This medicine may also interact with the following medications: medicines that lower your chance of fighting infection steroid medicines like prednisone or cortisone This list may not describe all possible interactions. Give your health care provider a list of all the medicines, herbs, non-prescription drugs, or dietary supplements you use. Also tell them if you smoke, drink alcohol, or use illegal drugs. Some items may interact with your medicine. What should I watch for while using this medication? Visit your doctor or health care professional for regular checks on your progress. Your doctor or health care professional may order blood tests and other tests to see how you are doing. Call your doctor or health care professional for advice if you get a fever, chills or sore throat, or other symptoms of a cold or flu. Do not treat yourself. This drug may decrease your body's ability to fight infection. Try to avoid being around people who are sick. You should make sure you get enough calcium and vitamin D while you are taking this medicine, unless your doctor tells you not to. Discuss the foods you eat and the vitamins you take with your health care professional. See your dentist regularly. Brush and floss your teeth as directed. Before you have any dental work done, tell your dentist you are receiving this medicine. Do not become pregnant while taking this medicine or for 5 months after   stopping it. Talk with your doctor or health care professional about your birth control options while taking this medicine. Women should inform their doctor if they wish to become pregnant or think they might be pregnant. There is a potential for serious side effects to an unborn child. Talk to  your health care professional or pharmacist for more information. What side effects may I notice from receiving this medication? Side effects that you should report to your doctor or health care professional as soon as possible: allergic reactions like skin rash, itching or hives, swelling of the face, lips, or tongue bone pain breathing problems dizziness jaw pain, especially after dental work redness, blistering, peeling of the skin signs and symptoms of infection like fever or chills; cough; sore throat; pain or trouble passing urine signs of low calcium like fast heartbeat, muscle cramps or muscle pain; pain, tingling, numbness in the hands or feet; seizures unusual bleeding or bruising unusually weak or tired Side effects that usually do not require medical attention (report to your doctor or health care professional if they continue or are bothersome): constipation diarrhea headache joint pain loss of appetite muscle pain runny nose tiredness upset stomach This list may not describe all possible side effects. Call your doctor for medical advice about side effects. You may report side effects to FDA at 1-800-FDA-1088. Where should I keep my medication? This medicine is only given in a clinic, doctor's office, or other health care setting and will not be stored at home. NOTE: This sheet is a summary. It may not cover all possible information. If you have questions about this medicine, talk to your doctor, pharmacist, or health care provider.  2022 Elsevier/Gold Standard (2017-05-28 16:10:44)

## 2020-11-19 NOTE — Progress Notes (Signed)
Discharged home, stable  

## 2020-11-21 DIAGNOSIS — I5023 Acute on chronic systolic (congestive) heart failure: Secondary | ICD-10-CM | POA: Diagnosis not present

## 2020-11-21 DIAGNOSIS — Z7982 Long term (current) use of aspirin: Secondary | ICD-10-CM | POA: Diagnosis not present

## 2020-11-21 DIAGNOSIS — Z8673 Personal history of transient ischemic attack (TIA), and cerebral infarction without residual deficits: Secondary | ICD-10-CM | POA: Diagnosis not present

## 2020-11-21 DIAGNOSIS — I110 Hypertensive heart disease with heart failure: Secondary | ICD-10-CM | POA: Diagnosis not present

## 2020-11-21 DIAGNOSIS — Z7902 Long term (current) use of antithrombotics/antiplatelets: Secondary | ICD-10-CM | POA: Diagnosis not present

## 2020-11-21 DIAGNOSIS — J9621 Acute and chronic respiratory failure with hypoxia: Secondary | ICD-10-CM | POA: Diagnosis not present

## 2020-11-21 DIAGNOSIS — Z9981 Dependence on supplemental oxygen: Secondary | ICD-10-CM | POA: Diagnosis not present

## 2020-11-21 DIAGNOSIS — Z7951 Long term (current) use of inhaled steroids: Secondary | ICD-10-CM | POA: Diagnosis not present

## 2020-11-21 DIAGNOSIS — I5032 Chronic diastolic (congestive) heart failure: Secondary | ICD-10-CM | POA: Diagnosis not present

## 2020-11-21 DIAGNOSIS — C50411 Malignant neoplasm of upper-outer quadrant of right female breast: Secondary | ICD-10-CM | POA: Diagnosis not present

## 2020-11-21 DIAGNOSIS — I2510 Atherosclerotic heart disease of native coronary artery without angina pectoris: Secondary | ICD-10-CM | POA: Diagnosis not present

## 2020-11-22 ENCOUNTER — Encounter: Payer: Self-pay | Admitting: Oncology

## 2020-11-28 DIAGNOSIS — I5032 Chronic diastolic (congestive) heart failure: Secondary | ICD-10-CM | POA: Diagnosis not present

## 2020-11-28 DIAGNOSIS — Z7902 Long term (current) use of antithrombotics/antiplatelets: Secondary | ICD-10-CM | POA: Diagnosis not present

## 2020-11-28 DIAGNOSIS — Z7982 Long term (current) use of aspirin: Secondary | ICD-10-CM | POA: Diagnosis not present

## 2020-11-28 DIAGNOSIS — I5023 Acute on chronic systolic (congestive) heart failure: Secondary | ICD-10-CM | POA: Diagnosis not present

## 2020-11-28 DIAGNOSIS — Z8673 Personal history of transient ischemic attack (TIA), and cerebral infarction without residual deficits: Secondary | ICD-10-CM | POA: Diagnosis not present

## 2020-11-28 DIAGNOSIS — C50411 Malignant neoplasm of upper-outer quadrant of right female breast: Secondary | ICD-10-CM | POA: Diagnosis not present

## 2020-11-28 DIAGNOSIS — I110 Hypertensive heart disease with heart failure: Secondary | ICD-10-CM | POA: Diagnosis not present

## 2020-11-28 DIAGNOSIS — Z7951 Long term (current) use of inhaled steroids: Secondary | ICD-10-CM | POA: Diagnosis not present

## 2020-11-28 DIAGNOSIS — I2510 Atherosclerotic heart disease of native coronary artery without angina pectoris: Secondary | ICD-10-CM | POA: Diagnosis not present

## 2020-11-28 DIAGNOSIS — Z9981 Dependence on supplemental oxygen: Secondary | ICD-10-CM | POA: Diagnosis not present

## 2020-11-28 DIAGNOSIS — J9621 Acute and chronic respiratory failure with hypoxia: Secondary | ICD-10-CM | POA: Diagnosis not present

## 2020-12-05 DIAGNOSIS — I5032 Chronic diastolic (congestive) heart failure: Secondary | ICD-10-CM | POA: Diagnosis not present

## 2020-12-05 DIAGNOSIS — E785 Hyperlipidemia, unspecified: Secondary | ICD-10-CM | POA: Diagnosis not present

## 2020-12-05 DIAGNOSIS — I110 Hypertensive heart disease with heart failure: Secondary | ICD-10-CM | POA: Diagnosis not present

## 2020-12-05 DIAGNOSIS — I2510 Atherosclerotic heart disease of native coronary artery without angina pectoris: Secondary | ICD-10-CM | POA: Diagnosis not present

## 2020-12-05 DIAGNOSIS — I5023 Acute on chronic systolic (congestive) heart failure: Secondary | ICD-10-CM | POA: Diagnosis not present

## 2020-12-05 DIAGNOSIS — Z7951 Long term (current) use of inhaled steroids: Secondary | ICD-10-CM | POA: Diagnosis not present

## 2020-12-05 DIAGNOSIS — Z8673 Personal history of transient ischemic attack (TIA), and cerebral infarction without residual deficits: Secondary | ICD-10-CM | POA: Diagnosis not present

## 2020-12-05 DIAGNOSIS — J9621 Acute and chronic respiratory failure with hypoxia: Secondary | ICD-10-CM | POA: Diagnosis not present

## 2020-12-05 DIAGNOSIS — L309 Dermatitis, unspecified: Secondary | ICD-10-CM | POA: Diagnosis not present

## 2020-12-05 DIAGNOSIS — I350 Nonrheumatic aortic (valve) stenosis: Secondary | ICD-10-CM | POA: Diagnosis not present

## 2020-12-05 DIAGNOSIS — Z7982 Long term (current) use of aspirin: Secondary | ICD-10-CM | POA: Diagnosis not present

## 2020-12-05 DIAGNOSIS — Z7902 Long term (current) use of antithrombotics/antiplatelets: Secondary | ICD-10-CM | POA: Diagnosis not present

## 2020-12-05 DIAGNOSIS — C50411 Malignant neoplasm of upper-outer quadrant of right female breast: Secondary | ICD-10-CM | POA: Diagnosis not present

## 2020-12-05 DIAGNOSIS — Z9981 Dependence on supplemental oxygen: Secondary | ICD-10-CM | POA: Diagnosis not present

## 2020-12-05 DIAGNOSIS — I739 Peripheral vascular disease, unspecified: Secondary | ICD-10-CM | POA: Diagnosis not present

## 2020-12-07 DIAGNOSIS — I509 Heart failure, unspecified: Secondary | ICD-10-CM | POA: Diagnosis not present

## 2020-12-09 ENCOUNTER — Other Ambulatory Visit: Payer: Self-pay | Admitting: Sports Medicine

## 2020-12-09 ENCOUNTER — Telehealth: Payer: Self-pay

## 2020-12-09 MED ORDER — DOXYCYCLINE HYCLATE 100 MG PO TABS: 100.0000 mg | ORAL_TABLET | Freq: Two times a day (BID) | ORAL | 0 refills | Status: DC

## 2020-12-09 NOTE — Progress Notes (Signed)
Doxycyline antibiotic sent

## 2020-12-09 NOTE — Telephone Encounter (Signed)
Daughter was notified of abx sent to pharmacy

## 2020-12-09 NOTE — Telephone Encounter (Signed)
Pt's daughter called requesting an abx for the pt prior to her appt on Friday. Daughter states wound is not looking good and nurse suggested and abx. Please advice

## 2020-12-12 DIAGNOSIS — I739 Peripheral vascular disease, unspecified: Secondary | ICD-10-CM | POA: Diagnosis not present

## 2020-12-12 DIAGNOSIS — E785 Hyperlipidemia, unspecified: Secondary | ICD-10-CM | POA: Diagnosis not present

## 2020-12-12 DIAGNOSIS — Z7982 Long term (current) use of aspirin: Secondary | ICD-10-CM | POA: Diagnosis not present

## 2020-12-12 DIAGNOSIS — Z9981 Dependence on supplemental oxygen: Secondary | ICD-10-CM | POA: Diagnosis not present

## 2020-12-12 DIAGNOSIS — I5023 Acute on chronic systolic (congestive) heart failure: Secondary | ICD-10-CM | POA: Diagnosis not present

## 2020-12-12 DIAGNOSIS — L309 Dermatitis, unspecified: Secondary | ICD-10-CM | POA: Diagnosis not present

## 2020-12-12 DIAGNOSIS — I2510 Atherosclerotic heart disease of native coronary artery without angina pectoris: Secondary | ICD-10-CM | POA: Diagnosis not present

## 2020-12-12 DIAGNOSIS — J9621 Acute and chronic respiratory failure with hypoxia: Secondary | ICD-10-CM | POA: Diagnosis not present

## 2020-12-12 DIAGNOSIS — I110 Hypertensive heart disease with heart failure: Secondary | ICD-10-CM | POA: Diagnosis not present

## 2020-12-12 DIAGNOSIS — C50411 Malignant neoplasm of upper-outer quadrant of right female breast: Secondary | ICD-10-CM | POA: Diagnosis not present

## 2020-12-12 DIAGNOSIS — I5032 Chronic diastolic (congestive) heart failure: Secondary | ICD-10-CM | POA: Diagnosis not present

## 2020-12-12 DIAGNOSIS — I350 Nonrheumatic aortic (valve) stenosis: Secondary | ICD-10-CM | POA: Diagnosis not present

## 2020-12-12 DIAGNOSIS — Z7951 Long term (current) use of inhaled steroids: Secondary | ICD-10-CM | POA: Diagnosis not present

## 2020-12-12 DIAGNOSIS — Z7902 Long term (current) use of antithrombotics/antiplatelets: Secondary | ICD-10-CM | POA: Diagnosis not present

## 2020-12-12 DIAGNOSIS — Z8673 Personal history of transient ischemic attack (TIA), and cerebral infarction without residual deficits: Secondary | ICD-10-CM | POA: Diagnosis not present

## 2020-12-13 ENCOUNTER — Encounter: Payer: Self-pay | Admitting: Sports Medicine

## 2020-12-13 ENCOUNTER — Ambulatory Visit: Payer: Medicare Other | Admitting: Sports Medicine

## 2020-12-13 DIAGNOSIS — L84 Corns and callosities: Secondary | ICD-10-CM | POA: Diagnosis not present

## 2020-12-13 DIAGNOSIS — I739 Peripheral vascular disease, unspecified: Secondary | ICD-10-CM

## 2020-12-13 DIAGNOSIS — L309 Dermatitis, unspecified: Secondary | ICD-10-CM

## 2020-12-13 DIAGNOSIS — R234 Changes in skin texture: Secondary | ICD-10-CM

## 2020-12-13 MED ORDER — LAMISIL AT SPRAY 1 % EX SOLN: CUTANEOUS | 0 refills | Status: DC

## 2020-12-13 MED ORDER — CLOTRIMAZOLE-BETAMETHASONE 1-0.05 % EX CREA: TOPICAL_CREAM | Freq: Two times a day (BID) | CUTANEOUS | 0 refills | Status: DC

## 2020-12-13 NOTE — Progress Notes (Signed)
Subjective: Olivia [name] is a 87 y.o. female patient who returns to the office for follow-up wound care. Patient is assisted by daughter who reports that she has noticed a new area of rash at the foot as well as a new spot in between her left first and second toes.  Reports that the right great toe is still doing really good with dry skin to the area.  Denies nausea vomiting fever chills or any constitutional symptoms at this time.  Patient Active Problem List   Diagnosis Date Noted   Breast cancer [facility])    CAD (coronary artery disease)    Chronic systolic heart failure ([place])    Hyperlipidemia with target LDL less than 70    Hypertension    Malignant neoplasm of upper-outer quadrant of right female breast ([place])    PVD (peripheral vascular disease) ([place])    Senile osteoporosis    Dyspnea    Protein-calorie malnutrition, severe 11/23/2019   Elevated troponin    Age-related osteoporosis without current pathological fracture 10/29/2019   Atypical ductal hyperplasia of left breast 10/12/2018    Class: Diagnosis of   Long-term use of aspirin therapy 07/20/2017   Mixed hyperlipidemia 05/28/2017   Old MI (myocardial infarction) 05/28/2017   Palpitations 05/28/2017   CHF (congestive heart failure), NYHA class III, acute, systolic ([place]) 35/32/9924   Ischemic dilated cardiomyopathy ([place]) 03/15/2017   Nonrheumatic aortic valve insufficiency 03/15/2017   Non-rheumatic mitral regurgitation 03/15/2017   S/P CABG (coronary artery bypass graft) 03/15/2017   Statin intolerance 03/15/2017   Dysphagia as late effect of cerebrovascular accident (CVA) 04/02/2016   Drug-induced skin rash 26/83/4196   Embolic stroke involving right middle cerebral artery ([place]) 03/25/2016   Atherosclerotic heart disease of native coronary artery without angina pectoris 10/02/2014   Essential (primary) hypertension 10/02/2014   Other specified postprocedural states 10/02/2014   History of mitral valve repair 2008   Hx  of CABG 2008    Current Outpatient Medications on File Prior to Visit  Medication Sig Dispense Refill   aspirin EC 81 MG EC tablet Take 1 tablet (81 mg total) by mouth daily. Swallow whole. 30 tablet 11   clopidogrel (PLAVIX) 75 MG tablet Take 75 mg by mouth daily.     doxycycline (VIBRA-TABS) 100 MG tablet Take 1 tablet (100 mg total) by mouth 2 (two) times daily. 20 tablet 0   fluticasone (FLONASE) 50 MCG/ACT nasal spray Place 2 sprays into both nostrils daily.     furosemide (LASIX) 20 MG tablet Take 1 tablet by mouth once daily 90 tablet 3   levothyroxine (SYNTHROID) 50 MCG tablet Take 50 mcg by mouth daily before breakfast.     MAGNESIUM CITRATE PO Take 400 mg by mouth in the morning, at noon, and at bedtime.     metoprolol tartrate (LOPRESSOR) 25 MG tablet Take 25 mg by mouth 2 (two) times daily. Pt takes a total of 75 mg daily     metoprolol tartrate (LOPRESSOR) 50 MG tablet Take 50 mg by mouth 2 (two) times daily. Pt takes a total of 75 mg daily     nitroGLYCERIN (NITROSTAT) 0.4 MG SL tablet Place 0.4 mg under the tongue every 5 (five) minutes as needed for chest pain.     ondansetron (ZOFRAN ODT) 4 MG disintegrating tablet Take 1 tablet (4 mg total) by mouth every 8 (eight) hours as needed for nausea or vomiting. 20 tablet 0   pantoprazole (PROTONIX) 40 MG tablet Take 40 mg by mouth  daily.     promethazine (PHENERGAN) 12.5 MG tablet Take 12.5 mg by mouth 4 (four) times daily as needed for nausea/vomiting.     Red Yeast Rice 600 MG CAPS Take 600 mg by mouth in the morning and at bedtime.     spironolactone (ALDACTONE) 25 MG tablet Take 0.5 tablets (12.5 mg total) by mouth daily. 30 tablet 0   traMADol (ULTRAM) 50 MG tablet Take 50 mg by mouth every 6 (six) hours as needed for severe pain.     No current facility-administered medications on file prior to visit.    Allergies  Allergen Reactions   Iodinated Diagnostic Agents Rash   Ticagrelor Rash   Statins Other (See Comments)     myalgias     Objective:  General: Alert and oriented x3 in no acute distress  Dermatology:  There is dry scab that measures less than 0.5 cm at the distal tuft of the right hallux.  No erythema, no edema. Preulcerative callus noted to right heel noted signs of infection.  Rosy appearing raised rash noted to the dorsum of the right foot as well as the first webspace on the left and lateral heel likely consistent with dermatitis versus tinea.  Nails x10 are minimally elongated and thickened  Vascular: Dorsalis Pedis and Posterior Tibial pedal pulses nonpalpable  Temperature gradient decreased bilateral.  Purple discoloration to toes.  Neurology: Johney Maine sensation intact via light touch bilateral.  Musculoskeletal: Minimal tenderness with palpation right great toe.  There is significant bunion and hammertoe deformity note.d.  Pes planus deformity noted.   Assessment and Plan: Problem List Items Addressed This Visit   None Visit Diagnoses     Dermatitis    -  Primary   PAD (peripheral artery disease) (HCC)       Pre-ulcerative calluses       Eschar of toe       resolved   Relevant Medications   clotrimazole-betamethasone (LOTRISONE) cream   Terbinafine HCl (LAMISIL AT SPRAY) 1 % SOLN       -Complete examination performed -Re-Discussed treatment options resolved eschar to right great toe and PAD and preulcerative nature of skin changes bilateral likely consistent with dermatitis versus tinea -Using a sterile tissue nipper mechanically debrided dry scab at the right great toe with no underlying opening noted -May discontinue Medihoney to this area continue with a loose white sock and closely monitor right great toe -Prescribed clobetasol cream for patient to use to the dorsum of the right foot and to the left heel at area of tinea/dermatitis -Prescribe Lamisil spray to use in between the toes as directed -Advised patient's daughter to make sure she gets a follow-up with  interventional radiology/vascular for purple discoloration to feet -Return as scheduled or sooner if problems arise.  Landis Martins, DPM

## 2020-12-13 NOTE — Patient Instructions (Signed)
Interventional Radiology/Vascular surgery  [phone]

## 2020-12-19 DIAGNOSIS — Z7902 Long term (current) use of antithrombotics/antiplatelets: Secondary | ICD-10-CM | POA: Diagnosis not present

## 2020-12-19 DIAGNOSIS — I5032 Chronic diastolic (congestive) heart failure: Secondary | ICD-10-CM | POA: Diagnosis not present

## 2020-12-19 DIAGNOSIS — I110 Hypertensive heart disease with heart failure: Secondary | ICD-10-CM | POA: Diagnosis not present

## 2020-12-19 DIAGNOSIS — E785 Hyperlipidemia, unspecified: Secondary | ICD-10-CM | POA: Diagnosis not present

## 2020-12-19 DIAGNOSIS — Z7982 Long term (current) use of aspirin: Secondary | ICD-10-CM | POA: Diagnosis not present

## 2020-12-19 DIAGNOSIS — I2510 Atherosclerotic heart disease of native coronary artery without angina pectoris: Secondary | ICD-10-CM | POA: Diagnosis not present

## 2020-12-19 DIAGNOSIS — C50411 Malignant neoplasm of upper-outer quadrant of right female breast: Secondary | ICD-10-CM | POA: Diagnosis not present

## 2020-12-19 DIAGNOSIS — Z7951 Long term (current) use of inhaled steroids: Secondary | ICD-10-CM | POA: Diagnosis not present

## 2020-12-19 DIAGNOSIS — Z9981 Dependence on supplemental oxygen: Secondary | ICD-10-CM | POA: Diagnosis not present

## 2020-12-19 DIAGNOSIS — J9621 Acute and chronic respiratory failure with hypoxia: Secondary | ICD-10-CM | POA: Diagnosis not present

## 2020-12-19 DIAGNOSIS — I5023 Acute on chronic systolic (congestive) heart failure: Secondary | ICD-10-CM | POA: Diagnosis not present

## 2020-12-19 DIAGNOSIS — L309 Dermatitis, unspecified: Secondary | ICD-10-CM | POA: Diagnosis not present

## 2020-12-19 DIAGNOSIS — I739 Peripheral vascular disease, unspecified: Secondary | ICD-10-CM | POA: Diagnosis not present

## 2020-12-19 DIAGNOSIS — I350 Nonrheumatic aortic (valve) stenosis: Secondary | ICD-10-CM | POA: Diagnosis not present

## 2020-12-19 DIAGNOSIS — Z8673 Personal history of transient ischemic attack (TIA), and cerebral infarction without residual deficits: Secondary | ICD-10-CM | POA: Diagnosis not present

## 2020-12-20 ENCOUNTER — Other Ambulatory Visit: Payer: Self-pay | Admitting: Sports Medicine

## 2020-12-24 DIAGNOSIS — Z8673 Personal history of transient ischemic attack (TIA), and cerebral infarction without residual deficits: Secondary | ICD-10-CM | POA: Diagnosis not present

## 2020-12-24 DIAGNOSIS — Z7902 Long term (current) use of antithrombotics/antiplatelets: Secondary | ICD-10-CM | POA: Diagnosis not present

## 2020-12-24 DIAGNOSIS — Z7982 Long term (current) use of aspirin: Secondary | ICD-10-CM | POA: Diagnosis not present

## 2020-12-24 DIAGNOSIS — I739 Peripheral vascular disease, unspecified: Secondary | ICD-10-CM | POA: Diagnosis not present

## 2020-12-24 DIAGNOSIS — J9621 Acute and chronic respiratory failure with hypoxia: Secondary | ICD-10-CM | POA: Diagnosis not present

## 2020-12-24 DIAGNOSIS — I5023 Acute on chronic systolic (congestive) heart failure: Secondary | ICD-10-CM | POA: Diagnosis not present

## 2020-12-24 DIAGNOSIS — I2510 Atherosclerotic heart disease of native coronary artery without angina pectoris: Secondary | ICD-10-CM | POA: Diagnosis not present

## 2020-12-24 DIAGNOSIS — E785 Hyperlipidemia, unspecified: Secondary | ICD-10-CM | POA: Diagnosis not present

## 2020-12-24 DIAGNOSIS — I110 Hypertensive heart disease with heart failure: Secondary | ICD-10-CM | POA: Diagnosis not present

## 2020-12-24 DIAGNOSIS — I5032 Chronic diastolic (congestive) heart failure: Secondary | ICD-10-CM | POA: Diagnosis not present

## 2020-12-24 DIAGNOSIS — Z7951 Long term (current) use of inhaled steroids: Secondary | ICD-10-CM | POA: Diagnosis not present

## 2020-12-24 DIAGNOSIS — C50411 Malignant neoplasm of upper-outer quadrant of right female breast: Secondary | ICD-10-CM | POA: Diagnosis not present

## 2020-12-24 DIAGNOSIS — L309 Dermatitis, unspecified: Secondary | ICD-10-CM | POA: Diagnosis not present

## 2020-12-24 DIAGNOSIS — Z9981 Dependence on supplemental oxygen: Secondary | ICD-10-CM | POA: Diagnosis not present

## 2020-12-24 DIAGNOSIS — I350 Nonrheumatic aortic (valve) stenosis: Secondary | ICD-10-CM | POA: Diagnosis not present

## 2020-12-31 ENCOUNTER — Ambulatory Visit: Payer: Medicare Other | Admitting: Sports Medicine

## 2020-12-31 DIAGNOSIS — L309 Dermatitis, unspecified: Secondary | ICD-10-CM

## 2020-12-31 DIAGNOSIS — I739 Peripheral vascular disease, unspecified: Secondary | ICD-10-CM | POA: Diagnosis not present

## 2020-12-31 DIAGNOSIS — L84 Corns and callosities: Secondary | ICD-10-CM

## 2020-12-31 DIAGNOSIS — M79674 Pain in right toe(s): Secondary | ICD-10-CM | POA: Diagnosis not present

## 2020-12-31 DIAGNOSIS — S90415A Abrasion, left lesser toe(s), initial encounter: Secondary | ICD-10-CM

## 2020-12-31 DIAGNOSIS — R234 Changes in skin texture: Secondary | ICD-10-CM | POA: Diagnosis not present

## 2020-12-31 MED ORDER — LAMISIL AT SPRAY 1 % EX SOLN: CUTANEOUS | 0 refills | Status: DC

## 2020-12-31 MED ORDER — CLOTRIMAZOLE-BETAMETHASONE 1-0.05 % EX CREA: TOPICAL_CREAM | CUTANEOUS | 0 refills | Status: DC

## 2020-12-31 NOTE — Progress Notes (Signed)
Subjective: Olivia [name] is a 87 y.o. female patient who returns to the office for follow-up wound care. Patient is assisted by son who does not know much about care however the daughter did send a note updating me that the cream is helping but they did not get the Lamisil spray and states that she bumped the left great toe with a walker and has a bruise on 8 and also states that she has been unable to reach the vascular doctor with the previous number that I provided.  Patient Active Problem List   Diagnosis Date Noted   Breast cancer [facility])    CAD (coronary artery disease)    Chronic systolic heart failure ([place])    Hyperlipidemia with target LDL less than 70    Hypertension    Malignant neoplasm of upper-outer quadrant of right female breast ([place])    PVD (peripheral vascular disease) ([place])    Senile osteoporosis    Dyspnea    Protein-calorie malnutrition, severe 11/23/2019   Elevated troponin    Age-related osteoporosis without current pathological fracture 10/29/2019   Atypical ductal hyperplasia of left breast 10/12/2018    Class: Diagnosis of   Long-term use of aspirin therapy 07/20/2017   Mixed hyperlipidemia 05/28/2017   Old MI (myocardial infarction) 05/28/2017   Palpitations 05/28/2017   CHF (congestive heart failure), NYHA class III, acute, systolic ([place]) 63/89/3734   Ischemic dilated cardiomyopathy ([place]) 03/15/2017   Nonrheumatic aortic valve insufficiency 03/15/2017   Non-rheumatic mitral regurgitation 03/15/2017   S/P CABG (coronary artery bypass graft) 03/15/2017   Statin intolerance 03/15/2017   Dysphagia as late effect of cerebrovascular accident (CVA) 04/02/2016   Drug-induced skin rash 28/76/8115   Embolic stroke involving right middle cerebral artery ([place]) 03/25/2016   Atherosclerotic heart disease of native coronary artery without angina pectoris 10/02/2014   Essential (primary) hypertension 10/02/2014   Other specified postprocedural states 10/02/2014    History of mitral valve repair 2008   Hx of CABG 2008    Current Outpatient Medications on File Prior to Visit  Medication Sig Dispense Refill   aspirin EC 81 MG EC tablet Take 1 tablet (81 mg total) by mouth daily. Swallow whole. 30 tablet 11   clopidogrel (PLAVIX) 75 MG tablet Take 75 mg by mouth daily.     doxycycline (VIBRA-TABS) 100 MG tablet Take 1 tablet by mouth twice daily 20 tablet 0   fluticasone (FLONASE) 50 MCG/ACT nasal spray Place 2 sprays into both nostrils daily.     furosemide (LASIX) 20 MG tablet Take 1 tablet by mouth once daily 90 tablet 3   levothyroxine (SYNTHROID) 50 MCG tablet Take 50 mcg by mouth daily before breakfast.     MAGNESIUM CITRATE PO Take 400 mg by mouth in the morning, at noon, and at bedtime.     metoprolol tartrate (LOPRESSOR) 25 MG tablet Take 25 mg by mouth 2 (two) times daily. Pt takes a total of 75 mg daily     metoprolol tartrate (LOPRESSOR) 50 MG tablet Take 50 mg by mouth 2 (two) times daily. Pt takes a total of 75 mg daily     nitroGLYCERIN (NITROSTAT) 0.4 MG SL tablet Place 0.4 mg under the tongue every 5 (five) minutes as needed for chest pain.     ondansetron (ZOFRAN ODT) 4 MG disintegrating tablet Take 1 tablet (4 mg total) by mouth every 8 (eight) hours as needed for nausea or vomiting. 20 tablet 0   pantoprazole (PROTONIX) 40 MG tablet Take 40  mg by mouth daily.     promethazine (PHENERGAN) 12.5 MG tablet Take 12.5 mg by mouth 4 (four) times daily as needed for nausea/vomiting.     Red Yeast Rice 600 MG CAPS Take 600 mg by mouth in the morning and at bedtime.     spironolactone (ALDACTONE) 25 MG tablet Take 0.5 tablets (12.5 mg total) by mouth daily. 30 tablet 0   traMADol (ULTRAM) 50 MG tablet Take 50 mg by mouth every 6 (six) hours as needed for severe pain.     No current facility-administered medications on file prior to visit.    Allergies  Allergen Reactions   Iodinated Diagnostic Agents Rash   Ticagrelor Rash   Statins Other  (See Comments)    myalgias     Objective:  General: Alert and oriented x3 in no acute distress  Dermatology:  There is dry scab that measures less than 0.5 cm at the distal tuft of the right hallux remains unchanged and stable.  No erythema, no edema. Preulcerative callus noted to right heel noted signs of infection.  Rosy appearing raised rash noted to the dorsum of the right foot as well as the first webspace on the left and lateral heel likely consistent with dermatitis versus tinea with now a small partial abrasion noted at the left first webspace.  Nails x10 are minimally elongated and thickened  Vascular: Dorsalis Pedis and Posterior Tibial pedal pulses nonpalpable  Temperature gradient decreased bilateral.  Purple discoloration to toes.  Neurology: Johney Maine sensation intact via light touch bilateral.  Musculoskeletal: No reproducible tenderness to palpation bilateral.  There is significant bunion and hammertoe deformity noted bilateral.  Pes planus deformity noted.   Assessment and Plan: Problem List Items Addressed This Visit   None Visit Diagnoses     Dermatitis    -  Primary   PAD (peripheral artery disease) (HCC)       Pre-ulcerative calluses       Eschar of toe       Relevant Medications   clotrimazole-betamethasone (LOTRISONE) cream   Terbinafine HCl (LAMISIL AT SPRAY) 1 % SOLN   Toe pain, right       Abrasion of second toe of left foot, initial encounter           -Complete examination performed -Re-Discussed treatment options resolved eschar to right great toe and PAD and preulcerative nature of skin changes bilateral likely consistent with dermatitis versus tinea and abrasion at left first webspace -Using a sterile tissue nipper mechanically debrided nails and using a chisel blade debrided keratosis at right plantar heel without incident -Refilled clobetasol cream for patient to use to the dorsum of the right foot and to the left heel at area of  tinea/dermatitis -Refilled Lamisil spray to use in between the toes as directed -Advised patient to use cotton spacing during the day when in shoes to prevent rubbing of toes -Advised patient's daughter (via hand written note) to make sure she gets a follow-up with interventional radiology/vascular for purple discoloration to feet; advised her to call [facility] to get the phone number for interventional radiology/vascular -Return as scheduled or sooner if problems arise.  Landis Martins, DPM

## 2021-01-01 DIAGNOSIS — I2510 Atherosclerotic heart disease of native coronary artery without angina pectoris: Secondary | ICD-10-CM | POA: Diagnosis not present

## 2021-01-01 DIAGNOSIS — Z7902 Long term (current) use of antithrombotics/antiplatelets: Secondary | ICD-10-CM | POA: Diagnosis not present

## 2021-01-01 DIAGNOSIS — C50411 Malignant neoplasm of upper-outer quadrant of right female breast: Secondary | ICD-10-CM | POA: Diagnosis not present

## 2021-01-01 DIAGNOSIS — I350 Nonrheumatic aortic (valve) stenosis: Secondary | ICD-10-CM | POA: Diagnosis not present

## 2021-01-01 DIAGNOSIS — I5032 Chronic diastolic (congestive) heart failure: Secondary | ICD-10-CM | POA: Diagnosis not present

## 2021-01-01 DIAGNOSIS — Z9981 Dependence on supplemental oxygen: Secondary | ICD-10-CM | POA: Diagnosis not present

## 2021-01-01 DIAGNOSIS — I5023 Acute on chronic systolic (congestive) heart failure: Secondary | ICD-10-CM | POA: Diagnosis not present

## 2021-01-01 DIAGNOSIS — Z7951 Long term (current) use of inhaled steroids: Secondary | ICD-10-CM | POA: Diagnosis not present

## 2021-01-01 DIAGNOSIS — L309 Dermatitis, unspecified: Secondary | ICD-10-CM | POA: Diagnosis not present

## 2021-01-01 DIAGNOSIS — Z8673 Personal history of transient ischemic attack (TIA), and cerebral infarction without residual deficits: Secondary | ICD-10-CM | POA: Diagnosis not present

## 2021-01-01 DIAGNOSIS — Z7982 Long term (current) use of aspirin: Secondary | ICD-10-CM | POA: Diagnosis not present

## 2021-01-01 DIAGNOSIS — J9621 Acute and chronic respiratory failure with hypoxia: Secondary | ICD-10-CM | POA: Diagnosis not present

## 2021-01-01 DIAGNOSIS — I110 Hypertensive heart disease with heart failure: Secondary | ICD-10-CM | POA: Diagnosis not present

## 2021-01-01 DIAGNOSIS — E785 Hyperlipidemia, unspecified: Secondary | ICD-10-CM | POA: Diagnosis not present

## 2021-01-01 DIAGNOSIS — I739 Peripheral vascular disease, unspecified: Secondary | ICD-10-CM | POA: Diagnosis not present

## 2021-01-06 DIAGNOSIS — I509 Heart failure, unspecified: Secondary | ICD-10-CM | POA: Diagnosis not present

## 2021-01-07 DIAGNOSIS — I110 Hypertensive heart disease with heart failure: Secondary | ICD-10-CM | POA: Diagnosis not present

## 2021-01-07 DIAGNOSIS — I350 Nonrheumatic aortic (valve) stenosis: Secondary | ICD-10-CM | POA: Diagnosis not present

## 2021-01-07 DIAGNOSIS — Z7982 Long term (current) use of aspirin: Secondary | ICD-10-CM | POA: Diagnosis not present

## 2021-01-07 DIAGNOSIS — Z7951 Long term (current) use of inhaled steroids: Secondary | ICD-10-CM | POA: Diagnosis not present

## 2021-01-07 DIAGNOSIS — L309 Dermatitis, unspecified: Secondary | ICD-10-CM | POA: Diagnosis not present

## 2021-01-07 DIAGNOSIS — C50411 Malignant neoplasm of upper-outer quadrant of right female breast: Secondary | ICD-10-CM | POA: Diagnosis not present

## 2021-01-07 DIAGNOSIS — E785 Hyperlipidemia, unspecified: Secondary | ICD-10-CM | POA: Diagnosis not present

## 2021-01-07 DIAGNOSIS — J9621 Acute and chronic respiratory failure with hypoxia: Secondary | ICD-10-CM | POA: Diagnosis not present

## 2021-01-07 DIAGNOSIS — Z9981 Dependence on supplemental oxygen: Secondary | ICD-10-CM | POA: Diagnosis not present

## 2021-01-07 DIAGNOSIS — I5032 Chronic diastolic (congestive) heart failure: Secondary | ICD-10-CM | POA: Diagnosis not present

## 2021-01-07 DIAGNOSIS — Z8673 Personal history of transient ischemic attack (TIA), and cerebral infarction without residual deficits: Secondary | ICD-10-CM | POA: Diagnosis not present

## 2021-01-07 DIAGNOSIS — I2510 Atherosclerotic heart disease of native coronary artery without angina pectoris: Secondary | ICD-10-CM | POA: Diagnosis not present

## 2021-01-07 DIAGNOSIS — I739 Peripheral vascular disease, unspecified: Secondary | ICD-10-CM | POA: Diagnosis not present

## 2021-01-07 DIAGNOSIS — I5023 Acute on chronic systolic (congestive) heart failure: Secondary | ICD-10-CM | POA: Diagnosis not present

## 2021-01-07 DIAGNOSIS — Z7902 Long term (current) use of antithrombotics/antiplatelets: Secondary | ICD-10-CM | POA: Diagnosis not present

## 2021-01-10 ENCOUNTER — Other Ambulatory Visit: Payer: Self-pay | Admitting: Sports Medicine

## 2021-01-10 MED ORDER — CLINDAMYCIN HCL 300 MG PO CAPS: 300.0000 mg | ORAL_CAPSULE | Freq: Three times a day (TID) | ORAL | 0 refills | Status: DC

## 2021-01-10 MED ORDER — CIPROFLOXACIN HCL 500 MG PO TABS: 500.0000 mg | ORAL_TABLET | Freq: Two times a day (BID) | ORAL | 0 refills | Status: AC

## 2021-01-10 NOTE — Progress Notes (Signed)
Sent antibiotic.

## 2021-01-14 ENCOUNTER — Telehealth: Payer: Self-pay | Admitting: *Deleted

## 2021-01-14 NOTE — Telephone Encounter (Signed)
-----   Message from Landis Martins, [place] sent at 01/10/2021  5:30 PM EST ----- I have sent antibiotic to her pharmacy ----- Message ----- From: Viviana Simpler, [employer] Sent: 01/10/2021  11:31 AM EST To: Landis Martins, DPM  Daughter [name]) called and stated that the patient has a rash and was to come in for an appointment in early January to have it checked and called and got an appointment for 01-22-2021 and there is some swelling and was on an antibiotic that Dr Cannon Kettle put her on last time and seemed to help it and it was cipro and clindamycin. [name] [phone] [name])

## 2021-01-14 NOTE — Telephone Encounter (Signed)
Called and spoke with the Daughter [name]) today and wanted to make sure the patient received the antibiotic and the daughter stated that she did and it was helping and I stated to call the office if any concerns or questions. Olivia [name]

## 2021-01-15 DIAGNOSIS — M8589 Other specified disorders of bone density and structure, multiple sites: Secondary | ICD-10-CM | POA: Diagnosis not present

## 2021-01-15 DIAGNOSIS — M810 Age-related osteoporosis without current pathological fracture: Secondary | ICD-10-CM | POA: Diagnosis not present

## 2021-01-15 DIAGNOSIS — Z780 Asymptomatic menopausal state: Secondary | ICD-10-CM | POA: Diagnosis not present

## 2021-01-17 DIAGNOSIS — L309 Dermatitis, unspecified: Secondary | ICD-10-CM | POA: Diagnosis not present

## 2021-01-17 DIAGNOSIS — I350 Nonrheumatic aortic (valve) stenosis: Secondary | ICD-10-CM | POA: Diagnosis not present

## 2021-01-17 DIAGNOSIS — Z8673 Personal history of transient ischemic attack (TIA), and cerebral infarction without residual deficits: Secondary | ICD-10-CM | POA: Diagnosis not present

## 2021-01-17 DIAGNOSIS — E785 Hyperlipidemia, unspecified: Secondary | ICD-10-CM | POA: Diagnosis not present

## 2021-01-17 DIAGNOSIS — C50411 Malignant neoplasm of upper-outer quadrant of right female breast: Secondary | ICD-10-CM | POA: Diagnosis not present

## 2021-01-17 DIAGNOSIS — I739 Peripheral vascular disease, unspecified: Secondary | ICD-10-CM | POA: Diagnosis not present

## 2021-01-17 DIAGNOSIS — I5023 Acute on chronic systolic (congestive) heart failure: Secondary | ICD-10-CM | POA: Diagnosis not present

## 2021-01-17 DIAGNOSIS — Z7951 Long term (current) use of inhaled steroids: Secondary | ICD-10-CM | POA: Diagnosis not present

## 2021-01-17 DIAGNOSIS — Z9981 Dependence on supplemental oxygen: Secondary | ICD-10-CM | POA: Diagnosis not present

## 2021-01-17 DIAGNOSIS — Z7902 Long term (current) use of antithrombotics/antiplatelets: Secondary | ICD-10-CM | POA: Diagnosis not present

## 2021-01-17 DIAGNOSIS — I2510 Atherosclerotic heart disease of native coronary artery without angina pectoris: Secondary | ICD-10-CM | POA: Diagnosis not present

## 2021-01-17 DIAGNOSIS — J9621 Acute and chronic respiratory failure with hypoxia: Secondary | ICD-10-CM | POA: Diagnosis not present

## 2021-01-17 DIAGNOSIS — I110 Hypertensive heart disease with heart failure: Secondary | ICD-10-CM | POA: Diagnosis not present

## 2021-01-17 DIAGNOSIS — I5032 Chronic diastolic (congestive) heart failure: Secondary | ICD-10-CM | POA: Diagnosis not present

## 2021-01-17 DIAGNOSIS — Z7982 Long term (current) use of aspirin: Secondary | ICD-10-CM | POA: Diagnosis not present

## 2021-01-18 ENCOUNTER — Other Ambulatory Visit: Payer: Self-pay | Admitting: Sports Medicine

## 2021-01-19 DIAGNOSIS — J069 Acute upper respiratory infection, unspecified: Secondary | ICD-10-CM | POA: Diagnosis not present

## 2021-01-19 DIAGNOSIS — U071 COVID-19: Secondary | ICD-10-CM | POA: Diagnosis not present

## 2021-01-19 DIAGNOSIS — Z20822 Contact with and (suspected) exposure to covid-19: Secondary | ICD-10-CM | POA: Diagnosis not present

## 2021-01-19 NOTE — Telephone Encounter (Signed)
Please advise 

## 2021-01-22 ENCOUNTER — Ambulatory Visit: Payer: Medicare Other | Admitting: Sports Medicine

## 2021-01-29 ENCOUNTER — Encounter: Payer: Self-pay | Admitting: Oncology

## 2021-02-04 DIAGNOSIS — I10 Essential (primary) hypertension: Secondary | ICD-10-CM | POA: Diagnosis not present

## 2021-02-04 DIAGNOSIS — I351 Nonrheumatic aortic (valve) insufficiency: Secondary | ICD-10-CM | POA: Diagnosis not present

## 2021-02-04 DIAGNOSIS — I2510 Atherosclerotic heart disease of native coronary artery without angina pectoris: Secondary | ICD-10-CM | POA: Diagnosis not present

## 2021-02-04 DIAGNOSIS — Z951 Presence of aortocoronary bypass graft: Secondary | ICD-10-CM | POA: Diagnosis not present

## 2021-02-04 DIAGNOSIS — Z9889 Other specified postprocedural states: Secondary | ICD-10-CM | POA: Diagnosis not present

## 2021-02-04 DIAGNOSIS — I255 Ischemic cardiomyopathy: Secondary | ICD-10-CM | POA: Diagnosis not present

## 2021-02-04 DIAGNOSIS — E782 Mixed hyperlipidemia: Secondary | ICD-10-CM | POA: Diagnosis not present

## 2021-02-04 DIAGNOSIS — Z7982 Long term (current) use of aspirin: Secondary | ICD-10-CM | POA: Diagnosis not present

## 2021-02-04 DIAGNOSIS — Z789 Other specified health status: Secondary | ICD-10-CM | POA: Diagnosis not present

## 2021-02-05 ENCOUNTER — Ambulatory Visit: Payer: Medicare Other | Admitting: Sports Medicine

## 2021-02-06 ENCOUNTER — Encounter: Payer: Self-pay | Admitting: Sports Medicine

## 2021-02-06 ENCOUNTER — Other Ambulatory Visit: Payer: Self-pay | Admitting: Sports Medicine

## 2021-02-06 DIAGNOSIS — I509 Heart failure, unspecified: Secondary | ICD-10-CM | POA: Diagnosis not present

## 2021-02-06 NOTE — Telephone Encounter (Signed)
Please advise 

## 2021-02-06 NOTE — Progress Notes (Signed)
PA for [place] antibiotic completed Awaiting coverage determination from insurance

## 2021-02-10 DIAGNOSIS — Z955 Presence of coronary angioplasty implant and graft: Secondary | ICD-10-CM | POA: Diagnosis not present

## 2021-02-10 DIAGNOSIS — D62 Acute posthemorrhagic anemia: Secondary | ICD-10-CM | POA: Diagnosis not present

## 2021-02-10 DIAGNOSIS — I739 Peripheral vascular disease, unspecified: Secondary | ICD-10-CM | POA: Diagnosis not present

## 2021-02-10 DIAGNOSIS — I2510 Atherosclerotic heart disease of native coronary artery without angina pectoris: Secondary | ICD-10-CM | POA: Diagnosis not present

## 2021-02-10 DIAGNOSIS — E785 Hyperlipidemia, unspecified: Secondary | ICD-10-CM | POA: Diagnosis not present

## 2021-02-10 DIAGNOSIS — E039 Hypothyroidism, unspecified: Secondary | ICD-10-CM | POA: Diagnosis not present

## 2021-02-10 DIAGNOSIS — I480 Paroxysmal atrial fibrillation: Secondary | ICD-10-CM | POA: Diagnosis not present

## 2021-02-10 DIAGNOSIS — I5022 Chronic systolic (congestive) heart failure: Secondary | ICD-10-CM | POA: Diagnosis not present

## 2021-02-10 DIAGNOSIS — I63411 Cerebral infarction due to embolism of right middle cerebral artery: Secondary | ICD-10-CM | POA: Diagnosis not present

## 2021-02-11 NOTE — Progress Notes (Signed)
Olivia [name]  [address] [place],  [place]  [address] [phone]  Clinic Day:  02/14/2021  Referring physician: Nicoletta Dress, MD  This document serves as a record of services personally performed by Hosie Poisson, MD. It was created on their behalf by [facility] E, a trained medical scribe. The creation of this record is based on the scribe's personal observations and the provider's statements to them.  CHIEF COMPLAINT:  CC:  Stage II B hormone receptor positive breast cancer and age-related osteoporosis  Current Treatment:    Prolia every 6 months   HISTORY OF PRESENT ILLNESS:  Olivia [name] is a 88 y.o. 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  She was scheduled for a right partial mastectomy in February 2018, but had an acute myocardial infarction and stroke.  She required placement of 2 coronary artery stents.  The surgery was delayed until August.  Final pathology revealed multifocal invasive ductal carcinoma with a 2.4 cm lesion and a 1.2 cm lesion, both grade 2 with clear margins, as well as intermediate grade ductal carcinoma in situ.  One of 2 nodes was positive with extracapsular extension and lymphovascular invasion.  Estrogen and progesterone receptors were positive and her 2 Neu was negative.  Ki 67 was 10%.  Due to her other medical comorbidities, we did not feel she was a candidate for aggressive chemotherapy.  She was placed on anastrozole 1 mg daily in September 2018.  Bone density scan in September 2018 revealed osteoporosis with a statistically significant decrease in the bone mineral density compared to the prior study of 2005. Her T-score of the right femur was -3.6, which was 6.8% worse, with a T-score of -3.2 of the right forearm.  She was already taking calcium 600 mg twice daily.  Due to the risk of worsening osteopenia with anastrozole, she was also placed  on alendronate 70 mg weekly.  She went to the hospital in early 2020 twice for bleeding ulcers. She had four blood transfusions.  She was due for mammography in May, but this was delayed due to COVID-19.  Bilateral diagnostic mammogram in August of 2020 revealed a 3 cm area of pleomorphic calcifications in the left breast.  There was no evidence of malignancy in the right breast.  Biopsy of the left breast lesion revealed focal atypical ductal hyperplasia with calcifications, with no in situ or invasive malignancy identified.  She underwent left lumpectomy in September.  Final pathology revealed atypical ductal hyperplasia and florid usual ductal hyperplasia with calcifications.  No in situ or invasive malignancy was identified.  Margins were clear.  Bone density from September 2020 revealed worsening osteoporosis with a T-score of -3.6 of the left femur, previously -3.4.  The right forearm measured -4.3, previously -3.2.  We therefore stopped the anastrozole after 2 years and switched to raloxifene for chemoprevention, which can also also treat her osteoporosis.  She was  placed on Prolia injections every 6 months for osteoporosis.  Due to severe arthralgias affecting her quality of life, we discontinued raloxifene in March 2021. She presented to the emergency department in mid September, and then again on October 2nd due to acute [name] injury.  CT urogram from October revealed a 7 mm obstructing proximal right ureteral calculus at the L3-4 level, with  moderate right hydronephrosis, and a 16 mm calculus in the left renal pelvis. There were additional nonobstructing bilateral renal calculi,  and a 17 mm hyperdense lesion in the anterior right upper [name], possibly reflecting a hemorrhagic cyst.  She saw Dr. Nila Nephew he discontinued calcium and vitamin D.  She was scheduled for lithotripsy.    Annual bilateral mammogram and ultrasound from August 2022 revealed an interval 1.7 x 1.5 x 0.4 cm irregular mass with  pleomorphic calcifications in the skin and underlying breast in the 10 o'clock position of the right breast.  No evidence of malignancy elsewhere in either breast.  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  Tumor measures 1.5 cm and is invasive within dermis and subcutaneous tissue.  Skin ulceration, lymphovascular invasion and perineural invasion identified.  Margins free of neoplasm.  This stages her as a stage IA (T1c N0 M0).  HER2 was equivocal 2+, but negative by FISH.  Estrogen receptor was positive at 100% and progesterone receptor was positive at 5%.  Ki67 was 15%.   INTERVAL HISTORY:  Olivia [name] is here for routine follow up and states that she has been well. She did contract COVID before Christmas.  Bone density from December 2022 revealed osteoporosis with a T-score of -3.5 of the right femur. Dual femur total mean measures -3.4, and left forearm measures -2.8. No significant interval change. Her  appetite is good, and she has gained 1 and 1/2 pounds since her last visit.  She denies fever, chills or other signs of infection.  She denies nausea, vomiting, bowel issues, or abdominal pain.  She denies sore throat, cough, dyspnea, or chest pain.  REVIEW OF SYSTEMS:  Review of Systems  Constitutional: Negative.  Negative for appetite change, chills, fatigue, fever and unexpected weight change.  HENT:  Negative.    Eyes: Negative.   Respiratory: Negative.  Negative for chest tightness, cough, hemoptysis, shortness of breath and wheezing.   Cardiovascular: Negative.  Negative for chest pain, leg swelling and palpitations.  Gastrointestinal: Negative.  Negative for abdominal distention, abdominal pain, blood in stool, constipation, diarrhea, nausea and vomiting.  Endocrine: Negative.   Genitourinary: Negative.  Negative for difficulty urinating, dysuria, frequency and  hematuria.   Musculoskeletal: Negative.  Negative for arthralgias, back pain, flank pain, gait problem and myalgias.  Skin: Negative.   Neurological: Negative.  Negative for dizziness, extremity weakness, gait problem, headaches, light-headedness, numbness, seizures and speech difficulty.  Hematological: Negative.   Psychiatric/Behavioral: Negative.  Negative for depression and sleep disturbance. The patient is not nervous/anxious.     VITALS:  Blood pressure (!) 152/65, pulse 67, temperature (!) 97.5 F (36.4 C), temperature source Oral, resp. rate 18, height 5' 4" (1.626 m), weight 126 lb 6.4 oz (57.3 kg), SpO2 96 %.  Wt Readings from Last 3 Encounters:  02/14/21 126 lb 6.4 oz (57.3 kg)  11/19/20 124 lb 0.6 oz (56.3 kg)  11/15/20 124 lb 9.6 oz (56.5 kg)    Body mass index is 21.7 kg/m.  Performance status (ECOG): 0 - Asymptomatic  PHYSICAL EXAM:  Physical Exam Constitutional:      General: She is not in acute distress.    Appearance: Normal appearance. She is normal weight.  HENT:     Head: Normocephalic and atraumatic.  Eyes:     General: No scleral icterus.    Extraocular Movements: Extraocular movements intact.     Conjunctiva/sclera: Conjunctivae normal.     Pupils: Pupils are equal, round, and reactive to light.  Cardiovascular:  Rate and Rhythm: Normal rate and regular rhythm.     Pulses: Normal pulses.     Heart sounds: Murmur heard.  Systolic murmur is present with a grade of 1/6.    No friction rub. No gallop.  Pulmonary:     Effort: Pulmonary effort is normal. No respiratory distress.     Breath sounds: Normal breath sounds.  Chest:     Comments: Scar tissue in the lateral aspect of the right breast. Fibrocystic disease throughout bilateral breasts, but worse on the right. She still has a persistent tiny nodule just lateral to the left nipple. Abdominal:     General: Bowel sounds are normal. There is no distension.     Palpations: Abdomen is soft. There is  no hepatomegaly, splenomegaly or mass.     Tenderness: There is no abdominal tenderness.  Musculoskeletal:        General: Normal range of motion.     Cervical back: Normal range of motion and neck supple.     Right lower leg: No edema.     Left lower leg: No edema.  Lymphadenopathy:     Cervical: No cervical adenopathy.  Skin:    General: Skin is warm and dry.  Neurological:     General: No focal deficit present.     Mental Status: She is alert and oriented to person, place, and time. Mental status is at baseline.  Psychiatric:        Mood and Affect: Mood normal.        Behavior: Behavior normal.        Thought Content: Thought content normal.        Judgment: Judgment normal.   LABS:   CBC Latest Ref Rng & Units 11/15/2020 10/02/2020 05/17/2020  WBC - 7.9 8.5 7.8  Hemoglobin 12.0 - 16.0 12.9 13.3 11.5(A)  Hematocrit 36 - 46 39 41 36  Platelets 150 - 399 274 320 272   CMP Latest Ref Rng & Units 11/15/2020 10/25/2020 10/02/2020  Glucose 65 - 99 mg/dL - 89 -  BUN 4 - 21 29(A) 36(H) 39(A)  Creatinine 0.5 - 1.1 1.5(A) 1.29(H) 1.3(A)  Sodium 137 - 147 141 139 139  Potassium 3.4 - 5.3 3.8 4.6 4.1  Chloride 99 - 108 102 99 99  CO2 13 - 22 31(A) 27 29(A)  Calcium 8.7 - 10.7 9.3 10.4(H) 10.2  Total Protein 6.5 - 8.1 g/dL - - -  Total Bilirubin 0.3 - 1.2 mg/dL - - -  Alkaline Phos 25 - 125 59 - 49  AST 13 - 35 32 - 37(A)  ALT 7 - 35 14 - 14    STUDIES:  No results found.    EXAM: 01/15/2021 DUAL X-RAY ABSORPTIOMETRY (DXA) FOR BONE MINERAL DENSITY   TECHNIQUE:  Bone mineral density measurements are performed of the spine, hip,  and forearm, as appropriate, per International Society of Clinical  Densitometry recommendations. The pertinent regions of interest are  reported below. Non-contributory values are not reported. Images are  obtained for bone mineral density measurement and are not obtained  for diagnostic purposes.   FINDINGS:  LEFT FOREARM 1/3  Bone Mineral  Density (BMD): 0.627 g/cm2  Young Adult T-Score: -2.8  Z-Score: 0.7   TOTAL FEMUR RIGHT  Bone Mineral Density (BMD): 0.566 g/cm2  Young Adult T-Score: -3.5  Z-Score: -1.0   DUAL FEMUR TOTAL MEAN  Bone Mineral Density (BMD): 0.585 g/cm2  Young Adult T-Score: -3.4  Z-Score: -0.8  Unit: [facility]  Lunar iDXA DXA System (analysis version 13.60)  Scan quality: The scan quality is good. Exclusions: None  ASSESSMENT: Patient's diagnostic category is OSTEOPOROSIS by WHO  Criteria.  FRACTURE RISK: INCREASED  FRAX: Patient does not meet criteria for FRAX assessment  COMPARISON: 10/26/2018  No significant interval change in BMD of the hips or LEFT forearm  since prior exam.   HISTORY:   Allergies:  Allergies  Allergen Reactions   Iodinated Contrast Media Rash   Ticagrelor Rash   Statins Other (See Comments)    myalgias  Other reaction(s): Other (see comments) myalgias myalgias    Current Medications: Current Outpatient Medications  Medication Sig Dispense Refill   aspirin EC 81 MG EC tablet Take 1 tablet (81 mg total) by mouth daily. Swallow whole. 30 tablet 11   clindamycin (CLEOCIN) 300 MG capsule Take 1 capsule (300 mg total) by mouth 3 (three) times daily. 30 capsule 0   clopidogrel (PLAVIX) 75 MG tablet Take 75 mg by mouth daily.     clotrimazole-betamethasone (LOTRISONE) cream APPLY TO RASH ON FEET TWICE DAILY 30 g 0   doxycycline (VIBRA-TABS) 100 MG tablet Take 1 tablet by mouth twice daily 20 tablet 0   fluticasone (FLONASE) 50 MCG/ACT nasal spray Place 2 sprays into both nostrils daily.     furosemide (LASIX) 20 MG tablet Take 1 tablet by mouth once daily 90 tablet 3   levothyroxine (SYNTHROID) 50 MCG tablet Take 50 mcg by mouth daily before breakfast.     MAGNESIUM CITRATE PO Take 400 mg by mouth in the morning, at noon, and at bedtime.     metoprolol tartrate (LOPRESSOR) 25 MG tablet Take 25 mg by mouth 2 (two) times daily. Pt takes a total of 75 mg daily      metoprolol tartrate (LOPRESSOR) 50 MG tablet Take 50 mg by mouth 2 (two) times daily. Pt takes a total of 75 mg daily     nitroGLYCERIN (NITROSTAT) 0.4 MG SL tablet Place 0.4 mg under the tongue every 5 (five) minutes as needed for chest pain.     ondansetron (ZOFRAN ODT) 4 MG disintegrating tablet Take 1 tablet (4 mg total) by mouth every 8 (eight) hours as needed for nausea or vomiting. 20 tablet 0   pantoprazole (PROTONIX) 40 MG tablet Take 40 mg by mouth daily.     promethazine (PHENERGAN) 12.5 MG tablet Take 12.5 mg by mouth 4 (four) times daily as needed for nausea/vomiting.     Red Yeast Rice 600 MG CAPS Take 600 mg by mouth in the morning and at bedtime.     spironolactone (ALDACTONE) 25 MG tablet Take 0.5 tablets (12.5 mg total) by mouth daily. 30 tablet 0   Terbinafine HCl (LAMISIL AT SPRAY) 1 % SOLN Spray in between toes once daily at bedtime 125 mL 0   traMADol (ULTRAM) 50 MG tablet Take 50 mg by mouth every 6 (six) hours as needed for severe pain.     No current facility-administered medications for this visit.     ASSESSMENT & PLAN:   Assessment:  1. Stage IIB hormone receptor positive right breast cancer, January 2018 treated with lumpectomy and adjuvant hormonal therapy for about 3 years.    2. Atypical ductal hyperplasia of the left breast, August 2020, status post lumpectomy.  3.  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  She underwent excisional biopsy with Dr. Lilia Pro.  HER2 was equivocal  2+, but negative by FISH.  Estrogen receptor was positive at 100% and progesterone receptor was positive at 5%.  Ki67 was 15%.  We have had several discussions regarding risks and benefits of hormonal therapy for this frail elderly woman with severe osteoporosis. We will not pursue adjuvant hormonal therapy. The margins were clear, but she is at high risk for recurrence due to the perineural and lymphovascular invasion.    3. Osteoporosis.  She is now on Prolia every 6 months with her last injection on October 18th. Calcium and vitamin D have been discontinued due to significant renal lithiasis.  Bone density from December 2022 remains stable.  4. Chronic [name] disease.    5. Bilateral renal lithiasis, she has not currently having symptoms.  6. COVID infection, December 2022. She has recovered very well. She has received all of her COVID vaccinations and boosters.  Plan:   Since this is a stage IA, we will continue with observation only in view of her age and co-morbidities. We will continue to treat her osteoporosis. We will plan to see her back in 3 months with CBC and CMP prior to her next Prolia.  The patient and her daughter understand the plans discussed today and are in agreement with them.  She knows to contact our office if she develops concerns prior to her next appointment.   I, Rita Ohara, am acting as scribe for Derwood Kaplan, MD  I have reviewed this report as typed by the medical scribe, and it is complete and accurate.

## 2021-02-14 ENCOUNTER — Encounter: Payer: Self-pay | Admitting: Oncology

## 2021-02-14 ENCOUNTER — Telehealth: Payer: Self-pay | Admitting: Oncology

## 2021-02-14 ENCOUNTER — Other Ambulatory Visit: Payer: Self-pay

## 2021-02-14 ENCOUNTER — Inpatient Hospital Stay: Payer: Medicare Other | Attending: Oncology | Admitting: Oncology

## 2021-02-14 ENCOUNTER — Other Ambulatory Visit: Payer: Self-pay | Admitting: Oncology

## 2021-02-14 VITALS — BP 152/65 | HR 67 | Temp 97.5°F | Resp 18 | Ht 64.0 in | Wt 126.4 lb

## 2021-02-14 DIAGNOSIS — M810 Age-related osteoporosis without current pathological fracture: Secondary | ICD-10-CM

## 2021-02-14 DIAGNOSIS — Z170 Estrogen receptor positive status [ER+]: Secondary | ICD-10-CM | POA: Diagnosis not present

## 2021-02-14 DIAGNOSIS — C50411 Malignant neoplasm of upper-outer quadrant of right female breast: Secondary | ICD-10-CM | POA: Diagnosis not present

## 2021-02-14 NOTE — Telephone Encounter (Signed)
Appt's scheduled per 02/14/21. Pt given appt calendar.

## 2021-02-19 ENCOUNTER — Encounter: Payer: Self-pay | Admitting: Oncology

## 2021-02-20 DIAGNOSIS — I361 Nonrheumatic tricuspid (valve) insufficiency: Secondary | ICD-10-CM | POA: Diagnosis not present

## 2021-02-21 DIAGNOSIS — I739 Peripheral vascular disease, unspecified: Secondary | ICD-10-CM | POA: Diagnosis not present

## 2021-03-04 ENCOUNTER — Ambulatory Visit: Payer: Medicare Other | Admitting: Sports Medicine

## 2021-03-04 ENCOUNTER — Encounter: Payer: Self-pay | Admitting: Sports Medicine

## 2021-03-04 DIAGNOSIS — M79671 Pain in right foot: Secondary | ICD-10-CM

## 2021-03-04 DIAGNOSIS — L84 Corns and callosities: Secondary | ICD-10-CM

## 2021-03-04 DIAGNOSIS — S90415D Abrasion, left lesser toe(s), subsequent encounter: Secondary | ICD-10-CM | POA: Diagnosis not present

## 2021-03-04 DIAGNOSIS — M79672 Pain in left foot: Secondary | ICD-10-CM | POA: Diagnosis not present

## 2021-03-04 DIAGNOSIS — I739 Peripheral vascular disease, unspecified: Secondary | ICD-10-CM

## 2021-03-04 DIAGNOSIS — L309 Dermatitis, unspecified: Secondary | ICD-10-CM | POA: Diagnosis not present

## 2021-03-04 DIAGNOSIS — L97511 Non-pressure chronic ulcer of other part of right foot limited to breakdown of skin: Secondary | ICD-10-CM | POA: Diagnosis not present

## 2021-03-05 NOTE — Progress Notes (Signed)
Subjective: Olivia [name] is a 88 y.o. female patient who returns to the office for follow-up wound care and nail trim. Daughter has sent note and reports that feet are looking better. Patient is assisted by son in law this visit.  Patient Active Problem List   Diagnosis Date Noted   Breast cancer [facility])    CAD (coronary artery disease)    Chronic systolic heart failure ([place])    Hyperlipidemia with target LDL less than 70    Hypertension    Malignant neoplasm of upper-outer quadrant of right female breast ([place])    PVD (peripheral vascular disease) ([place])    Senile osteoporosis    Dyspnea    Protein-calorie malnutrition, severe 11/23/2019   Elevated troponin    Age-related osteoporosis without current pathological fracture 10/29/2019   Atypical ductal hyperplasia of left breast 10/12/2018    Class: Diagnosis of   Long-term use of aspirin therapy 07/20/2017   Mixed hyperlipidemia 05/28/2017   Old MI (myocardial infarction) 05/28/2017   Palpitations 05/28/2017   CHF (congestive heart failure), NYHA class III, acute, systolic ([employer]) 17/51/0258   Ischemic dilated cardiomyopathy ([place]) 03/15/2017   Nonrheumatic aortic valve insufficiency 03/15/2017   Non-rheumatic mitral regurgitation 03/15/2017   S/P CABG (coronary artery bypass graft) 03/15/2017   Statin intolerance 03/15/2017   Dysphagia as late effect of cerebrovascular accident (CVA) 04/02/2016   Drug-induced skin rash 52/77/8242   Embolic stroke involving right middle cerebral artery ([place]) 03/25/2016   Atherosclerotic heart disease of native coronary artery without angina pectoris 10/02/2014   Essential (primary) hypertension 10/02/2014   Other specified postprocedural states 10/02/2014   History of mitral valve repair 2008   Hx of CABG 2008    Current Outpatient Medications on File Prior to Visit  Medication Sig Dispense Refill   aspirin EC 81 MG EC tablet Take 1 tablet (81 mg total) by mouth daily. Swallow whole. 30 tablet  11   clindamycin (CLEOCIN) 300 MG capsule Take 1 capsule (300 mg total) by mouth 3 (three) times daily. 30 capsule 0   clopidogrel (PLAVIX) 75 MG tablet Take 75 mg by mouth daily.     clotrimazole-betamethasone (LOTRISONE) cream APPLY TO RASH ON FEET TWICE DAILY 30 g 0   doxycycline (VIBRA-TABS) 100 MG tablet Take 1 tablet by mouth twice daily 20 tablet 0   fluticasone (FLONASE) 50 MCG/ACT nasal spray Place 2 sprays into both nostrils daily.     furosemide (LASIX) 20 MG tablet Take 1 tablet by mouth once daily 90 tablet 3   levothyroxine (SYNTHROID) 50 MCG tablet Take 50 mcg by mouth daily before breakfast.     MAGNESIUM CITRATE PO Take 400 mg by mouth in the morning, at noon, and at bedtime.     metoprolol tartrate (LOPRESSOR) 25 MG tablet Take 25 mg by mouth 2 (two) times daily. Pt takes a total of 75 mg daily     metoprolol tartrate (LOPRESSOR) 50 MG tablet Take 50 mg by mouth 2 (two) times daily. Pt takes a total of 75 mg daily     mupirocin ointment (BACTROBAN) 2 % Apply 1 application topically 3 (three) times daily.     nitroGLYCERIN (NITROSTAT) 0.4 MG SL tablet Place 0.4 mg under the tongue every 5 (five) minutes as needed for chest pain.     ondansetron (ZOFRAN ODT) 4 MG disintegrating tablet Take 1 tablet (4 mg total) by mouth every 8 (eight) hours as needed for nausea or vomiting. 20 tablet 0   pantoprazole (PROTONIX)  40 MG tablet Take 40 mg by mouth daily.     promethazine (PHENERGAN) 12.5 MG tablet Take 12.5 mg by mouth 4 (four) times daily as needed for nausea/vomiting.     Red Yeast Rice 600 MG CAPS Take 600 mg by mouth in the morning and at bedtime.     spironolactone (ALDACTONE) 25 MG tablet Take 0.5 tablets (12.5 mg total) by mouth daily. 30 tablet 0   Terbinafine HCl (LAMISIL AT SPRAY) 1 % SOLN Spray in between toes once daily at bedtime 125 mL 0   traMADol (ULTRAM) 50 MG tablet Take 50 mg by mouth every 6 (six) hours as needed for severe pain.     No current  facility-administered medications on file prior to visit.    Allergies  Allergen Reactions   Iodinated Contrast Media Rash   Ticagrelor Rash   Statins Other (See Comments)    myalgias  Other reaction(s): Other (see comments) myalgias myalgias    Objective:  General: Alert and oriented x3 in no acute distress  Dermatology:  There is granular wound at right hallux distal tuft that measures less than 0.5cm.  No erythema, no edema. Preulcerative callus noted to right heel noted signs of infection.  Rosy appearing raised rash noted to the dorsum of the right foot as well as the first webspace on the left and lateral heel likely consistent with dermatitis versus tinea with now a small partial abrasion noted at the left first webspace that remains similar to before with no signs of infection   Nails x10 are minimally elongated and thickened  Vascular: Dorsalis Pedis and Posterior Tibial pedal pulses nonpalpable  Temperature gradient decreased bilateral.  Purple discoloration to toes.  Neurology: Johney Maine sensation intact via light touch bilateral.  Musculoskeletal: No reproducible tenderness to palpation bilateral.  There is significant bunion and hammertoe deformity noted bilateral.  Pes planus deformity noted.   Assessment and Plan: Problem List Items Addressed This Visit   None Visit Diagnoses     Dermatitis    -  Primary   PAD (peripheral artery disease) ([place])       Pre-ulcerative calluses       Abrasion of second toe of left foot, subsequent encounter       Ulcer of great toe, right, limited to breakdown of skin (H[facility])       Foot pain, bilateral           -Complete examination performed -Nails debrided at no charge using sterile nail nipper without incident -Medihoney and clean white sock applied to right 1st toe -D/c cream in between toes but may use Lamisil spray to use in between the toes as directed -Betadine is recommended to left 2nd toe -Advised patient to use cotton  spacing during the day when in shoes to prevent rubbing of toes at left 2nd toe -Advised patient's daughter (via hand written note) on care as above -Return as scheduled or sooner if problems arise.  Landis Martins, DPM

## 2021-03-09 DIAGNOSIS — I509 Heart failure, unspecified: Secondary | ICD-10-CM | POA: Diagnosis not present

## 2021-04-01 ENCOUNTER — Other Ambulatory Visit: Payer: Self-pay

## 2021-04-01 ENCOUNTER — Encounter: Payer: Self-pay | Admitting: Sports Medicine

## 2021-04-01 ENCOUNTER — Ambulatory Visit: Payer: Medicare Other | Admitting: Sports Medicine

## 2021-04-01 DIAGNOSIS — S90415D Abrasion, left lesser toe(s), subsequent encounter: Secondary | ICD-10-CM

## 2021-04-01 DIAGNOSIS — L97511 Non-pressure chronic ulcer of other part of right foot limited to breakdown of skin: Secondary | ICD-10-CM

## 2021-04-01 DIAGNOSIS — I739 Peripheral vascular disease, unspecified: Secondary | ICD-10-CM

## 2021-04-01 DIAGNOSIS — M79671 Pain in right foot: Secondary | ICD-10-CM | POA: Diagnosis not present

## 2021-04-01 DIAGNOSIS — M79672 Pain in left foot: Secondary | ICD-10-CM | POA: Diagnosis not present

## 2021-04-01 NOTE — Progress Notes (Signed)
Subjective: Olivia [name] is a 88 y.o. female patient who returns to the office for follow-up wound care.  Patient reports that she is doing good has a little bit of tingling to the toes but otherwise doing okay.  No other pedal complaints noted.  Patient is assisted by son today who reports that daughter did not send a note with any questions for today's visit.  Patient Active Problem List   Diagnosis Date Noted   Breast cancer [facility])    CAD (coronary artery disease)    Chronic systolic heart failure ([place])    Hyperlipidemia with target LDL less than 70    Hypertension    Malignant neoplasm of upper-outer quadrant of right female breast ([place])    PVD (peripheral vascular disease) ([place])    Senile osteoporosis    Dyspnea    Protein-calorie malnutrition, severe 11/23/2019   Elevated troponin    Age-related osteoporosis without current pathological fracture 10/29/2019   Atypical ductal hyperplasia of left breast 10/12/2018    Class: Diagnosis of   Long-term use of aspirin therapy 07/20/2017   Mixed hyperlipidemia 05/28/2017   Old MI (myocardial infarction) 05/28/2017   Palpitations 05/28/2017   CHF (congestive heart failure), NYHA class III, acute, systolic ([place]) 62/13/0865   Ischemic dilated cardiomyopathy ([place]) 03/15/2017   Nonrheumatic aortic valve insufficiency 03/15/2017   Non-rheumatic mitral regurgitation 03/15/2017   S/P CABG (coronary artery bypass graft) 03/15/2017   Statin intolerance 03/15/2017   Dysphagia as late effect of cerebrovascular accident (CVA) 04/02/2016   Drug-induced skin rash 78/46/9629   Embolic stroke involving right middle cerebral artery ([place]) 03/25/2016   Atherosclerotic heart disease of native coronary artery without angina pectoris 10/02/2014   Essential (primary) hypertension 10/02/2014   Other specified postprocedural states 10/02/2014   History of mitral valve repair 2008   Hx of CABG 2008    Current Outpatient Medications on File Prior to Visit   Medication Sig Dispense Refill   aspirin EC 81 MG EC tablet Take 1 tablet (81 mg total) by mouth daily. Swallow whole. 30 tablet 11   clindamycin (CLEOCIN) 300 MG capsule Take 1 capsule (300 mg total) by mouth 3 (three) times daily. 30 capsule 0   clopidogrel (PLAVIX) 75 MG tablet Take 75 mg by mouth daily.     clotrimazole-betamethasone (LOTRISONE) cream APPLY TO RASH ON FEET TWICE DAILY 30 g 0   doxycycline (VIBRA-TABS) 100 MG tablet Take 1 tablet by mouth twice daily 20 tablet 0   fluticasone (FLONASE) 50 MCG/ACT nasal spray Place 2 sprays into both nostrils daily.     FLUZONE HIGH-DOSE QUADRIVALENT 0.7 ML SUSY      furosemide (LASIX) 20 MG tablet Take 1 tablet by mouth once daily 90 tablet 3   levothyroxine (SYNTHROID) 50 MCG tablet Take 50 mcg by mouth daily before breakfast.     MAGNESIUM CITRATE PO Take 400 mg by mouth in the morning, at noon, and at bedtime.     metoprolol tartrate (LOPRESSOR) 25 MG tablet Take 25 mg by mouth 2 (two) times daily. Pt takes a total of 75 mg daily     metoprolol tartrate (LOPRESSOR) 50 MG tablet Take 50 mg by mouth 2 (two) times daily. Pt takes a total of 75 mg daily     mupirocin ointment (BACTROBAN) 2 % Apply 1 application topically 3 (three) times daily.     nitroGLYCERIN (NITROSTAT) 0.4 MG SL tablet Place 0.4 mg under the tongue every 5 (five) minutes as needed for chest  pain.     ondansetron (ZOFRAN ODT) 4 MG disintegrating tablet Take 1 tablet (4 mg total) by mouth every 8 (eight) hours as needed for nausea or vomiting. 20 tablet 0   pantoprazole (PROTONIX) 40 MG tablet Take 40 mg by mouth daily.     promethazine (PHENERGAN) 12.5 MG tablet Take 12.5 mg by mouth 4 (four) times daily as needed for nausea/vomiting.     Red Yeast Rice 600 MG CAPS Take 600 mg by mouth in the morning and at bedtime.     spironolactone (ALDACTONE) 25 MG tablet Take 0.5 tablets (12.5 mg total) by mouth daily. 30 tablet 0   Terbinafine HCl (LAMISIL AT SPRAY) 1 % SOLN Spray in  between toes once daily at bedtime 125 mL 0   traMADol (ULTRAM) 50 MG tablet Take 50 mg by mouth every 6 (six) hours as needed for severe pain.     No current facility-administered medications on file prior to visit.    Allergies  Allergen Reactions   Iodinated Contrast Media Rash   Ticagrelor Rash   Statins Other (See Comments)    myalgias  Other reaction(s): Other (see comments) myalgias myalgias    Objective:  General: Alert and oriented x3 in no acute distress  Dermatology:  There is granular wound at right hallux distal tuft that measures approximately less than 0.5 cm once debrided there was some mild active bloody drainage, no erythema, no edema.  Grossly rash has now resolved at the toes and at the interspaces.  There is a preulcerative lesion/healed abrasion at the left second toe at the medial aspect with no active drainage or signs of infection at this time.  Nails x10 are short and thickened consistent with onychomycosis.  Vascular: Dorsalis Pedis and Posterior Tibial pedal pulses nonpalpable  Temperature gradient decreased bilateral.  Purple discoloration to toes unchanged from prior.  Neurology: Johney Maine sensation intact via light touch bilateral.  Musculoskeletal: No reproducible tenderness to palpation bilateral.  There is significant bunion and hammertoe deformity noted bilateral.  Pes planus deformity noted.   Assessment and Plan: Problem List Items Addressed This Visit   None Visit Diagnoses     Ulcer of great toe, right, limited to breakdown of skin ([place])    -  Primary   PAD (peripheral artery disease) ([place])       Abrasion of second toe of left foot, subsequent encounter       Foot pain, bilateral           -Complete examination performed -Mechanically debrided any loose skin noted at the right hallux distal tuft there was some mild bleeding that was treated with silver nitrate and dressed with gauze and loose sock and advised patient to have daughter to  apply Medihoney and loose sock starting on Thursday every other day -Recommend for preventative measures Betadine to left second toe with gauze or cotton spacing during the day -Written orders were provided to patient to give to the daughter to do wound care as above -Return in 3 to 4 weeks for follow-up evaluation or sooner problems or issues arise.  Landis Martins, DPM

## 2021-04-01 NOTE — Patient Instructions (Signed)
Wound care  Today I applied Silver nitrate to the right 1st toe that is the reason why it is dark/black on Thursday you may resume using medihoney, twice per week and then cover with gauze and loose sock To the left 2nd toe apply betadine and guaze to separate the toes to prevent rubbing, this should be changed daily Thanks Dr. Cannon Kettle [phone] office

## 2021-04-04 ENCOUNTER — Other Ambulatory Visit: Payer: Self-pay | Admitting: Sports Medicine

## 2021-04-06 DIAGNOSIS — I509 Heart failure, unspecified: Secondary | ICD-10-CM | POA: Diagnosis not present

## 2021-04-08 DIAGNOSIS — I272 Pulmonary hypertension, unspecified: Secondary | ICD-10-CM | POA: Diagnosis not present

## 2021-04-08 DIAGNOSIS — E782 Mixed hyperlipidemia: Secondary | ICD-10-CM | POA: Diagnosis not present

## 2021-04-08 DIAGNOSIS — I255 Ischemic cardiomyopathy: Secondary | ICD-10-CM | POA: Diagnosis not present

## 2021-04-08 DIAGNOSIS — Z7982 Long term (current) use of aspirin: Secondary | ICD-10-CM | POA: Diagnosis not present

## 2021-04-08 DIAGNOSIS — Z9889 Other specified postprocedural states: Secondary | ICD-10-CM | POA: Diagnosis not present

## 2021-04-08 DIAGNOSIS — I10 Essential (primary) hypertension: Secondary | ICD-10-CM | POA: Diagnosis not present

## 2021-04-08 DIAGNOSIS — I2510 Atherosclerotic heart disease of native coronary artery without angina pectoris: Secondary | ICD-10-CM | POA: Diagnosis not present

## 2021-04-08 DIAGNOSIS — Z789 Other specified health status: Secondary | ICD-10-CM | POA: Diagnosis not present

## 2021-04-08 DIAGNOSIS — Z951 Presence of aortocoronary bypass graft: Secondary | ICD-10-CM | POA: Diagnosis not present

## 2021-04-22 ENCOUNTER — Other Ambulatory Visit: Payer: Self-pay

## 2021-04-22 ENCOUNTER — Ambulatory Visit: Payer: Medicare Other | Admitting: Sports Medicine

## 2021-04-22 ENCOUNTER — Encounter: Payer: Self-pay | Admitting: Sports Medicine

## 2021-04-22 DIAGNOSIS — M79671 Pain in right foot: Secondary | ICD-10-CM

## 2021-04-22 DIAGNOSIS — S90415D Abrasion, left lesser toe(s), subsequent encounter: Secondary | ICD-10-CM | POA: Diagnosis not present

## 2021-04-22 DIAGNOSIS — L97511 Non-pressure chronic ulcer of other part of right foot limited to breakdown of skin: Secondary | ICD-10-CM

## 2021-04-22 DIAGNOSIS — M79672 Pain in left foot: Secondary | ICD-10-CM

## 2021-04-22 DIAGNOSIS — I739 Peripheral vascular disease, unspecified: Secondary | ICD-10-CM | POA: Diagnosis not present

## 2021-04-22 NOTE — Progress Notes (Addendum)
Subjective: ?Olivia [name] is a 88 y.o. female patient who returns to the office for follow-up wound care.  Patient reports that she is doing okay still has a little bit of soreness on the right big toe.  Patient's is about 7 who reports that daughter is still doing Medihoney to the right big toe and has been doing a padded Band-Aid to the left second toe. ? ?Patient Active Problem List  ? Diagnosis Date Noted  ? Breast cancer ([place])   ? CAD (coronary artery disease)   ? Chronic systolic heart failure ([place])   ? Hyperlipidemia with target LDL less than 70   ? Hypertension   ? Malignant neoplasm of upper-outer quadrant of right female breast ([place])   ? PVD (peripheral vascular disease) ([place])   ? Senile osteoporosis   ? Dyspnea   ? Protein-calorie malnutrition, severe 11/23/2019  ? Elevated troponin   ? Age-related osteoporosis without current pathological fracture 10/29/2019  ? Atypical ductal hyperplasia of left breast 10/12/2018  ?  Class: Diagnosis of  ? Long-term use of aspirin therapy 07/20/2017  ? Mixed hyperlipidemia 05/28/2017  ? Old MI (myocardial infarction) 05/28/2017  ? Palpitations 05/28/2017  ? CHF (congestive heart failure), NYHA class III, acute, systolic ([place]) 62/70/3500  ? Ischemic dilated cardiomyopathy ([place]) 03/15/2017  ? Nonrheumatic aortic valve insufficiency 03/15/2017  ? Non-rheumatic mitral regurgitation 03/15/2017  ? S/P CABG (coronary artery bypass graft) 03/15/2017  ? Statin intolerance 03/15/2017  ? Dysphagia as late effect of cerebrovascular accident (CVA) 04/02/2016  ? Drug-induced skin rash 03/28/2016  ? Embolic stroke involving right middle cerebral artery ([place]) 03/25/2016  ? Atherosclerotic heart disease of native coronary artery without angina pectoris 10/02/2014  ? Essential (primary) hypertension 10/02/2014  ? Other specified postprocedural states 10/02/2014  ? History of mitral valve repair 2008  ? Hx of CABG 2008  ? ? ?Current Outpatient Medications on File Prior to Visit   ?Medication Sig Dispense Refill  ? levothyroxine (SYNTHROID) 50 MCG tablet Take by mouth.    ? aspirin EC 81 MG EC tablet Take 1 tablet (81 mg total) by mouth daily. Swallow whole. 30 tablet 11  ? clindamycin (CLEOCIN) 300 MG capsule Take 1 capsule (300 mg total) by mouth 3 (three) times daily. 30 capsule 0  ? clopidogrel (PLAVIX) 75 MG tablet Take 75 mg by mouth daily.    ? clotrimazole-betamethasone (LOTRISONE) cream APPLY TO RASH ON FEET TWICE DAILY. 30 g 0  ? doxycycline (VIBRA-TABS) 100 MG tablet Take 1 tablet by mouth twice daily 20 tablet 0  ? fluticasone (FLONASE) 50 MCG/ACT nasal spray Place 2 sprays into both nostrils daily.    ? FLUZONE HIGH-DOSE QUADRIVALENT 0.7 ML SUSY     ? furosemide (LASIX) 20 MG tablet Take 1 tablet by mouth once daily 90 tablet 3  ? levothyroxine (SYNTHROID) 50 MCG tablet Take 50 mcg by mouth daily before breakfast.    ? lisinopril (ZESTRIL) 2.5 MG tablet Take 2.5 mg by mouth daily.    ? MAGNESIUM CITRATE PO Take 400 mg by mouth in the morning, at noon, and at bedtime.    ? metoprolol tartrate (LOPRESSOR) 25 MG tablet Take 25 mg by mouth 2 (two) times daily. Pt takes a total of 75 mg daily    ? metoprolol tartrate (LOPRESSOR) 50 MG tablet Take 50 mg by mouth 2 (two) times daily. Pt takes a total of 75 mg daily    ? mupirocin ointment (BACTROBAN) 2 % Apply 1 application topically  3 (three) times daily.    ? nitroGLYCERIN (NITROSTAT) 0.4 MG SL tablet Place 0.4 mg under the tongue every 5 (five) minutes as needed for chest pain.    ? ondansetron (ZOFRAN ODT) 4 MG disintegrating tablet Take 1 tablet (4 mg total) by mouth every 8 (eight) hours as needed for nausea or vomiting. 20 tablet 0  ? ondansetron (ZOFRAN) 4 MG tablet Take by mouth.    ? pantoprazole (PROTONIX) 40 MG tablet Take 40 mg by mouth daily.    ? promethazine (PHENERGAN) 12.5 MG tablet Take 12.5 mg by mouth 4 (four) times daily as needed for nausea/vomiting.    ? promethazine (PHENERGAN) 12.5 MG tablet Take by mouth.     ? Red Yeast Rice 600 MG CAPS Take 600 mg by mouth in the morning and at bedtime.    ? rosuvastatin (CRESTOR) 5 MG tablet Take 5 mg by mouth daily.    ? spironolactone (ALDACTONE) 25 MG tablet Take 0.5 tablets (12.5 mg total) by mouth daily. 30 tablet 0  ? Terbinafine HCl (LAMISIL AT SPRAY) 1 % SOLN Spray in between toes once daily at bedtime 125 mL 0  ? traMADol (ULTRAM) 50 MG tablet Take 50 mg by mouth every 6 (six) hours as needed for severe pain.    ? traMADol (ULTRAM) 50 MG tablet Take 1 tablet by mouth every 6 (six) hours as needed.    ? ?No current facility-administered medications on file prior to visit.  ? ? ?Allergies  ?Allergen Reactions  ? Iodinated Contrast Media Rash  ? Ticagrelor Rash  ? Statins Other (See Comments)  ?  myalgias ? ?Other reaction(s): Other (see comments) ?myalgias ?myalgias  ? ? ?Objective:  ?General: Alert and oriented x3 in no acute distress ? ?Dermatology:  There is granular wound at right hallux distal tuft that measures 0.3 x 0.2 cm once debrided there was some mild active bloody drainage, no erythema, no edema.  There is a preulcerative lesion/healed abrasion at the left second toe at the medial aspect with no active drainage or signs of infection at this time skin appears slightly macerated from patient applying silicone Band-Aid/corn pad to the area. ? ?Nails x10 are short and thickened consistent with onychomycosis. ? ?Vascular: Dorsalis Pedis and Posterior Tibial pedal pulses nonpalpable  Temperature gradient decreased bilateral.  Purple discoloration to toes unchanged from prior. ? ?Neurology: Gross sensation intact via light touch bilateral. ? ?Musculoskeletal: No reproducible tenderness to palpation bilateral.  There is significant bunion and hammertoe deformity noted bilateral.  Pes planus deformity noted. ? ? ?Assessment and Plan: ?Problem List Items Addressed This Visit   ?None ?Visit Diagnoses   ? ? Ulcer of great toe, right, limited to breakdown of skin ([place])    -   Primary  ? PAD (peripheral artery disease) ([place])      ? Relevant Medications  ? lisinopril (ZESTRIL) 2.5 MG tablet  ? rosuvastatin (CRESTOR) 5 MG tablet  ? Abrasion of second toe of left foot, subsequent encounter      ? Foot pain, bilateral      ? ?  ? ? ?-Complete examination performed ?-Mechanically debrided any loose skin noted at the right hallux distal tuft there was some mild bleeding that was treated with silver nitrate and dressed with gauze and loose sock and advised patient to have daughter to apply Medihoney and loose sock starting on Saturday twice a week ?-Recommend for preventative measures roll guaze or cotton padding to space toes and to  refrain from using corn pad or silicone Band-Aid to the area ?-Written orders were provided to patient to give to the daughter to do wound care as above ?-Return in 3 to 4 weeks for follow-up evaluation or sooner problems or issues arise. ? ?Landis Martins, DPM ? ?

## 2021-04-22 NOTE — Patient Instructions (Signed)
Apply Medihoney to the right great toe and a clean sock dressing twice a week starting on Saturday.  Today I applied silver nitrate to the right great toe that is why it is dark in color.  ?Refrain from using silicone or corn pad to left second toe and apply web roll cotton padding in between the toes once daily and remove at bedtime ?

## 2021-04-24 DIAGNOSIS — I10 Essential (primary) hypertension: Secondary | ICD-10-CM | POA: Diagnosis not present

## 2021-04-24 DIAGNOSIS — I255 Ischemic cardiomyopathy: Secondary | ICD-10-CM | POA: Diagnosis not present

## 2021-04-24 DIAGNOSIS — I2510 Atherosclerotic heart disease of native coronary artery without angina pectoris: Secondary | ICD-10-CM | POA: Diagnosis not present

## 2021-04-25 ENCOUNTER — Other Ambulatory Visit: Payer: Self-pay | Admitting: Sports Medicine

## 2021-04-25 NOTE — Telephone Encounter (Signed)
Please advise 

## 2021-05-06 DIAGNOSIS — R002 Palpitations: Secondary | ICD-10-CM | POA: Diagnosis not present

## 2021-05-06 DIAGNOSIS — I2510 Atherosclerotic heart disease of native coronary artery without angina pectoris: Secondary | ICD-10-CM | POA: Diagnosis not present

## 2021-05-07 DIAGNOSIS — I509 Heart failure, unspecified: Secondary | ICD-10-CM | POA: Diagnosis not present

## 2021-05-09 DIAGNOSIS — I255 Ischemic cardiomyopathy: Secondary | ICD-10-CM | POA: Diagnosis not present

## 2021-05-09 DIAGNOSIS — I272 Pulmonary hypertension, unspecified: Secondary | ICD-10-CM | POA: Diagnosis not present

## 2021-05-09 DIAGNOSIS — E782 Mixed hyperlipidemia: Secondary | ICD-10-CM | POA: Diagnosis not present

## 2021-05-09 DIAGNOSIS — Z7982 Long term (current) use of aspirin: Secondary | ICD-10-CM | POA: Diagnosis not present

## 2021-05-09 DIAGNOSIS — Z951 Presence of aortocoronary bypass graft: Secondary | ICD-10-CM | POA: Diagnosis not present

## 2021-05-09 DIAGNOSIS — Z9889 Other specified postprocedural states: Secondary | ICD-10-CM | POA: Diagnosis not present

## 2021-05-09 DIAGNOSIS — Z789 Other specified health status: Secondary | ICD-10-CM | POA: Diagnosis not present

## 2021-05-09 DIAGNOSIS — I10 Essential (primary) hypertension: Secondary | ICD-10-CM | POA: Diagnosis not present

## 2021-05-09 DIAGNOSIS — I2510 Atherosclerotic heart disease of native coronary artery without angina pectoris: Secondary | ICD-10-CM | POA: Diagnosis not present

## 2021-05-15 ENCOUNTER — Ambulatory Visit: Payer: Medicare Other

## 2021-05-15 ENCOUNTER — Other Ambulatory Visit: Payer: Self-pay | Admitting: Pharmacist

## 2021-05-15 ENCOUNTER — Ambulatory Visit: Payer: Medicare Other | Admitting: Hematology and Oncology

## 2021-05-15 ENCOUNTER — Other Ambulatory Visit: Payer: Medicare Other

## 2021-05-16 ENCOUNTER — Inpatient Hospital Stay: Payer: Medicare Other | Attending: Oncology

## 2021-05-16 ENCOUNTER — Inpatient Hospital Stay (INDEPENDENT_AMBULATORY_CARE_PROVIDER_SITE_OTHER): Payer: Medicare Other | Admitting: Oncology

## 2021-05-16 ENCOUNTER — Encounter: Payer: Self-pay | Admitting: Oncology

## 2021-05-16 ENCOUNTER — Inpatient Hospital Stay: Payer: Medicare Other

## 2021-05-16 ENCOUNTER — Other Ambulatory Visit: Payer: Self-pay

## 2021-05-16 ENCOUNTER — Telehealth: Payer: Self-pay | Admitting: Oncology

## 2021-05-16 VITALS — BP 139/58 | HR 71 | Temp 98.2°F | Resp 18 | Ht 64.0 in | Wt 126.0 lb

## 2021-05-16 VITALS — BP 130/43 | HR 66 | Temp 98.3°F | Resp 18 | Wt 126.1 lb

## 2021-05-16 DIAGNOSIS — M810 Age-related osteoporosis without current pathological fracture: Secondary | ICD-10-CM

## 2021-05-16 DIAGNOSIS — D539 Nutritional anemia, unspecified: Secondary | ICD-10-CM | POA: Insufficient documentation

## 2021-05-16 DIAGNOSIS — C50411 Malignant neoplasm of upper-outer quadrant of right female breast: Secondary | ICD-10-CM | POA: Diagnosis not present

## 2021-05-16 DIAGNOSIS — Z170 Estrogen receptor positive status [ER+]: Secondary | ICD-10-CM | POA: Diagnosis not present

## 2021-05-16 DIAGNOSIS — C50919 Malignant neoplasm of unspecified site of unspecified female breast: Secondary | ICD-10-CM | POA: Diagnosis not present

## 2021-05-16 LAB — COMPREHENSIVE METABOLIC PANEL
Albumin: 3.8 (ref 3.5–5.0)
Calcium: 8.8 (ref 8.7–10.7)

## 2021-05-16 LAB — BASIC METABOLIC PANEL
BUN: 25 — AB (ref 4–21)
CO2: 27 — AB (ref 13–22)
Chloride: 103 (ref 99–108)
Creatinine: 1.2 — AB (ref 0.5–1.1)
Glucose: 93
Potassium: 3.9 mEq/L (ref 3.5–5.1)
Sodium: 141 (ref 137–147)

## 2021-05-16 LAB — HEPATIC FUNCTION PANEL
ALT: 19 U/L (ref 7–35)
AST: 36 — AB (ref 13–35)
Alkaline Phosphatase: 51 (ref 25–125)
Bilirubin, Total: 0.6

## 2021-05-16 LAB — CBC AND DIFFERENTIAL
HCT: 35 — AB (ref 36–46)
Hemoglobin: 11.1 — AB (ref 12.0–16.0)
Neutrophils Absolute: 3.42
Platelets: 256 10*3/uL (ref 150–400)
WBC: 5.9

## 2021-05-16 LAB — CBC: RBC: 3.81 — AB (ref 3.87–5.11)

## 2021-05-16 MED ORDER — DENOSUMAB 60 MG/ML ~~LOC~~ SOSY
60.0000 mg | PREFILLED_SYRINGE | Freq: Once | SUBCUTANEOUS | Status: AC
  Administered 2021-05-16: 60 mg via SUBCUTANEOUS
  Filled 2021-05-16: qty 1

## 2021-05-16 NOTE — Progress Notes (Signed)
?[facility]  ?[address] ?[place],  [place]  [address] ?[phone] ? ?Clinic Day:  05/16/21 ? ?Referring physician: Nicoletta Dress, MD ? ?CHIEF COMPLAINT:  ?CC:  Stage II B hormone receptor positive breast cancer and age-related osteoporosis ? ?Current Treatment:    Prolia every 6 months ? ? ?HISTORY OF PRESENT ILLNESS:  ?Olivia [name] is a 87 y.o. 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  She was scheduled for a right partial mastectomy in February 2018, but had an acute myocardial infarction and stroke.  She required placement of 2 coronary artery stents.  The surgery was delayed until August.  Final pathology revealed multifocal invasive ductal carcinoma with a 2.4 cm lesion and a 1.2 cm lesion, both grade 2 with clear margins, as well as intermediate grade ductal carcinoma in situ.  One of 2 nodes was positive with extracapsular extension and lymphovascular invasion.  Estrogen and progesterone receptors were positive and her 2 Neu was negative.  Ki 67 was 10%.  Due to her other medical comorbidities, we did not feel she was a candidate for aggressive chemotherapy.  She was placed on anastrozole 1 mg daily in September 2018.  Bone density scan in September 2018 revealed osteoporosis with a statistically significant decrease in the bone mineral density compared to the prior study of 2005. Her T-score of the right femur was -3.6, which was 6.8% worse, with a T-score of -3.2 of the right forearm.  She was already taking calcium 600 mg twice daily.  Due to the risk of worsening osteopenia with anastrozole, she was also placed on alendronate 70 mg weekly.  She went to the hospital in early 2020 twice for bleeding ulcers. She had four blood transfusions.  She was due for mammography in May, but this was delayed due to COVID-19. ? ?Bilateral diagnostic mammogram in August of 2020 revealed a 3 cm area of  pleomorphic calcifications in the left breast.  There was no evidence of malignancy in the right breast.  Biopsy of the left breast lesion revealed focal atypical ductal hyperplasia with calcifications, with no in situ or invasive malignancy identified.  She underwent left lumpectomy in September.  Final pathology revealed atypical ductal hyperplasia and florid usual ductal hyperplasia with calcifications.  No in situ or invasive malignancy was identified.  Margins were clear.  Bone density from September 2020 revealed worsening osteoporosis with a T-score of -3.6 of the left femur, previously -3.4.  The right forearm measured -4.3, previously -3.2.  We therefore stopped the anastrozole after 2 years and switched to raloxifene for chemoprevention, which can also also treat her osteoporosis.  She was  placed on Prolia injections every 6 months for osteoporosis.  Due to severe arthralgias affecting her quality of life, we discontinued raloxifene in March 2021. She presented to the emergency department in mid September, and then again on October 2nd due to acute [name] injury.  CT urogram from October revealed a 7 mm obstructing proximal right ureteral calculus at the L3-4 level, with  moderate right hydronephrosis, and a 16 mm calculus in the left renal pelvis. There were additional nonobstructing bilateral renal calculi, and a 17 mm hyperdense lesion in the anterior right upper [name], possibly reflecting a hemorrhagic cyst.  She saw Dr. Nila Nephew he discontinued calcium and vitamin D.  She was scheduled for lithotripsy.   ? ?Annual bilateral mammogram and ultrasound from August 2022 revealed an  interval 1.7 x 1.5 x 0.4 cm irregular mass with pleomorphic calcifications in the skin and underlying breast in the 10 o'clock position of the right breast.  No evidence of malignancy elsewhere in either breast.  Excisional biopsy was pursued on August 15th and surgical pathology confirmed invasive ductal carcinoma, grade 2, and  high grade DCIS with comedo necrosis and calcifications.  Tumor measures 1.5 cm and is invasive within dermis and subcutaneous tissue.  Skin ulceration, lymphovascular invasion and perineural invasion identified.  Margins free of neoplasm.  This stages her as a stage IA (T1c N0 M0).  HER2 was equivocal 2+, but negative by FISH.  Estrogen receptor was positive at 100% and progesterone receptor was positive at 5%.  Ki67 was 15%.  ? ?INTERVAL HISTORY:  ?Olivia [name] is here for routine follow up and states that she has been well. She did contract COVID before Christmas.  Bone density from December 2022 revealed osteoporosis with a T-score of -3.5 of the right femur. Dual femur total mean measures -3.4, and left forearm measures -2.8. No significant interval change.  She is due for her denosumab injection today.  Her labs reveal a decrease in her hemoglobin from 12.9 to 11.1 with an MCV of 92.  Her comprehensive metabolic profile reveals a improvement of her creatinine from 1.5 to 1.2 with a BUN of 25.  The rest of her CBC and comprehensive metabolic profile are normal.  She does have appointments in August for annual mammography and a visit with her surgeon. Her  appetite is good, and her weight is stable since her last visit.  She denies fever, chills or other signs of infection.  She denies nausea, vomiting, bowel issues, or abdominal pain.  She denies sore throat, cough, dyspnea, or chest pain. ? ?REVIEW OF SYSTEMS:  ?Review of Systems  ?Constitutional: Negative.  Negative for appetite change, chills, fatigue, fever and unexpected weight change.  ?HENT:  Negative.    ?Eyes: Negative.   ?Respiratory: Negative.  Negative for chest tightness, cough, hemoptysis, shortness of breath and wheezing.   ?Cardiovascular: Negative.  Negative for chest pain, leg swelling and palpitations.  ?Gastrointestinal: Negative.  Negative for abdominal distention, abdominal pain, blood in stool, constipation, diarrhea, nausea and vomiting.   ?Endocrine: Negative.   ?Genitourinary: Negative.  Negative for difficulty urinating, dysuria, frequency and hematuria.   ?Musculoskeletal: Negative.  Negative for arthralgias, back pain, flank pain, gait problem and myalgias.  ?Skin: Negative.   ?Neurological: Negative.  Negative for dizziness, extremity weakness, gait problem, headaches, light-headedness, numbness, seizures and speech difficulty.  ?Hematological: Negative.   ?Psychiatric/Behavioral: Negative.  Negative for depression and sleep disturbance. The patient is not nervous/anxious.    ? ?VITALS:  ?Blood pressure (!) 139/58, pulse 71, temperature 98.2 ?F (36.8 ?C), temperature source Oral, resp. rate 18, height $RemoveBe'5\' 4"'CyaJHLoDy$  (1.626 m), weight 126 lb (57.2 kg), SpO2 100 %.  ?Wt Readings from Last 3 Encounters:  ?05/16/21 126 lb 1.3 oz (57.2 kg)  ?05/16/21 126 lb (57.2 kg)  ?02/14/21 126 lb 6.4 oz (57.3 kg)  ?  ?Body mass index is 21.63 kg/m?. ? ?Performance status (ECOG): 0 - Asymptomatic ? ?PHYSICAL EXAM:  ?Physical Exam ?Constitutional:   ?   General: She is not in acute distress. ?   Appearance: Normal appearance. She is normal weight.  ?HENT:  ?   Head: Normocephalic and atraumatic.  ?Eyes:  ?   General: No scleral icterus. ?   Extraocular Movements: Extraocular movements intact.  ?   Conjunctiva/sclera:  Conjunctivae normal.  ?   Pupils: Pupils are equal, round, and reactive to light.  ?Cardiovascular:  ?   Rate and Rhythm: Normal rate and regular rhythm.  ?   Pulses: Normal pulses.  ?   Heart sounds: Murmur heard.  ?Systolic murmur is present with a grade of 1/6.  ?  No friction rub. No gallop.  ?Pulmonary:  ?   Effort: Pulmonary effort is normal. No respiratory distress.  ?   Breath sounds: Normal breath sounds.  ?Chest:  ?   Comments: Scar tissue in the lateral aspect of the right breast. Fibrocystic disease throughout bilateral breasts, but worse on the right. She still has a persistent tiny nodule just lateral to the left nipple. ?Abdominal:  ?    General: Bowel sounds are normal. There is no distension.  ?   Palpations: Abdomen is soft. There is no hepatomegaly, splenomegaly or mass.  ?   Tenderness: There is no abdominal tenderness.  ?Musculoskeletal:     ?   Gene

## 2021-05-16 NOTE — Telephone Encounter (Signed)
Per 05/16/21 los next appt scheduled and confirmed with patient ?

## 2021-05-16 NOTE — Patient Instructions (Signed)
Denosumab injection ?What is this medication? ?DENOSUMAB (den oh sue mab) slows bone breakdown. Prolia is used to treat osteoporosis in women after menopause and in men, and in people who are taking corticosteroids for 6 months or more. Xgeva is used to treat a high calcium level due to cancer and to prevent bone fractures and other bone problems caused by multiple myeloma or cancer bone metastases. Xgeva is also used to treat giant cell tumor of the bone. ?This medicine may be used for other purposes; ask your health care provider or pharmacist if you have questions. ?COMMON BRAND NAME(S): Prolia, XGEVA ?What should I tell my care team before I take this medication? ?They need to know if you have any of these conditions: ?dental disease ?having surgery or tooth extraction ?infection ?kidney disease ?low levels of calcium or Vitamin D in the blood ?malnutrition ?on hemodialysis ?skin conditions or sensitivity ?thyroid or parathyroid disease ?an unusual reaction to denosumab, other medicines, foods, dyes, or preservatives ?pregnant or trying to get pregnant ?breast-feeding ?How should I use this medication? ?This medicine is for injection under the skin. It is given by a health care professional in a hospital or clinic setting. ?A special MedGuide will be given to you before each treatment. Be sure to read this information carefully each time. ?For Prolia, talk to your pediatrician regarding the use of this medicine in children. Special care may be needed. For Xgeva, talk to your pediatrician regarding the use of this medicine in children. While this drug may be prescribed for children as young as 13 years for selected conditions, precautions do apply. ?Overdosage: If you think you have taken too much of this medicine contact a poison control center or emergency room at once. ?NOTE: This medicine is only for you. Do not share this medicine with others. ?What if I miss a dose? ?It is important not to miss your dose.  Call your doctor or health care professional if you are unable to keep an appointment. ?What may interact with this medication? ?Do not take this medicine with any of the following medications: ?other medicines containing denosumab ?This medicine may also interact with the following medications: ?medicines that lower your chance of fighting infection ?steroid medicines like prednisone or cortisone ?This list may not describe all possible interactions. Give your health care provider a list of all the medicines, herbs, non-prescription drugs, or dietary supplements you use. Also tell them if you smoke, drink alcohol, or use illegal drugs. Some items may interact with your medicine. ?What should I watch for while using this medication? ?Visit your doctor or health care professional for regular checks on your progress. Your doctor or health care professional may order blood tests and other tests to see how you are doing. ?Call your doctor or health care professional for advice if you get a fever, chills or sore throat, or other symptoms of a cold or flu. Do not treat yourself. This drug may decrease your body's ability to fight infection. Try to avoid being around people who are sick. ?You should make sure you get enough calcium and vitamin D while you are taking this medicine, unless your doctor tells you not to. Discuss the foods you eat and the vitamins you take with your health care professional. ?See your dentist regularly. Brush and floss your teeth as directed. Before you have any dental work done, tell your dentist you are receiving this medicine. ?Do not become pregnant while taking this medicine or for 5 months after   stopping it. Talk with your doctor or health care professional about your birth control options while taking this medicine. Women should inform their doctor if they wish to become pregnant or think they might be pregnant. There is a potential for serious side effects to an unborn child. Talk to  your health care professional or pharmacist for more information. ?What side effects may I notice from receiving this medication? ?Side effects that you should report to your doctor or health care professional as soon as possible: ?allergic reactions like skin rash, itching or hives, swelling of the face, lips, or tongue ?bone pain ?breathing problems ?dizziness ?jaw pain, especially after dental work ?redness, blistering, peeling of the skin ?signs and symptoms of infection like fever or chills; cough; sore throat; pain or trouble passing urine ?signs of low calcium like fast heartbeat, muscle cramps or muscle pain; pain, tingling, numbness in the hands or feet; seizures ?unusual bleeding or bruising ?unusually weak or tired ?Side effects that usually do not require medical attention (report to your doctor or health care professional if they continue or are bothersome): ?constipation ?diarrhea ?headache ?joint pain ?loss of appetite ?muscle pain ?runny nose ?tiredness ?upset stomach ?This list may not describe all possible side effects. Call your doctor for medical advice about side effects. You may report side effects to FDA at 1-800-FDA-1088. ?Where should I keep my medication? ?This medicine is only given in a clinic, doctor's office, or other health care setting and will not be stored at home. ?NOTE: This sheet is a summary. It may not cover all possible information. If you have questions about this medicine, talk to your doctor, pharmacist, or health care provider. ?? 2023 Elsevier/Gold Standard (2017-05-28 00:00:00) ? ?

## 2021-05-20 ENCOUNTER — Encounter: Payer: Self-pay | Admitting: Sports Medicine

## 2021-05-20 ENCOUNTER — Ambulatory Visit: Payer: Medicare Other | Admitting: Sports Medicine

## 2021-05-20 DIAGNOSIS — L97511 Non-pressure chronic ulcer of other part of right foot limited to breakdown of skin: Secondary | ICD-10-CM

## 2021-05-20 DIAGNOSIS — L84 Corns and callosities: Secondary | ICD-10-CM

## 2021-05-20 DIAGNOSIS — I739 Peripheral vascular disease, unspecified: Secondary | ICD-10-CM

## 2021-05-20 NOTE — Patient Instructions (Signed)
Apply silver nitrate sticks (2) to right great toe once a week to the wound and cover with clean white sock ?Stop using Medihoney ?Any ?s call Dr. Cannon Kettle ?[phone] office ?

## 2021-05-20 NOTE — Progress Notes (Signed)
Subjective: ?Olivia [name] is a 88 y.o. female patient who returns to the office for follow-up wound care.  Patient reports that she is doing okay still has a little bit of soreness on the right big toe but otherwise doing good has daughter using Medihoney once to twice weekly to the right great toe. ? ?Patient Active Problem List  ? Diagnosis Date Noted  ? Breast cancer ([place])   ? CAD (coronary artery disease)   ? Chronic systolic heart failure ([place])   ? Hyperlipidemia with target LDL less than 70   ? Hypertension   ? Malignant neoplasm of upper-outer quadrant of right female breast ([place])   ? PVD (peripheral vascular disease) ([place])   ? Senile osteoporosis   ? Dyspnea   ? Protein-calorie malnutrition, severe 11/23/2019  ? Elevated troponin   ? Age-related osteoporosis without current pathological fracture 10/29/2019  ? Atypical ductal hyperplasia of left breast 10/12/2018  ?  Class: Diagnosis of  ? Long-term use of aspirin therapy 07/20/2017  ? Mixed hyperlipidemia 05/28/2017  ? Old MI (myocardial infarction) 05/28/2017  ? Palpitations 05/28/2017  ? CHF (congestive heart failure), NYHA class III, acute, systolic ([place]) 93/23/5573  ? Ischemic dilated cardiomyopathy ([place]) 03/15/2017  ? Nonrheumatic aortic valve insufficiency 03/15/2017  ? Non-rheumatic mitral regurgitation 03/15/2017  ? S/P CABG (coronary artery bypass graft) 03/15/2017  ? Statin intolerance 03/15/2017  ? Dysphagia as late effect of cerebrovascular accident (CVA) 04/02/2016  ? Drug-induced skin rash 03/28/2016  ? Embolic stroke involving right middle cerebral artery ([place]) 03/25/2016  ? Atherosclerotic heart disease of native coronary artery without angina pectoris 10/02/2014  ? Essential (primary) hypertension 10/02/2014  ? Other specified postprocedural states 10/02/2014  ? History of mitral valve repair 2008  ? Hx of CABG 2008  ? ? ?Current Outpatient Medications on File Prior to Visit  ?Medication Sig Dispense Refill  ? aspirin EC 81 MG EC tablet  Take 1 tablet (81 mg total) by mouth daily. Swallow whole. 30 tablet 11  ? clopidogrel (PLAVIX) 75 MG tablet Take 75 mg by mouth daily.    ? clotrimazole-betamethasone (LOTRISONE) cream APPLY TO RASH ON FEET TWICE DAILY. 30 g 0  ? doxycycline (VIBRA-TABS) 100 MG tablet Take 1 tablet by mouth twice daily 20 tablet 0  ? fluticasone (FLONASE) 50 MCG/ACT nasal spray Place 2 sprays into both nostrils daily.    ? FLUZONE HIGH-DOSE QUADRIVALENT 0.7 ML SUSY     ? furosemide (LASIX) 20 MG tablet Take 1 tablet by mouth once daily 90 tablet 3  ? levothyroxine (SYNTHROID) 50 MCG tablet Take 50 mcg by mouth daily before breakfast.    ? lisinopril (ZESTRIL) 2.5 MG tablet Take 2.5 mg by mouth daily.    ? MAGNESIUM CITRATE PO Take 400 mg by mouth in the morning, at noon, and at bedtime.    ? metoprolol tartrate (LOPRESSOR) 25 MG tablet Take 25 mg by mouth 2 (two) times daily. Pt takes a total of 75 mg daily    ? metoprolol tartrate (LOPRESSOR) 50 MG tablet Take 50 mg by mouth 2 (two) times daily. Pt takes a total of 75 mg daily    ? mupirocin ointment (BACTROBAN) 2 % Apply 1 application topically 3 (three) times daily.    ? nitroGLYCERIN (NITROSTAT) 0.4 MG SL tablet Place 0.4 mg under the tongue every 5 (five) minutes as needed for chest pain.    ? ondansetron (ZOFRAN ODT) 4 MG disintegrating tablet Take 1 tablet (4 mg total) by  mouth every 8 (eight) hours as needed for nausea or vomiting. 20 tablet 0  ? pantoprazole (PROTONIX) 40 MG tablet Take 40 mg by mouth daily.    ? promethazine (PHENERGAN) 12.5 MG tablet Take 12.5 mg by mouth 4 (four) times daily as needed for nausea/vomiting.    ? Red Yeast Rice 600 MG CAPS Take 600 mg by mouth in the morning and at bedtime.    ? rosuvastatin (CRESTOR) 5 MG tablet Take 5 mg by mouth daily.    ? spironolactone (ALDACTONE) 25 MG tablet Take 0.5 tablets (12.5 mg total) by mouth daily. 30 tablet 0  ? Terbinafine HCl (LAMISIL AT SPRAY) 1 % SOLN Spray in between toes once daily at bedtime 125 mL  0  ? traMADol (ULTRAM) 50 MG tablet Take 50 mg by mouth every 6 (six) hours as needed for severe pain.    ? ?No current facility-administered medications on file prior to visit.  ? ? ?Allergies  ?Allergen Reactions  ? Iodinated Contrast Media Rash  ? Ticagrelor Rash  ? Statins Other (See Comments)  ?  myalgias ? ?Other reaction(s): Other (see comments) ?myalgias ?myalgias  ? ? ?Objective:  ?General: Alert and oriented x3 in no acute distress ? ?Dermatology:  There is granular wound at right hallux distal tuft that measures 0.4 x 0.3 cm once debrided there was some mild active bloody drainage, no erythema, no edema.  Preulcerative callus to the plantar right heel.  Previous left second toe wound has now healed. ? ?Nails x10 are short and thickened consistent with onychomycosis. ? ?Vascular: Dorsalis Pedis and Posterior Tibial pedal pulses nonpalpable  Temperature gradient decreased bilateral.  Purple discoloration to toes unchanged from prior. ? ?Neurology: Gross sensation intact via light touch bilateral. ? ?Musculoskeletal: No reproducible tenderness to palpation bilateral.  There is significant bunion and hammertoe deformity noted bilateral.  Pes planus deformity noted. ? ? ?Assessment and Plan: ?Problem List Items Addressed This Visit   ?None ?Visit Diagnoses   ? ? Ulcer of great toe, right, limited to breakdown of skin ([place])    -  Primary  ? PAD (peripheral artery disease) ([place])      ? Pre-ulcerative calluses      ? ?  ? ? ?-Complete examination performed ?-Mechanically debrided any loose skin noted at the right hallux distal tuft there was some mild bleeding that was treated with silver nitrate and dressed with gauze and loose sock and advised patient to have daughter to apply silver nitrate once weekly and should discontinue Medihoney at this time at the right great toe. ?-Written orders provided to daughter to do wound care as above ?-Continue with shoes that do not rub toes and heel cushions as provided at  this visit ?-Return in 3 to 4 weeks for follow-up wound check or sooner problems or issues arise. ? ?Landis Martins, DPM ? ?

## 2021-05-27 ENCOUNTER — Encounter: Payer: Self-pay | Admitting: Oncology

## 2021-06-04 DIAGNOSIS — J019 Acute sinusitis, unspecified: Secondary | ICD-10-CM | POA: Diagnosis not present

## 2021-06-04 DIAGNOSIS — B9689 Other specified bacterial agents as the cause of diseases classified elsewhere: Secondary | ICD-10-CM | POA: Diagnosis not present

## 2021-06-06 DIAGNOSIS — I509 Heart failure, unspecified: Secondary | ICD-10-CM | POA: Diagnosis not present

## 2021-06-17 ENCOUNTER — Ambulatory Visit: Payer: Medicare Other | Admitting: Sports Medicine

## 2021-06-17 ENCOUNTER — Encounter: Payer: Self-pay | Admitting: Sports Medicine

## 2021-06-17 DIAGNOSIS — L97514 Non-pressure chronic ulcer of other part of right foot with necrosis of bone: Secondary | ICD-10-CM | POA: Diagnosis not present

## 2021-06-17 DIAGNOSIS — L84 Corns and callosities: Secondary | ICD-10-CM

## 2021-06-17 DIAGNOSIS — M79674 Pain in right toe(s): Secondary | ICD-10-CM

## 2021-06-17 DIAGNOSIS — R234 Changes in skin texture: Secondary | ICD-10-CM

## 2021-06-17 DIAGNOSIS — I739 Peripheral vascular disease, unspecified: Secondary | ICD-10-CM

## 2021-06-17 NOTE — Patient Instructions (Signed)
Remove bandage on right great toe tonight and then if there is still bleeding apply 1 silver nitrate stick to the area. Then on next week start applying the silver nitrate stick twice a week (once on Monday and then again on Friday) cover the a clean sock and leave toe open to air at bedtime ?

## 2021-06-17 NOTE — Progress Notes (Signed)
Subjective: ?Olivia [name] is a 88 y.o. female patient who returns to the office for follow-up wound care.  Patient reports that toe still sore and daughter has been applying the silver nitrate as I have directed to the tip of the toe which seems to be helping. ? ?Patient Active Problem List  ? Diagnosis Date Noted  ? Breast cancer ([place])   ? CAD (coronary artery disease)   ? Chronic systolic heart failure ([place])   ? Hyperlipidemia with target LDL less than 70   ? Hypertension   ? Malignant neoplasm of upper-outer quadrant of right female breast ([place])   ? PVD (peripheral vascular disease) ([place])   ? Senile osteoporosis   ? Dyspnea   ? Protein-calorie malnutrition, severe 11/23/2019  ? Elevated troponin   ? Age-related osteoporosis without current pathological fracture 10/29/2019  ? Atypical ductal hyperplasia of left breast 10/12/2018  ?  Class: Diagnosis of  ? Long-term use of aspirin therapy 07/20/2017  ? Mixed hyperlipidemia 05/28/2017  ? Old MI (myocardial infarction) 05/28/2017  ? Palpitations 05/28/2017  ? CHF (congestive heart failure), NYHA class III, acute, systolic ([place]) 29/92/4268  ? Ischemic dilated cardiomyopathy ([place]) 03/15/2017  ? Nonrheumatic aortic valve insufficiency 03/15/2017  ? Non-rheumatic mitral regurgitation 03/15/2017  ? S/P CABG (coronary artery bypass graft) 03/15/2017  ? Statin intolerance 03/15/2017  ? Dysphagia as late effect of cerebrovascular accident (CVA) 04/02/2016  ? Drug-induced skin rash 03/28/2016  ? Embolic stroke involving right middle cerebral artery ([place]) 03/25/2016  ? Atherosclerotic heart disease of native coronary artery without angina pectoris 10/02/2014  ? Essential (primary) hypertension 10/02/2014  ? Other specified postprocedural states 10/02/2014  ? History of mitral valve repair 2008  ? Hx of CABG 2008  ? ? ?Current Outpatient Medications on File Prior to Visit  ?Medication Sig Dispense Refill  ? aspirin EC 81 MG EC tablet Take 1 tablet (81 mg total) by mouth  daily. Swallow whole. 30 tablet 11  ? clopidogrel (PLAVIX) 75 MG tablet Take 75 mg by mouth daily.    ? clotrimazole-betamethasone (LOTRISONE) cream APPLY TO RASH ON FEET TWICE DAILY. 30 g 0  ? fluticasone (FLONASE) 50 MCG/ACT nasal spray Place 2 sprays into both nostrils daily.    ? FLUZONE HIGH-DOSE QUADRIVALENT 0.7 ML SUSY     ? furosemide (LASIX) 20 MG tablet Take 1 tablet by mouth once daily 90 tablet 3  ? levothyroxine (SYNTHROID) 50 MCG tablet Take 50 mcg by mouth daily before breakfast.    ? lisinopril (ZESTRIL) 2.5 MG tablet Take 2.5 mg by mouth daily.    ? MAGNESIUM CITRATE PO Take 400 mg by mouth in the morning, at noon, and at bedtime.    ? metoprolol tartrate (LOPRESSOR) 25 MG tablet Take 25 mg by mouth 2 (two) times daily. Pt takes a total of 75 mg daily    ? mupirocin ointment (BACTROBAN) 2 % Apply 1 application topically 3 (three) times daily.    ? nitroGLYCERIN (NITROSTAT) 0.4 MG SL tablet Place 0.4 mg under the tongue every 5 (five) minutes as needed for chest pain.    ? ondansetron (ZOFRAN ODT) 4 MG disintegrating tablet Take 1 tablet (4 mg total) by mouth every 8 (eight) hours as needed for nausea or vomiting. 20 tablet 0  ? pantoprazole (PROTONIX) 40 MG tablet Take 40 mg by mouth daily.    ? predniSONE (DELTASONE) 10 MG tablet Take by mouth.    ? promethazine (PHENERGAN) 12.5 MG tablet Take 12.5 mg  by mouth 4 (four) times daily as needed for nausea/vomiting.    ? Red Yeast Rice 600 MG CAPS Take 600 mg by mouth in the morning and at bedtime.    ? rosuvastatin (CRESTOR) 5 MG tablet Take 5 mg by mouth daily.    ? spironolactone (ALDACTONE) 25 MG tablet Take 0.5 tablets (12.5 mg total) by mouth daily. 30 tablet 0  ? Terbinafine HCl (LAMISIL AT SPRAY) 1 % SOLN Spray in between toes once daily at bedtime 125 mL 0  ? traMADol (ULTRAM) 50 MG tablet Take 50 mg by mouth every 6 (six) hours as needed for severe pain.    ? ?No current facility-administered medications on file prior to visit.  ? ? ?Allergies   ?Allergen Reactions  ? Iodinated Contrast Media Rash  ? Ticagrelor Rash  ? Statins Other (See Comments)  ?  myalgias ? ?Other reaction(s): Other (see comments) ?myalgias ?myalgias  ? ? ?Objective:  ?General: Alert and oriented x3 in no acute distress ? ?Dermatology:  There is granular wound at right hallux distal tuft that measures 0.5 x 0.5 cm once debrided there was some mild active bloody drainage, with bone exposed so using a tissue nipper remove the exposed bone segment, no erythema, no edema.  Preulcerative callus to the plantar right heel.  Previous left second toe wound remains healed. ? ?Nails x10 are short and thickened consistent with onychomycosis. ? ?Vascular: Dorsalis Pedis and Posterior Tibial pedal pulses nonpalpable  Temperature gradient decreased bilateral.  Purple discoloration to toes unchanged from prior. ? ?Neurology: Gross sensation intact via light touch bilateral. ? ?Musculoskeletal: No reproducible tenderness to palpation bilateral.  There is significant bunion and hammertoe deformity noted bilateral.  Pes planus deformity noted. ? ? ?Assessment and Plan: ?Problem List Items Addressed This Visit   ?None ?Visit Diagnoses   ? ? 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 Eschar of toe      ? Resolved  ? Pre-ulcerative calluses      ? PAD (peripheral artery disease) ([place])      ? Toe pain, right      ? ?  ? ? ?-Complete examination performed ?-Mechanically debrided any loose skin using a sterile 15 blade and exposed bone visit loose tissue nipper, patient tolerated debridement without pain or discomfort at the right hallux distal tuft there was some mild bleeding that was treated with silver nitrate and dressed with gauze and loose sock and advised patient to have daughter to apply silver nitrate twice weekly ?-Written orders provided to daughter to do wound care as above ?-Continue with shoes that do not rub toes and heel cushions as provided at this  visit ?-Return in 3 to 4 weeks for follow-up wound check or sooner problems or issues arise. ? ?Landis Martins, DPM ? ?

## 2021-07-07 DIAGNOSIS — I509 Heart failure, unspecified: Secondary | ICD-10-CM | POA: Diagnosis not present

## 2021-07-14 ENCOUNTER — Encounter: Payer: Self-pay | Admitting: Podiatry

## 2021-07-14 ENCOUNTER — Ambulatory Visit: Payer: Medicare Other | Admitting: Podiatry

## 2021-07-14 DIAGNOSIS — L84 Corns and callosities: Secondary | ICD-10-CM

## 2021-07-14 DIAGNOSIS — I739 Peripheral vascular disease, unspecified: Secondary | ICD-10-CM

## 2021-07-14 DIAGNOSIS — L97514 Non-pressure chronic ulcer of other part of right foot with necrosis of bone: Secondary | ICD-10-CM

## 2021-07-14 NOTE — Patient Instructions (Signed)
Betadine or neosporin and bandaid to tip of right great toe.

## 2021-07-14 NOTE — Progress Notes (Signed)
Subjective: Olivia [name] is a 88 y.o. female patient who returns to the office for follow-up wound care.  Patient reports that toe still sore and daughter has been applying the silver nitrate as I have directed to the tip of the toe which seems to be helping.  Patient Active Problem List   Diagnosis Date Noted   Breast cancer [facility])    CAD (coronary artery disease)    Chronic systolic heart failure ([place])    Hyperlipidemia with target LDL less than 70    Hypertension    Malignant neoplasm of upper-outer quadrant of right female breast ([place])    PVD (peripheral vascular disease) ([place])    Senile osteoporosis    Dyspnea    Protein-calorie malnutrition, severe 11/23/2019   Elevated troponin    Age-related osteoporosis without current pathological fracture 10/29/2019   Atypical ductal hyperplasia of left breast 10/12/2018    Class: Diagnosis of   Long-term use of aspirin therapy 07/20/2017   Mixed hyperlipidemia 05/28/2017   Old MI (myocardial infarction) 05/28/2017   Palpitations 05/28/2017   CHF (congestive heart failure), NYHA class III, acute, systolic ([place]) 96/29/5284   Ischemic dilated cardiomyopathy ([place]) 03/15/2017   Nonrheumatic aortic valve insufficiency 03/15/2017   Non-rheumatic mitral regurgitation 03/15/2017   S/P CABG (coronary artery bypass graft) 03/15/2017   Statin intolerance 03/15/2017   Dysphagia as late effect of cerebrovascular accident (CVA) 04/02/2016   Drug-induced skin rash 13/24/4010   Embolic stroke involving right middle cerebral artery ([place]) 03/25/2016   Atherosclerotic heart disease of native coronary artery without angina pectoris 10/02/2014   Essential (primary) hypertension 10/02/2014   Other specified postprocedural states 10/02/2014   History of mitral valve repair 2008   Hx of CABG 2008    Current Outpatient Medications on File Prior to Visit  Medication Sig Dispense Refill   aspirin EC 81 MG EC tablet Take 1 tablet (81 mg total) by mouth  daily. Swallow whole. 30 tablet 11   clopidogrel (PLAVIX) 75 MG tablet Take 75 mg by mouth daily.     clotrimazole-betamethasone (LOTRISONE) cream APPLY TO RASH ON FEET TWICE DAILY. 30 g 0   fluticasone (FLONASE) 50 MCG/ACT nasal spray Place 2 sprays into both nostrils daily.     FLUZONE HIGH-DOSE QUADRIVALENT 0.7 ML SUSY      furosemide (LASIX) 20 MG tablet Take 1 tablet by mouth once daily 90 tablet 3   levothyroxine (SYNTHROID) 50 MCG tablet Take 50 mcg by mouth daily before breakfast.     lisinopril (ZESTRIL) 2.5 MG tablet Take 2.5 mg by mouth daily.     MAGNESIUM CITRATE PO Take 400 mg by mouth in the morning, at noon, and at bedtime.     metoprolol tartrate (LOPRESSOR) 25 MG tablet Take 25 mg by mouth 2 (two) times daily. Pt takes a total of 75 mg daily     mupirocin ointment (BACTROBAN) 2 % Apply 1 application topically 3 (three) times daily.     nitroGLYCERIN (NITROSTAT) 0.4 MG SL tablet Place 0.4 mg under the tongue every 5 (five) minutes as needed for chest pain.     ondansetron (ZOFRAN ODT) 4 MG disintegrating tablet Take 1 tablet (4 mg total) by mouth every 8 (eight) hours as needed for nausea or vomiting. 20 tablet 0   pantoprazole (PROTONIX) 40 MG tablet Take 40 mg by mouth daily.     predniSONE (DELTASONE) 10 MG tablet Take by mouth.     promethazine (PHENERGAN) 12.5 MG tablet Take 12.5 mg  by mouth 4 (four) times daily as needed for nausea/vomiting.     Red Yeast Rice 600 MG CAPS Take 600 mg by mouth in the morning and at bedtime.     rosuvastatin (CRESTOR) 5 MG tablet Take 5 mg by mouth daily.     spironolactone (ALDACTONE) 25 MG tablet Take 0.5 tablets (12.5 mg total) by mouth daily. 30 tablet 0   Terbinafine HCl (LAMISIL AT SPRAY) 1 % SOLN Spray in between toes once daily at bedtime 125 mL 0   traMADol (ULTRAM) 50 MG tablet Take 50 mg by mouth every 6 (six) hours as needed for severe pain.     No current facility-administered medications on file prior to visit.    Allergies   Allergen Reactions   Iodinated Contrast Media Rash   Ticagrelor Rash   Statins Other (See Comments)    myalgias  Other reaction(s): Other (see comments) myalgias myalgias    Objective:  General: Alert and oriented x3 in no acute distress  Dermatology:  Distal hallux wound healed overylying callus present.  no erythema, no edema.  Preulcerative callus to the plantar right heel.  Previous left second toe wound remains healed.  Nails x10 are short and thickened consistent with onychomycosis.  Vascular: Dorsalis Pedis and Posterior Tibial pedal pulses nonpalpable  Temperature gradient decreased bilateral.  Purple discoloration to toes unchanged from prior.  Neurology: Johney [place] sensation intact via light touch bilateral.  Musculoskeletal: No reproducible tenderness to palpation bilateral.  There is significant bunion and hammertoe deformity noted bilateral.  Pes planus deformity noted.   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  -Written orders provided to daughter to do wound care as above -Continue with shoes that do not rub toes and heel cushions as provided at this visit -Return in 4 weeks for follow-up wound check or sooner problems or issues arise.  Lorenda Peck, DPM

## 2021-07-22 DIAGNOSIS — C50911 Malignant neoplasm of unspecified site of right female breast: Secondary | ICD-10-CM | POA: Diagnosis not present

## 2021-07-22 DIAGNOSIS — C792 Secondary malignant neoplasm of skin: Secondary | ICD-10-CM | POA: Diagnosis not present

## 2021-07-23 DIAGNOSIS — C50411 Malignant neoplasm of upper-outer quadrant of right female breast: Secondary | ICD-10-CM | POA: Diagnosis not present

## 2021-07-23 DIAGNOSIS — C792 Secondary malignant neoplasm of skin: Secondary | ICD-10-CM | POA: Diagnosis not present

## 2021-08-01 DIAGNOSIS — C50911 Malignant neoplasm of unspecified site of right female breast: Secondary | ICD-10-CM | POA: Diagnosis not present

## 2021-08-06 DIAGNOSIS — I509 Heart failure, unspecified: Secondary | ICD-10-CM | POA: Diagnosis not present

## 2021-08-11 ENCOUNTER — Encounter: Payer: Self-pay | Admitting: Podiatry

## 2021-08-11 ENCOUNTER — Ambulatory Visit: Payer: Medicare Other | Admitting: Podiatry

## 2021-08-11 DIAGNOSIS — E785 Hyperlipidemia, unspecified: Secondary | ICD-10-CM | POA: Diagnosis not present

## 2021-08-11 DIAGNOSIS — I2510 Atherosclerotic heart disease of native coronary artery without angina pectoris: Secondary | ICD-10-CM | POA: Diagnosis not present

## 2021-08-11 DIAGNOSIS — Z955 Presence of coronary angioplasty implant and graft: Secondary | ICD-10-CM | POA: Diagnosis not present

## 2021-08-11 DIAGNOSIS — D62 Acute posthemorrhagic anemia: Secondary | ICD-10-CM | POA: Diagnosis not present

## 2021-08-11 DIAGNOSIS — I480 Paroxysmal atrial fibrillation: Secondary | ICD-10-CM | POA: Diagnosis not present

## 2021-08-11 DIAGNOSIS — I5022 Chronic systolic (congestive) heart failure: Secondary | ICD-10-CM | POA: Diagnosis not present

## 2021-08-11 DIAGNOSIS — E039 Hypothyroidism, unspecified: Secondary | ICD-10-CM | POA: Diagnosis not present

## 2021-08-11 DIAGNOSIS — I739 Peripheral vascular disease, unspecified: Secondary | ICD-10-CM

## 2021-08-11 DIAGNOSIS — L97514 Non-pressure chronic ulcer of other part of right foot with necrosis of bone: Secondary | ICD-10-CM | POA: Diagnosis not present

## 2021-08-11 DIAGNOSIS — I63411 Cerebral infarction due to embolism of right middle cerebral artery: Secondary | ICD-10-CM | POA: Diagnosis not present

## 2021-08-11 NOTE — Progress Notes (Signed)
Subjective: Olivia [name] is a 88 y.o. female patient who returns to the office for follow-up wound care.  Patient reports that toe still sore and daughter has been applying the silver nitrate .  Patient Active Problem List   Diagnosis Date Noted   Breast cancer [facility])    CAD (coronary artery disease)    Chronic systolic heart failure ([place])    Hyperlipidemia with target LDL less than 70    Hypertension    Malignant neoplasm of upper-outer quadrant of right female breast ([place])    PVD (peripheral vascular disease) ([place])    Senile osteoporosis    Dyspnea    Protein-calorie malnutrition, severe 11/23/2019   Elevated troponin    Age-related osteoporosis without current pathological fracture 10/29/2019   Atypical ductal hyperplasia of left breast 10/12/2018    Class: Diagnosis of   Long-term use of aspirin therapy 07/20/2017   Mixed hyperlipidemia 05/28/2017   Old MI (myocardial infarction) 05/28/2017   Palpitations 05/28/2017   CHF (congestive heart failure), NYHA class III, acute, systolic ([place]) 60/45/4098   Ischemic dilated cardiomyopathy ([place]) 03/15/2017   Nonrheumatic aortic valve insufficiency 03/15/2017   Non-rheumatic mitral regurgitation 03/15/2017   S/P CABG (coronary artery bypass graft) 03/15/2017   Statin intolerance 03/15/2017   Dysphagia as late effect of cerebrovascular accident (CVA) 04/02/2016   Drug-induced skin rash 11/91/4782   Embolic stroke involving right middle cerebral artery ([place]) 03/25/2016   Atherosclerotic heart disease of native coronary artery without angina pectoris 10/02/2014   Essential (primary) hypertension 10/02/2014   Other specified postprocedural states 10/02/2014   History of mitral valve repair 2008   Hx of CABG 2008    Current Outpatient Medications on File Prior to Visit  Medication Sig Dispense Refill   aspirin EC 81 MG EC tablet Take 1 tablet (81 mg total) by mouth daily. Swallow whole. 30 tablet 11   clopidogrel (PLAVIX) 75 MG  tablet Take 75 mg by mouth daily.     clotrimazole-betamethasone (LOTRISONE) cream APPLY TO RASH ON FEET TWICE DAILY. 30 g 0   fluticasone (FLONASE) 50 MCG/ACT nasal spray Place 2 sprays into both nostrils daily.     FLUZONE HIGH-DOSE QUADRIVALENT 0.7 ML SUSY      furosemide (LASIX) 20 MG tablet Take 1 tablet by mouth once daily 90 tablet 3   levothyroxine (SYNTHROID) 50 MCG tablet Take 50 mcg by mouth daily before breakfast.     lisinopril (ZESTRIL) 2.5 MG tablet Take 2.5 mg by mouth daily.     MAGNESIUM CITRATE PO Take 400 mg by mouth in the morning, at noon, and at bedtime.     metoprolol tartrate (LOPRESSOR) 25 MG tablet Take 25 mg by mouth 2 (two) times daily. Pt takes a total of 75 mg daily     mupirocin ointment (BACTROBAN) 2 % Apply 1 application topically 3 (three) times daily.     nitroGLYCERIN (NITROSTAT) 0.4 MG SL tablet Place 0.4 mg under the tongue every 5 (five) minutes as needed for chest pain.     ondansetron (ZOFRAN ODT) 4 MG disintegrating tablet Take 1 tablet (4 mg total) by mouth every 8 (eight) hours as needed for nausea or vomiting. 20 tablet 0   pantoprazole (PROTONIX) 40 MG tablet Take 40 mg by mouth daily.     predniSONE (DELTASONE) 10 MG tablet Take by mouth.     promethazine (PHENERGAN) 12.5 MG tablet Take 12.5 mg by mouth 4 (four) times daily as needed for nausea/vomiting.  Red Yeast Rice 600 MG CAPS Take 600 mg by mouth in the morning and at bedtime.     rosuvastatin (CRESTOR) 5 MG tablet Take 5 mg by mouth daily.     spironolactone (ALDACTONE) 25 MG tablet Take 0.5 tablets (12.5 mg total) by mouth daily. 30 tablet 0   Terbinafine HCl (LAMISIL AT SPRAY) 1 % SOLN Spray in between toes once daily at bedtime 125 mL 0   traMADol (ULTRAM) 50 MG tablet Take 50 mg by mouth every 6 (six) hours as needed for severe pain.     No current facility-administered medications on file prior to visit.    Allergies  Allergen Reactions   Iodinated Contrast Media Rash    Ticagrelor Rash   Statins Other (See Comments)    myalgias  Other reaction(s): Other (see comments) myalgias myalgias    Objective:  General: Alert and oriented x3 in no acute distress  Dermatology:  Distal hallux wound with small area 0.1 cm distally  no erythema, no edema.  Preulcerative callus to the plantar right heel.  Previous left second toe wound remains healed.  Nails x10 are short and thickened consistent with onychomycosis.  Vascular: Dorsalis Pedis and Posterior Tibial pedal pulses nonpalpable  Temperature gradient decreased bilateral.  Purple discoloration to toes unchanged from prior.  Neurology: Johney Maine sensation intact via light touch bilateral.  Musculoskeletal: No reproducible tenderness to palpation bilateral.  There is significant bunion and hammertoe deformity noted bilateral.  Pes planus deformity noted.   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  -Written orders provided to daughter to do wound care as above, betdine and bandaid.  -Continue with shoes that do not rub toes and heel cushions as provided at this visit -Return in 4 weeks for follow-up wound check or sooner problems or issues arise.   Procedure: Excisional Debridement of Wound Rationale: Removal of non-viable soft tissue from the wound to promote healing.  Anesthesia: none Pre-Debridement Wound Measurements: Overlying callus  Post-Debridement Wound Measurements: 0.1 cm x 0.1 cm x 0.1 cm  Type of Debridement: Sharp Excisional Tissue Removed: Non-viable soft  tissue Depth of Debridement: subcutaneous tissue. Technique: Sharp excisional debridement to bleeding, viable wound base.  Dressing: Dry, sterile, compression dressing. Disposition: Patient tolerated procedure well. Patient to return in 4 week for follow-up.  Return in about 4 weeks (around 09/08/2021) for wound check.   Lorenda Peck, DPM

## 2021-08-15 ENCOUNTER — Inpatient Hospital Stay: Payer: Medicare Other | Attending: Oncology | Admitting: Oncology

## 2021-08-15 ENCOUNTER — Other Ambulatory Visit: Payer: Self-pay | Admitting: Oncology

## 2021-08-15 ENCOUNTER — Inpatient Hospital Stay: Payer: Medicare Other

## 2021-08-15 DIAGNOSIS — M810 Age-related osteoporosis without current pathological fracture: Secondary | ICD-10-CM | POA: Diagnosis not present

## 2021-08-15 DIAGNOSIS — D539 Nutritional anemia, unspecified: Secondary | ICD-10-CM

## 2021-08-15 DIAGNOSIS — C50411 Malignant neoplasm of upper-outer quadrant of right female breast: Secondary | ICD-10-CM | POA: Diagnosis not present

## 2021-08-15 DIAGNOSIS — Z170 Estrogen receptor positive status [ER+]: Secondary | ICD-10-CM

## 2021-08-15 DIAGNOSIS — I10 Essential (primary) hypertension: Secondary | ICD-10-CM | POA: Diagnosis not present

## 2021-08-15 DIAGNOSIS — C792 Secondary malignant neoplasm of skin: Secondary | ICD-10-CM

## 2021-08-15 LAB — BASIC METABOLIC PANEL
BUN: 33 — AB (ref 4–21)
CO2: 26 — AB (ref 13–22)
Chloride: 102 (ref 99–108)
Creatinine: 1.1 (ref 0.5–1.1)
Glucose: 80
Potassium: 4.3 mEq/L (ref 3.5–5.1)
Sodium: 140 (ref 137–147)

## 2021-08-15 LAB — COMPREHENSIVE METABOLIC PANEL
Albumin: 4.2 (ref 3.5–5.0)
Calcium: 9.4 (ref 8.7–10.7)

## 2021-08-15 LAB — CBC AND DIFFERENTIAL
HCT: 35 — AB (ref 36–46)
Hemoglobin: 11.1 — AB (ref 12.0–16.0)
Neutrophils Absolute: 5.06
Platelets: 263 10*3/uL (ref 150–400)
WBC: 7.9

## 2021-08-15 LAB — HEPATIC FUNCTION PANEL
ALT: 21 U/L (ref 7–35)
AST: 23 (ref 13–35)
Alkaline Phosphatase: 46 (ref 25–125)
Bilirubin, Total: 0.5

## 2021-08-15 LAB — CBC: RBC: 3.77 — AB (ref 3.87–5.11)

## 2021-08-15 MED ORDER — LETROZOLE 2.5 MG PO TABS: 2.5000 mg | ORAL_TABLET | Freq: Every day | ORAL | 5 refills | Status: DC

## 2021-08-15 NOTE — Progress Notes (Signed)
[facility]  [address] [place],  [place]  [address] [phone]  Clinic Day:  08/15/21  Referring physician: Nicoletta Dress, MD  CHIEF COMPLAINT:  CC:  Stage II B hormone receptor positive breast cancer and age-related osteoporosis  Current Treatment:    Prolia every 6 months   HISTORY OF PRESENT ILLNESS:  Olivia [name] is a 88 y.o. 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  She was scheduled for a right partial mastectomy in February 2018, but had an acute myocardial infarction and stroke.  She required placement of 2 coronary artery stents.  The surgery was delayed until August.  Final pathology revealed multifocal invasive ductal carcinoma with a 2.4 cm lesion and a 1.2 cm lesion, both grade 2 with clear margins, as well as intermediate grade ductal carcinoma in situ.  One of 2 nodes was positive with extracapsular extension and lymphovascular invasion.  Estrogen and progesterone receptors were positive and her 2 Neu was negative.  Ki 67 was 10%.  Due to her other medical comorbidities, we did not feel she was a candidate for aggressive chemotherapy.  She was placed on anastrozole 1 mg daily in September 2018.  Bone density scan in September 2018 revealed osteoporosis with a statistically significant decrease in the bone mineral density compared to the prior study of 2005. Her T-score of the right femur was -3.6, which was 6.8% worse, with a T-score of -3.2 of the right forearm.  She was already taking calcium 600 mg twice daily.  Due to the risk of worsening osteopenia with anastrozole, she was also placed on alendronate 70 mg weekly.  She went to the hospital in early 2020 twice for bleeding ulcers. She had four blood transfusions.  She was due for mammography in May, but this was delayed due to COVID-19.  Bilateral diagnostic mammogram in August of 2020 revealed a 3 cm area of  pleomorphic calcifications in the left breast.  There was no evidence of malignancy in the right breast.  Biopsy of the left breast lesion revealed focal atypical ductal hyperplasia with calcifications, with no in situ or invasive malignancy identified.  She underwent left lumpectomy in September.  Final pathology revealed atypical ductal hyperplasia and florid usual ductal hyperplasia with calcifications.  No in situ or invasive malignancy was identified.  Margins were clear.  Bone density from September 2020 revealed worsening osteoporosis with a T-score of -3.6 of the left femur, previously -3.4.  The right forearm measured -4.3, previously -3.2.  We therefore stopped the anastrozole after 2 years and switched to raloxifene for chemoprevention, which can also also treat her osteoporosis.  She was  placed on Prolia injections every 6 months for osteoporosis.  Due to severe arthralgias affecting her quality of life, we discontinued raloxifene in March 2021. She presented to the emergency department in mid September, and then again on October 2nd due to acute [name] injury.  CT urogram from October revealed a 7 mm obstructing proximal right ureteral calculus at the L3-4 level, with  moderate right hydronephrosis, and a 16 mm calculus in the left renal pelvis. There were additional nonobstructing bilateral renal calculi, and a 17 mm hyperdense lesion in the anterior right upper [name], possibly reflecting a hemorrhagic cyst.  She saw Dr. Nila Nephew he discontinued calcium and vitamin D.  She was scheduled for lithotripsy.    Annual bilateral mammogram and ultrasound from August 2022 revealed an  interval 1.7 x 1.5 x 0.4 cm irregular mass with pleomorphic calcifications in the skin and underlying breast in the 10 o'clock position of the right breast.  No evidence of malignancy elsewhere in either breast.  Excisional biopsy was pursued on August 15th and surgical pathology confirmed invasive ductal carcinoma, grade 2, and  high grade DCIS with comedo necrosis and calcifications.  Tumor measures 1.5 cm and is invasive within dermis and subcutaneous tissue.  Skin ulceration, lymphovascular invasion and perineural invasion identified.  Margins free of neoplasm.  This stages her as a stage IA (T1c N0 M0).  HER2 was equivocal 2+, but negative by FISH.  Estrogen receptor was positive at 100% and progesterone receptor was positive at 5%.  Ki67 was 15%.   INTERVAL HISTORY:  Olivia [name] is here for routine follow up and states that she has been well. While in the waiting room, Olivia [name] had a fall but reports that she is okay and denies any pain. She developed several tiny nodules at the right lateral breast incision.  Dr.Morgan performed a punch biopsy and sent over those results. Results showed intradermal carcinoma consistent with breast primary.Estrogen receptors were 100% progesterone receptors were 0%, KI67 was 10%. HER2 was negative. In the ppast we have avoided hormonal therapy because of her significant osteoporosis. However she has been on Prolia treatment for nearly 2 years so I feel it is safe to proceed with an aromatase inhibitor. It is now necessary for recurrent disease. She has been on anastrazole and raloxifene in the past. She will now be on letrozole once a day. Her appetite is good, and her weight is stable. She denies fever, chills or other signs of infection.  She denies nausea, vomiting, bowel issues, or abdominal pain.  She denies sore throat, cough, dyspnea, or chest pain. We will continue Prolia injections. CBC shows her hemoglobin is stable at 11.1. CMP is normal other than a creatinine of 1.1, improved from 1.2 in April.Her daughter is her with her today as usual.    REVIEW OF SYSTEMS:  Review of Systems  Constitutional: Negative.  Negative for appetite change, chills, fatigue, fever and unexpected weight change.  HENT:  Negative.    Eyes: Negative.   Respiratory: Negative.  Negative for chest tightness, cough,  hemoptysis, shortness of breath and wheezing.   Cardiovascular: Negative.  Negative for chest pain, leg swelling and palpitations.  Gastrointestinal: Negative.  Negative for abdominal distention, abdominal pain, blood in stool, constipation, diarrhea, nausea and vomiting.  Endocrine: Negative.   Genitourinary: Negative.  Negative for difficulty urinating, dysuria, frequency and hematuria.   Musculoskeletal: Negative.  Negative for arthralgias, back pain, flank pain, gait problem and myalgias.  Skin: Negative.   Neurological: Negative.  Negative for dizziness, extremity weakness, gait problem, headaches, light-headedness, numbness, seizures and speech difficulty.  Hematological: Negative.   Psychiatric/Behavioral: Negative.  Negative for depression and sleep disturbance. The patient is not nervous/anxious.      VITALS:  There were no vitals taken for this visit.  Wt Readings from Last 3 Encounters:  05/16/21 126 lb 1.3 oz (57.2 kg)  05/16/21 126 lb (57.2 kg)  02/14/21 126 lb 6.4 oz (57.3 kg)    There is no height or weight on file to calculate BMI.  Performance status (ECOG): 0 - Asymptomatic  PHYSICAL EXAM:  Physical Exam Constitutional:      General: She is not in acute distress.    Appearance: Normal appearance. She is normal weight.  HENT:     Head:  Normocephalic and atraumatic.  Eyes:     General: No scleral icterus.    Extraocular Movements: Extraocular movements intact.     Conjunctiva/sclera: Conjunctivae normal.     Pupils: Pupils are equal, round, and reactive to light.  Cardiovascular:     Rate and Rhythm: Normal rate and regular rhythm.     Pulses: Normal pulses.     Heart sounds: Murmur heard.     Systolic murmur is present with a grade of 1/6.     No friction rub. No gallop.  Pulmonary:     Effort: Pulmonary effort is normal. No respiratory distress.     Breath sounds: Normal breath sounds.  Chest:     Chest wall: No mass.  Breasts:    Right: No mass.      Left: No mass.     Comments: Deep well healed scar upper quadrant right breast, with several tiny nodules in that same area No masses in the breast Abdominal:     General: Bowel sounds are normal. There is no distension.     Palpations: Abdomen is soft. There is no hepatomegaly, splenomegaly or mass.     Tenderness: There is no abdominal tenderness.  Musculoskeletal:        General: Normal range of motion.     Cervical back: Normal range of motion and neck supple.     Right lower leg: No edema.     Left lower leg: No edema.  Lymphadenopathy:     Cervical: No cervical adenopathy.  Skin:    General: Skin is warm and dry.  Neurological:     General: No focal deficit present.     Mental Status: She is alert and oriented to person, place, and time. Mental status is at baseline.  Psychiatric:        Mood and Affect: Mood normal.        Behavior: Behavior normal.        Thought Content: Thought content normal.        Judgment: Judgment normal.    LABS:      Latest Ref Rng & Units 08/15/2021   12:00 AM 05/16/2021   12:00 AM 11/15/2020   12:00 AM  CBC  WBC  7.9     5.9     7.9      Hemoglobin 12.0 - 16.0 11.1     11.1     12.9      Hematocrit 36 - 46 35     35     39      Platelets 150 - 400 K/uL 263     256     274         This result is from an external source.      Latest Ref Rng & Units 08/15/2021   12:00 AM 05/16/2021   12:00 AM 11/15/2020   12:00 AM  CMP  BUN 4 - 21 33     25     29      Creatinine 0.5 - 1.1 1.1     1.2     1.5      Sodium 137 - 147 140     141     141      Potassium 3.5 - 5.1 mEq/L 4.3     3.9     3.8      Chloride 99 - 108 102     103     102  CO2 13 - _0 Calcium 8.7 - 10.7 9.4     8.8     9.3      Alkaline Phos 25 - 125 46     51     59      AST 13 - 35 23     36     32      ALT 7 - 35 U/L _1 This result is from an external source.    STUDIES:  No results found.    EXAM: 01/15/2021 DUAL  X-RAY ABSORPTIOMETRY (DXA) FOR BONE MINERAL DENSITY   TECHNIQUE:  Bone mineral density measurements are performed of the spine, hip,  and forearm, as appropriate, per International Society of Clinical  Densitometry recommendations. The pertinent regions of interest are  reported below. Non-contributory values are not reported. Images are  obtained for bone mineral density measurement and are not obtained  for diagnostic purposes.   FINDINGS:  LEFT FOREARM 1/3  Bone Mineral Density (BMD): 0.627 g/cm2  Young Adult T-Score: -2.8  Z-Score: 0.7   TOTAL FEMUR RIGHT  Bone Mineral Density (BMD): 0.566 g/cm2  Young Adult T-Score: -3.5  Z-Score: -1.0   DUAL FEMUR TOTAL MEAN  Bone Mineral Density (BMD): 0.585 g/cm2  Young Adult T-Score: -3.4  Z-Score: -0.8  Unit: [place] (analysis version 13.60)  Scan quality: The scan quality is good. Exclusions: None  ASSESSMENT: Patient's diagnostic category is OSTEOPOROSIS by WHO  Criteria.  FRACTURE RISK: INCREASED  FRAX: Patient does not meet criteria for FRAX assessment  COMPARISON: 10/26/2018  No significant interval change in BMD of the hips or LEFT forearm  since prior exam.   HISTORY:   Allergies:  Allergies  Allergen Reactions   Iodinated Contrast Media Rash   Ticagrelor Rash   Statins Other (See Comments)    myalgias  Other reaction(s): Other (see comments) myalgias myalgias    Current Medications: Current Outpatient Medications  Medication Sig Dispense Refill   pantoprazole (PROTONIX) 40 MG tablet Take by mouth.     aspirin EC 81 MG EC tablet Take 1 tablet (81 mg total) by mouth daily. Swallow whole. 30 tablet 11   clopidogrel (PLAVIX) 75 MG tablet Take 75 mg by mouth daily.     clotrimazole-betamethasone (LOTRISONE) cream APPLY TO RASH ON FEET TWICE DAILY. 30 g 0   fluticasone (FLONASE) 50 MCG/ACT nasal spray Place 2 sprays into both nostrils daily.     furosemide (LASIX) 20 MG tablet Take 1  tablet by mouth once daily 90 tablet 3   letrozole (FEMARA) 2.5 MG tablet Take 1 tablet (2.5 mg total) by mouth daily. 30 tablet 5   levothyroxine (SYNTHROID) 50 MCG tablet Take 50 mcg by mouth daily before breakfast.     lisinopril (ZESTRIL) 2.5 MG tablet Take 2.5 mg by mouth daily.     MAGNESIUM CITRATE PO Take 400 mg by mouth in the morning, at noon, and at bedtime.     metoprolol tartrate (LOPRESSOR) 25 MG tablet Take 25 mg by mouth 2 (two) times daily. Pt takes a total of 75 mg daily     mupirocin ointment (BACTROBAN) 2 % Apply 1 application topically 3 (three) times daily.     nitroGLYCERIN (NITROSTAT) 0.4 MG SL tablet Place 0.4 mg under the  tongue every 5 (five) minutes as needed for chest pain.     ondansetron (ZOFRAN ODT) 4 MG disintegrating tablet Take 1 tablet (4 mg total) by mouth every 8 (eight) hours as needed for nausea or vomiting. 20 tablet 0   promethazine (PHENERGAN) 12.5 MG tablet Take 12.5 mg by mouth 4 (four) times daily as needed for nausea/vomiting.     Red Yeast Rice 600 MG CAPS Take 600 mg by mouth in the morning and at bedtime.     rosuvastatin (CRESTOR) 5 MG tablet Take 5 mg by mouth daily.     spironolactone (ALDACTONE) 25 MG tablet Take 0.5 tablets (12.5 mg total) by mouth daily. 30 tablet 0   Terbinafine HCl (LAMISIL AT SPRAY) 1 % SOLN Spray in between toes once daily at bedtime 125 mL 0   traMADol (ULTRAM) 50 MG tablet Take 50 mg by mouth every 6 (six) hours as needed for severe pain.     No current facility-administered medications for this visit.     ASSESSMENT & PLAN:   Assessment:  1. Stage IIB hormone receptor positive right breast cancer, January 2018 treated with lumpectomy and adjuvant hormonal therapy for about 3 years.    2. Atypical ductal hyperplasia of the left breast, August 2020, status post lumpectomy.  3.  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  She underwent  excisional biopsy with Dr. Lilia Pro.  HER2 was equivocal 2+, but negative by FISH.  Estrogen receptor was positive at 100% and progesterone receptor was positive at 5%.  Ki67 was 15%.  We have had several discussions regarding risks and benefits of hormonal therapy for this frail elderly woman with severe osteoporosis. We decided not to pursue adjuvant hormonal therapy. The margins were clear, but she is at high risk for recurrence due to the perineural and lymphovascular invasion. Unfortunately she now has recurrence.  3. Osteoporosis.  She is now on Prolia every 6 months and is due today. Calcium and vitamin D have been discontinued due to significant renal lithiasis.  Bone density from December 2022 remains stable.  4.  Recurrence in the skin of the right lateral breast, biopsy proven and consistent with breast primary. The estrogen receptors are positive at 100%, PR is negative, HER 2 is negative and Ki-67 is 10%. We are now forced to place her on hormonal therapy to treat this but fortunately she has been on Prolia for nearly 2 years.  Plan:    I will start her on letrozole 2.5 mg daily and discussed the risks and benefits. We will plan to see her back in 1 month for an examination. The patient and her daughter understand the plans discussed today and are in agreement with them.  She knows to contact our office if she develops concerns prior to her next appointment.    I,Gabriella Ballesteros,acting as a scribe for Derwood Kaplan, MD.,have documented all relevant documentation on the behalf of Derwood Kaplan, MD,as directed by  Derwood Kaplan, MD while in the presence of Derwood Kaplan, MD.

## 2021-08-20 IMAGING — CT CT ANGIO CHEST
2 of 7 series · 16 of 46 positions shown · IV contrast (APPLIED)
Comparison: Chest CT 12/20/2006.  Radiographs 11/22/2019.

CLINICAL DATA: Shortness of breath with elevated D-dimer levels.
Low intermediate probability for pulmonary embolism.

EXAM:
CT ANGIOGRAPHY CHEST WITH CONTRAST
TECHNIQUE: Multidetector CT imaging of the chest was performed using the
standard protocol during bolus administration of intravenous
contrast. Multiplanar CT image reconstructions and MIPs were
obtained to evaluate the vascular anatomy. The patient was
premedicated according to the 62hour steroid and Benadryl prep due
to a history of iodinated contrast allergy. There were no immediate
complications.
CONTRAST:  48mL OMNIPAQUE IOHEXOL 350 MG/ML SOLN

[Series 7: thins · axial · 0.63mm/px · z∈[+1133,+1387]mm · 13 of 409 slices shown]
[im 23/409  lung]
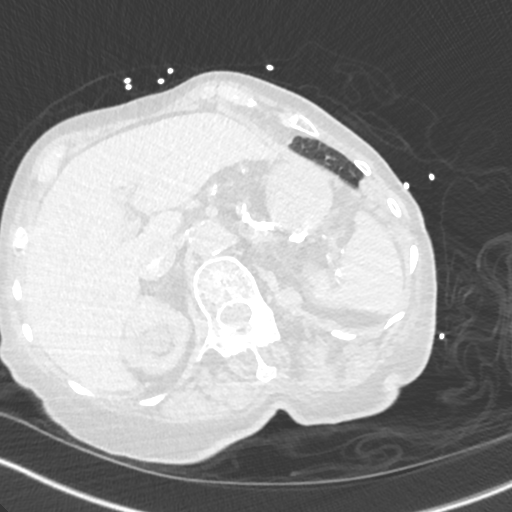
[im 46/409  soft-tissue]
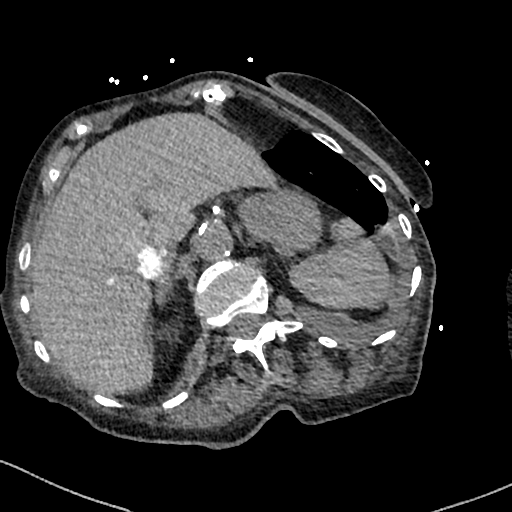
[im 91/409  lung]
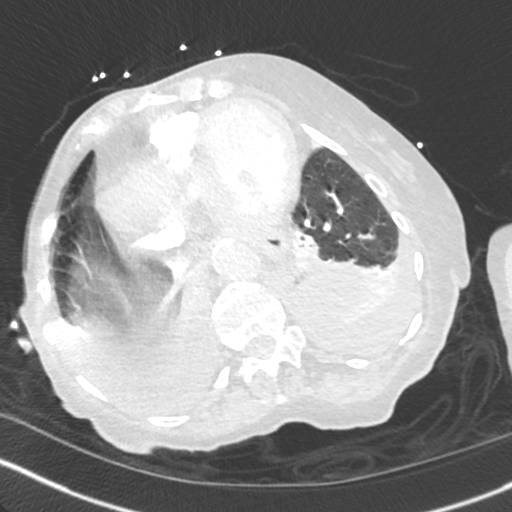
[im 114/409  soft-tissue]
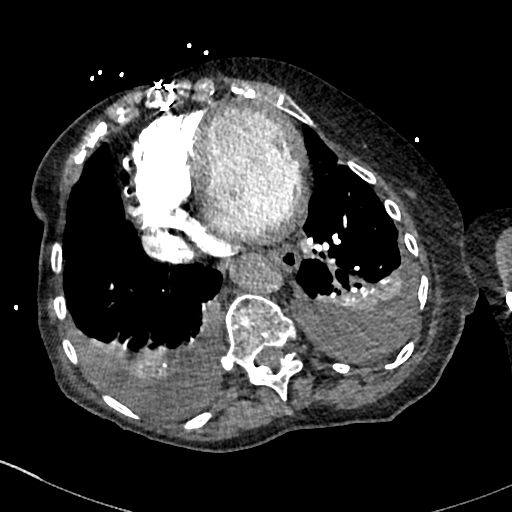
[im 137/409  lung]
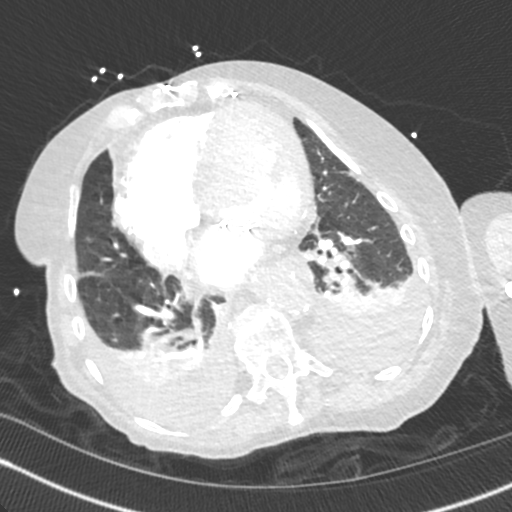
[im 182/409  soft-tissue]
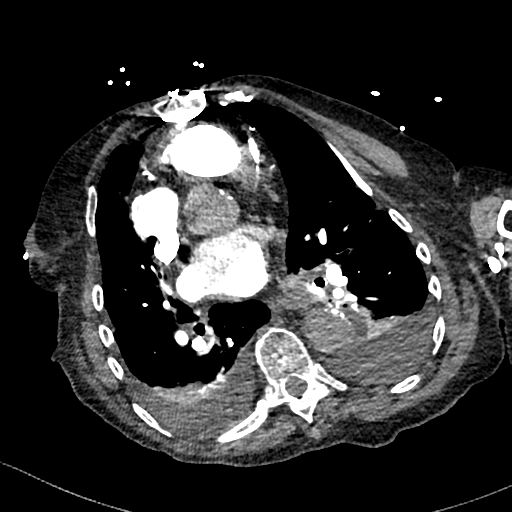
[im 205/409  lung]
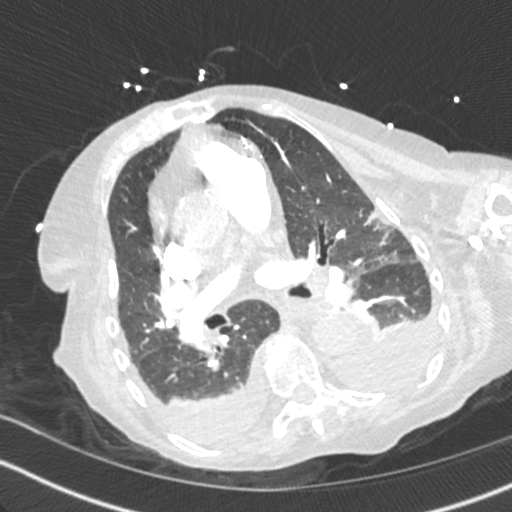
[im 227/409  soft-tissue]
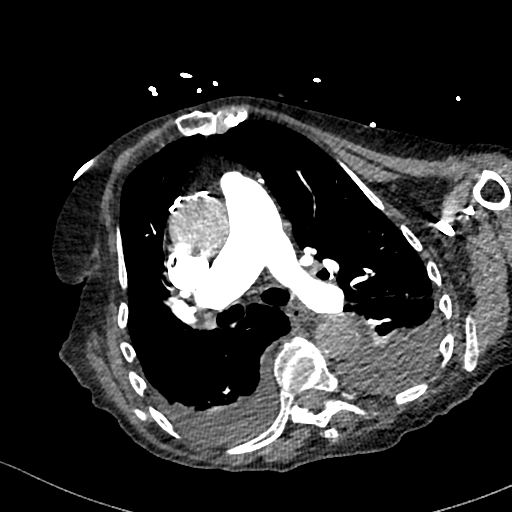
[im 273/409  lung]
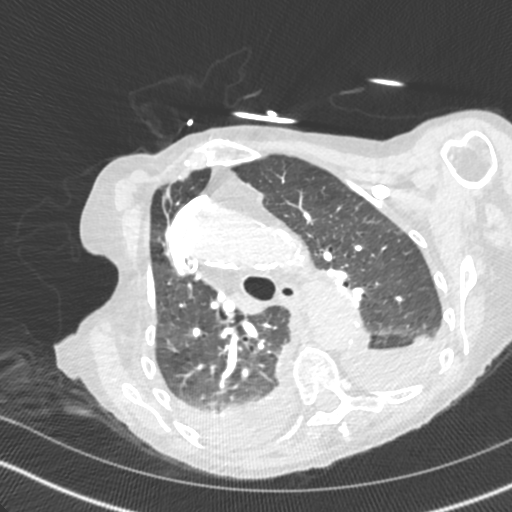
[im 295/409  soft-tissue]
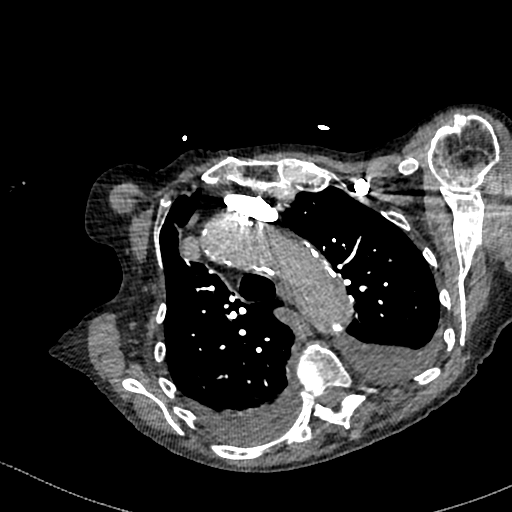
[im 318/409  lung]
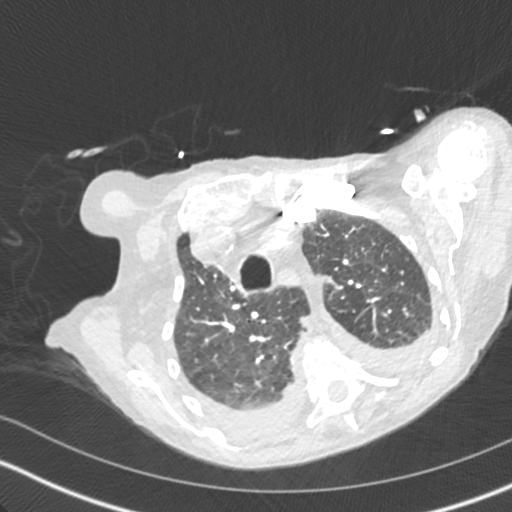
[im 363/409  soft-tissue]
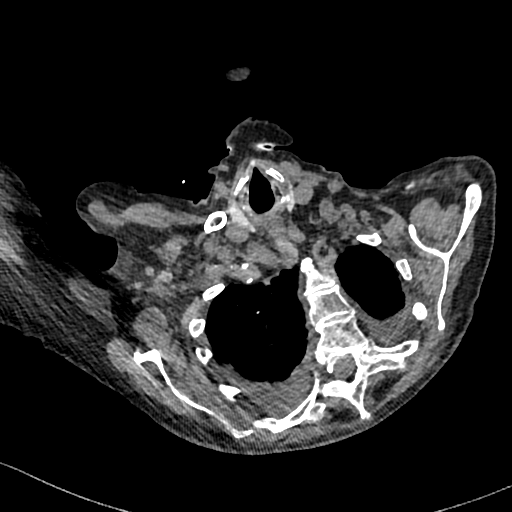
[im 386/409  lung]
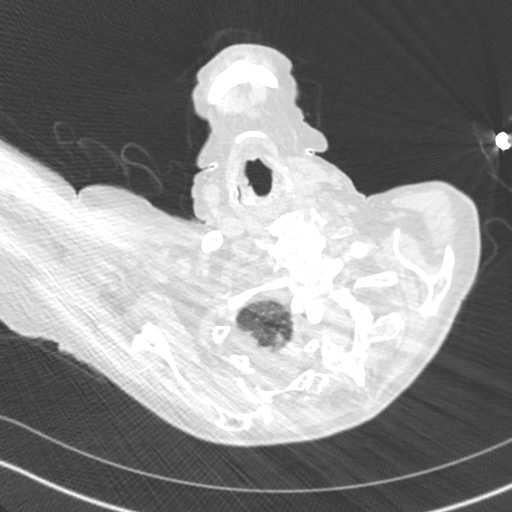

[Series 8: cor · coronal · 0.56mm/px · 3 of 141 slices shown]
[im 36/141  soft-tissue]
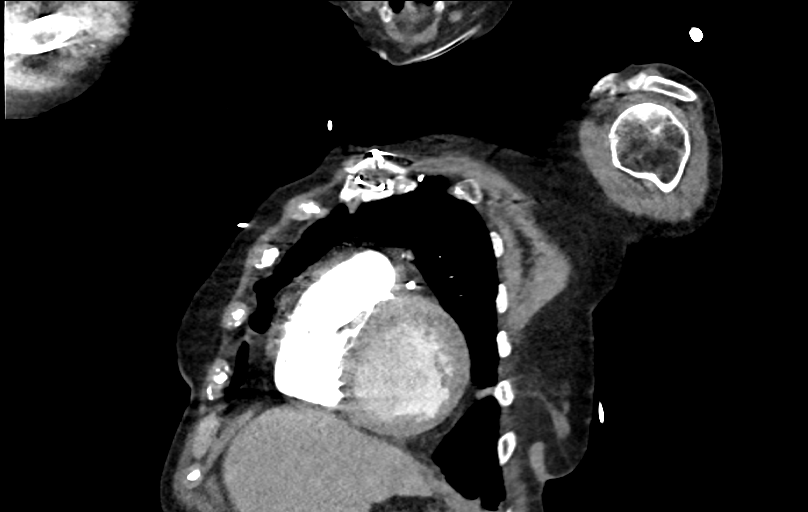
[im 71/141  soft-tissue]
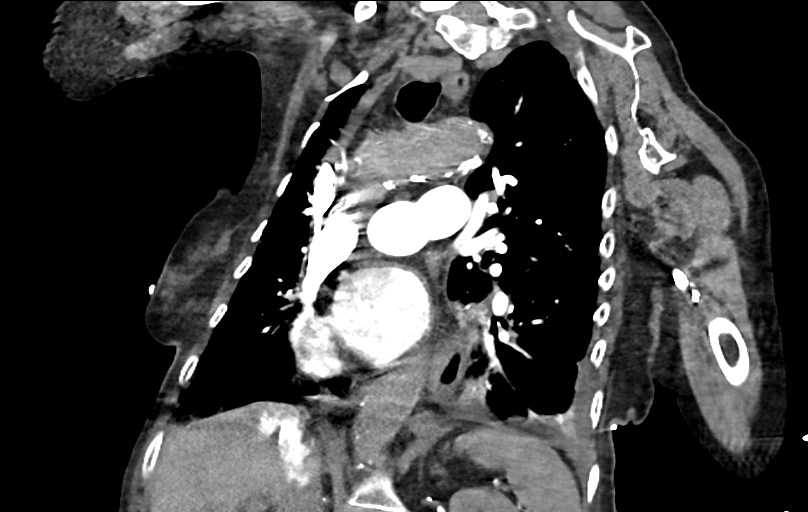
[im 106/141  soft-tissue]
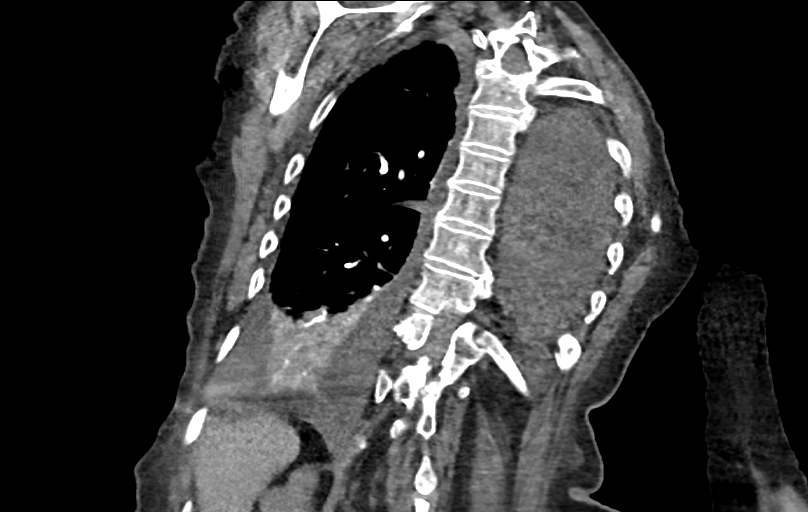

[16 of 46 positions shown; findings below may reference images not displayed]

FINDINGS: Cardiovascular: The pulmonary arteries are extremely well opacified
with contrast to the level of the subsegmental branches. There is no
evidence of acute pulmonary embolism. There is very limited
opacification of the systemic arteries. There is extensive
atherosclerosis of the aorta, great vessels and coronary arteries
post median sternotomy and CABG. There is a prosthetic mitral valve.
Aortic valvular calcifications are present. The heart is mildly
enlarged. No significant pericardial fluid.

Mediastinum/Nodes: There are no enlarged mediastinal, hilar or
axillary lymph nodes. The thyroid gland, trachea and esophagus
demonstrate no significant findings.

Lungs/Pleura: There are moderate-sized dependent pleural effusions
bilaterally with associated dependent atelectasis in both lungs.
Minimal patchy ground-glass opacities within the aerated portions of
lungs may reflect mild edema. There is no suspicious pulmonary
nodularity.

Upper abdomen: Reflux of contrast into the IVC and hepatic veins.
Right-sided hydronephrosis and a hyperdense lesion in the upper pole
of the right kidney measuring 19 mm on image 135/5 are again noted,
unchanged from abdominal CT 11/04/2019. This renal lesion is not
further characterized.

Musculoskeletal/Chest wall: There is no chest wall mass or
suspicious osseous finding. Thoracic kyphoscoliosis and chronic
deformities of the T3 and T4 vertebral bodies appear unchanged.

Review of the MIP images confirms the above findings.
IMPRESSION: 1. No evidence of acute pulmonary embolism.
2. Moderate-sized dependent pleural effusions bilaterally with
associated dependent atelectasis in both lungs. The pleural
effusions have developed over the last 3 weeks. Minimal patchy
ground-glass opacities within the aerated portions of lungs may
reflect mild edema.
3. Cardiomegaly with reflux of contrast into the IVC and hepatic
veins suggesting elevated right heart pressures.
4. Stable right-sided hydronephrosis and a hyperdense lesion in the
upper pole of the right kidney, not further characterized by this
study. See abdominal CT report and recommendations 11/04/2019.
5. Aortic Atherosclerosis ([id number]).

## 2021-09-06 DIAGNOSIS — I509 Heart failure, unspecified: Secondary | ICD-10-CM | POA: Diagnosis not present

## 2021-09-08 ENCOUNTER — Ambulatory Visit: Payer: Medicare Other | Admitting: Podiatry

## 2021-09-08 ENCOUNTER — Encounter: Payer: Self-pay | Admitting: Podiatry

## 2021-09-08 DIAGNOSIS — I739 Peripheral vascular disease, unspecified: Secondary | ICD-10-CM

## 2021-09-08 DIAGNOSIS — L84 Corns and callosities: Secondary | ICD-10-CM

## 2021-09-08 NOTE — Progress Notes (Signed)
Subjective: Olivia [name] is a 88 y.o. female patient who returns to the office for follow-up wound care.  Patient reports that toe still sore and daughter has been applying the neosporin and medihoney .  Patient Active Problem List   Diagnosis Date Noted   Deficiency anemia 05/16/2021    Class: Chronic   Breast cancer ([place])    CAD (coronary artery disease)    Chronic systolic heart failure (HCC)    Hyperlipidemia with target LDL less than 70    Hypertension    Malignant neoplasm of upper-outer quadrant of right female breast (HCC)    PVD (peripheral vascular disease) ([place])    Senile osteoporosis    Dyspnea    Protein-calorie malnutrition, severe 11/23/2019   Elevated troponin    Age-related osteoporosis without current pathological fracture 10/29/2019   Atypical ductal hyperplasia of left breast 10/12/2018    Class: Diagnosis of   Long-term use of aspirin therapy 07/20/2017   Mixed hyperlipidemia 05/28/2017   Old MI (myocardial infarction) 05/28/2017   Palpitations 05/28/2017   CHF (congestive heart failure), NYHA class III, acute, systolic ([place]) 17/51/0258   Ischemic dilated cardiomyopathy ([place]) 03/15/2017   Nonrheumatic aortic valve insufficiency 03/15/2017   Non-rheumatic mitral regurgitation 03/15/2017   S/P CABG (coronary artery bypass graft) 03/15/2017   Statin intolerance 03/15/2017   Dysphagia as late effect of cerebrovascular accident (CVA) 04/02/2016   Drug-induced skin rash 52/77/8242   Embolic stroke involving right middle cerebral artery ([place]) 03/25/2016   Atherosclerotic heart disease of native coronary artery without angina pectoris 10/02/2014   Essential (primary) hypertension 10/02/2014   Other specified postprocedural states 10/02/2014   History of mitral valve repair 2008   Hx of CABG 2008    Current Outpatient Medications on File Prior to Visit  Medication Sig Dispense Refill   aspirin EC 81 MG EC tablet Take 1 tablet (81 mg total) by mouth daily.  Swallow whole. 30 tablet 11   clopidogrel (PLAVIX) 75 MG tablet Take 75 mg by mouth daily.     clotrimazole-betamethasone (LOTRISONE) cream APPLY TO RASH ON FEET TWICE DAILY. 30 g 0   fluticasone (FLONASE) 50 MCG/ACT nasal spray Place 2 sprays into both nostrils daily.     furosemide (LASIX) 20 MG tablet Take 1 tablet by mouth once daily 90 tablet 3   letrozole (FEMARA) 2.5 MG tablet Take 1 tablet (2.5 mg total) by mouth daily. 30 tablet 5   levothyroxine (SYNTHROID) 50 MCG tablet Take 50 mcg by mouth daily before breakfast.     lisinopril (ZESTRIL) 2.5 MG tablet Take 2.5 mg by mouth daily.     MAGNESIUM CITRATE PO Take 400 mg by mouth in the morning, at noon, and at bedtime.     metoprolol tartrate (LOPRESSOR) 25 MG tablet Take 25 mg by mouth 2 (two) times daily. Pt takes a total of 75 mg daily     mupirocin ointment (BACTROBAN) 2 % Apply 1 application topically 3 (three) times daily.     nitroGLYCERIN (NITROSTAT) 0.4 MG SL tablet Place 0.4 mg under the tongue every 5 (five) minutes as needed for chest pain.     ondansetron (ZOFRAN ODT) 4 MG disintegrating tablet Take 1 tablet (4 mg total) by mouth every 8 (eight) hours as needed for nausea or vomiting. 20 tablet 0   pantoprazole (PROTONIX) 40 MG tablet Take by mouth.     promethazine (PHENERGAN) 12.5 MG tablet Take 12.5 mg by mouth 4 (four) times daily as needed for nausea/vomiting.  Red Yeast Rice 600 MG CAPS Take 600 mg by mouth in the morning and at bedtime.     rosuvastatin (CRESTOR) 5 MG tablet Take 5 mg by mouth daily.     spironolactone (ALDACTONE) 25 MG tablet Take 0.5 tablets (12.5 mg total) by mouth daily. 30 tablet 0   Terbinafine HCl (LAMISIL AT SPRAY) 1 % SOLN Spray in between toes once daily at bedtime 125 mL 0   traMADol (ULTRAM) 50 MG tablet Take 50 mg by mouth every 6 (six) hours as needed for severe pain.     No current facility-administered medications on file prior to visit.    Allergies  Allergen Reactions    Iodinated Contrast Media Rash   Ticagrelor Rash   Statins Other (See Comments)    myalgias  Other reaction(s): Other (see comments) myalgias myalgias    Objective:  General: Alert and oriented x3 in no acute distress  Dermatology:  Distal hallux wound healed. .  Preulcerative callus to the plantar right heel.  Previous left second toe wound remains healed.  Nails x10 are short and thickened consistent with onychomycosis.  Vascular: Dorsalis Pedis and Posterior Tibial pedal pulses nonpalpable  Temperature gradient decreased bilateral.  Purple discoloration to toes unchanged from prior.  Neurology: Johney Maine sensation intact via light touch bilateral.  Musculoskeletal: No reproducible tenderness to palpation bilateral.  There is significant bunion and hammertoe deformity noted bilateral.  Pes planus deformity noted.   Assessment and Plan: Problem List Items Addressed This Visit   None    -Complete examination performed -Mechanically debrided any loose skin using a sterile 15 blade , patient tolerated debridement without pain or discomfort at the right hallux distal tuft underlying ulcer  healed.  -Written orders provided to daughter to keep bandaid and protective covering over the areas.  -Continue with shoes that do not rub toes and heel cushions as provided at this visit -Return in 4 weeks for follow-up wound check or sooner problems or issues arise.    No follow-ups on file.   Lorenda Peck, DPM

## 2021-09-11 DIAGNOSIS — Z853 Personal history of malignant neoplasm of breast: Secondary | ICD-10-CM | POA: Diagnosis not present

## 2021-09-11 DIAGNOSIS — N6311 Unspecified lump in the right breast, upper outer quadrant: Secondary | ICD-10-CM | POA: Diagnosis not present

## 2021-09-15 ENCOUNTER — Ambulatory Visit: Payer: Medicare Other | Admitting: Oncology

## 2021-09-15 ENCOUNTER — Encounter: Payer: Self-pay | Admitting: Oncology

## 2021-09-16 DIAGNOSIS — C50911 Malignant neoplasm of unspecified site of right female breast: Secondary | ICD-10-CM | POA: Diagnosis not present

## 2021-09-19 ENCOUNTER — Encounter: Payer: Self-pay | Admitting: Hematology and Oncology

## 2021-09-19 ENCOUNTER — Inpatient Hospital Stay: Payer: Medicare Other | Attending: Oncology | Admitting: Hematology and Oncology

## 2021-09-19 DIAGNOSIS — C50411 Malignant neoplasm of upper-outer quadrant of right female breast: Secondary | ICD-10-CM | POA: Diagnosis not present

## 2021-09-19 DIAGNOSIS — M810 Age-related osteoporosis without current pathological fracture: Secondary | ICD-10-CM | POA: Diagnosis not present

## 2021-09-19 DIAGNOSIS — C50919 Malignant neoplasm of unspecified site of unspecified female breast: Secondary | ICD-10-CM | POA: Diagnosis not present

## 2021-09-19 DIAGNOSIS — N6092 Unspecified benign mammary dysplasia of left breast: Secondary | ICD-10-CM

## 2021-09-19 DIAGNOSIS — C792 Secondary malignant neoplasm of skin: Secondary | ICD-10-CM

## 2021-09-19 DIAGNOSIS — Z170 Estrogen receptor positive status [ER+]: Secondary | ICD-10-CM | POA: Diagnosis not present

## 2021-09-19 NOTE — Assessment & Plan Note (Signed)
Stage IIB hormone receptor positive right breast cancer, January 2018 treated with lumpectomy and adjuvant hormonal therapy for about 3 years.

## 2021-09-19 NOTE — Assessment & Plan Note (Signed)
Atypical ductal hyperplasia of the left breast, August 2020, status post lumpectomy.

## 2021-09-19 NOTE — Assessment & Plan Note (Addendum)
Osteoporosis.  She is now on Prolia every 6 months and is due today. Calcium and vitamin D have been discontinued due to significant renal lithiasis.  Bone density from December 2022 remains stable. Her next Prolia is due in October

## 2021-09-19 NOTE — Progress Notes (Signed)
Patient Care Team: Nicoletta Dress, MD as PCP - General (Internal Medicine) Berniece Salines, DO as PCP - Cardiology (Cardiology)  Clinic Day:  09/19/2021  Referring physician: Nicoletta Dress, MD  ASSESSMENT & PLAN:   Assessment & Plan: Breast cancer [facility]) Stage IIB hormone receptor positive right breast cancer, January 2018 treated with lumpectomy and adjuvant hormonal therapy for about 3 years.      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  She underwent excisional biopsy with Dr. Lilia Pro.  HER2 was equivocal 2+, but negative by FISH.  Estrogen receptor was positive at 100% and progesterone receptor was positive at 5%.  Ki67 was 15%.  We have had several discussions regarding risks and benefits of hormonal therapy for this frail elderly woman with severe osteoporosis. We decided not to pursue adjuvant hormonal therapy. The margins were clear, but she is at high risk for recurrence due to the perineural and lymphovascular invasion. Unfortunately she now has recurrence.  Age-related osteoporosis without current pathological fracture Osteoporosis.  She is now on Prolia every 6 months and is due today. Calcium and vitamin D have been discontinued due to significant renal lithiasis.  Bone density from December 2022 remains stable. Her next Prolia is due in October  Secondary malignant neoplasm of skin of breast ([place]) Recurrence in the skin of the right lateral breast, biopsy proven and consistent with breast primary. The estrogen receptors are positive at 100%, PR is negative, HER 2 is negative and Ki-67 is 10%. We are now forced to place her on hormonal therapy to treat this but fortunately she has been on Prolia for  nearly 2 years. She was evaluated by Dr. Lilia Pro this week and he feels the biopsy site is healing well and if additional removal is needed, he can do that in the office. She continues on Letrozole without issue.     The patient understands the plans discussed today and is in agreement with them.  She knows to contact our office if she develops concerns prior to her next appointment.    Melodye Ped, NP  [facility] [address] [place] [address] Dept: [phone] Dept Fax: [phone]   No orders of the defined types were placed in this encounter.     CHIEF COMPLAINT:  CC: A 88 year old female with history of breast cancer here for 1 month evalaution Current Treatment:  Letrozole  INTERVAL HISTORY:  Sukaina is here today for repeat clinical assessment. She denies fevers or chills. She denies pain. Her appetite is good. Her weight has been stable.  I have reviewed the past medical history, past surgical history, social history and family history with the patient and they are unchanged from previous note.  ALLERGIES:  is allergic to iodinated contrast media, ticagrelor, and statins.  MEDICATIONS:  Current Outpatient Medications  Medication Sig Dispense Refill   aspirin EC 81 MG EC tablet Take 1 tablet (81 mg total) by mouth daily. Swallow whole. 30 tablet 11   clopidogrel (PLAVIX) 75 MG tablet Take 75 mg by mouth daily.     clotrimazole-betamethasone (LOTRISONE) cream APPLY TO RASH ON FEET TWICE DAILY. 30 g 0   fluticasone (FLONASE) 50 MCG/ACT nasal spray  Place 2 sprays into both nostrils daily.     furosemide (LASIX) 20 MG tablet Take 1 tablet by mouth once daily 90 tablet 3   letrozole (FEMARA) 2.5 MG tablet Take 1 tablet (2.5 mg total) by mouth daily. 30 tablet 5   levothyroxine (SYNTHROID) 50 MCG tablet Take 50 mcg by mouth daily before breakfast.     MAGNESIUM CITRATE PO Take 400 mg by mouth in  the morning, at noon, and at bedtime.     metoprolol tartrate (LOPRESSOR) 50 MG tablet Take 50 mg by mouth 3 (three) times daily.     mupirocin ointment (BACTROBAN) 2 % Apply 1 application topically 3 (three) times daily.     nitroGLYCERIN (NITROSTAT) 0.4 MG SL tablet Place 0.4 mg under the tongue every 5 (five) minutes as needed for chest pain.     ondansetron (ZOFRAN ODT) 4 MG disintegrating tablet Take 1 tablet (4 mg total) by mouth every 8 (eight) hours as needed for nausea or vomiting. 20 tablet 0   pantoprazole (PROTONIX) 40 MG tablet Take by mouth.     promethazine (PHENERGAN) 12.5 MG tablet Take 12.5 mg by mouth 4 (four) times daily as needed for nausea/vomiting.     Red Yeast Rice 600 MG CAPS Take 600 mg by mouth in the morning and at bedtime.     spironolactone (ALDACTONE) 25 MG tablet Take 0.5 tablets (12.5 mg total) by mouth daily. 30 tablet 0   Terbinafine HCl (LAMISIL AT SPRAY) 1 % SOLN Spray in between toes once daily at bedtime 125 mL 0   traMADol (ULTRAM) 50 MG tablet Take 50 mg by mouth every 6 (six) hours as needed for severe pain.     No current facility-administered medications for this visit.    HISTORY OF PRESENT ILLNESS:   Oncology History  Breast cancer ([place])  02/20/2016 Cancer Staging   Staging form: Breast, AJCC 8th Edition - Clinical stage from 02/20/2016: Stage IIA (cT2(2), cN1(sn), cM0, G2, ER+, PR+, HER2-) - Signed by Derwood Kaplan, MD on 10/04/2020 Histopathologic type: Infiltrating duct carcinoma, NOS Stage prefix: Initial diagnosis Method of lymph node assessment: Sentinel lymph node biopsy Nuclear grade: G2 Multigene prognostic tests performed: None Histologic grading system: 3 grade system Laterality: Right Tumor size (mm): 24 Multiple tumors: Yes Number of tumors: 2 Lymph-vascular invasion (LVI): LVI not present (absent)/not identified Diagnostic confirmation: Positive histology Specimen type: Excision Staged by: Managing  physician Menopausal status: Postmenopausal Ki-67 (%): 10 Stage used in treatment planning: Yes National guidelines used in treatment planning: Yes Type of national guideline used in treatment planning: NCCN Staging comments: Treated with lumpectomy and 2 years anastrazole, then developed new lesion on left   01/01/2020 Initial Diagnosis   Breast cancer ([place])   Malignant neoplasm of upper-outer quadrant of right female breast ([place])  01/01/2020 Initial Diagnosis   Malignant neoplasm of upper-outer quadrant of right female breast ([place])   09/16/2020 Cancer Staging   Staging form: Breast, AJCC 8th Edition - Clinical stage from 09/16/2020: Stage IA (cT1c, cN0, cM0, G2, ER+, PR+, HER2-) - Signed by Derwood Kaplan, MD on 10/04/2020 Histopathologic type: Infiltrating duct carcinoma, NOS Stage prefix: Initial diagnosis Method of lymph node assessment: Clinical Multigene prognostic tests performed: None Histologic grading system: 3 grade system Laterality: Right Tumor size (mm): 15 Lymph-vascular invasion (LVI): LVI present/identified, NOS Diagnostic confirmation: Positive histology Specimen type: Excision Staged by: Managing physician Menopausal status: Postmenopausal Ki-67 (%): 15 Stage used in treatment planning: Yes National guidelines used in  treatment planning: Yes Type of national guideline used in treatment planning: NCCN       REVIEW OF SYSTEMS:   Constitutional: Denies fevers, chills or abnormal weight loss Eyes: Denies blurriness of vision Ears, nose, mouth, throat, and face: Denies mucositis or sore throat Respiratory: Denies cough, dyspnea or wheezes Cardiovascular: Denies palpitation, chest discomfort or lower extremity swelling Gastrointestinal:  Denies nausea, heartburn or change in bowel habits Skin: Denies abnormal skin rashes Lymphatics: Denies new lymphadenopathy or easy bruising Neurological:Denies numbness, tingling or new weaknesses Behavioral/Psych:  Mood is stable, no new changes  All other systems were reviewed with the patient and are negative.   VITALS:  Blood pressure (!) 177/74, pulse 72, temperature 97.6 F (36.4 C), temperature source Oral, resp. rate 20, height $RemoveBe'5\' 4"'[id number]$  (1.626 m), weight 131 lb 12.8 oz (59.8 kg), SpO2 95 %.  Wt Readings from Last 3 Encounters:  09/19/21 131 lb 12.8 oz (59.8 kg)  05/16/21 126 lb 1.3 oz (57.2 kg)  05/16/21 126 lb (57.2 kg)    Body mass index is 22.62 kg/m.  Performance status (ECOG): 1 - Symptomatic but completely ambulatory  PHYSICAL EXAM:   GENERAL:alert, no distress and comfortable SKIN: skin color, texture, turgor are normal, no rashes or significant lesions EYES: normal, Conjunctiva are pink and non-injected, sclera clear OROPHARYNX:no exudate, no erythema and lips, buccal mucosa, and tongue normal  NECK: supple, thyroid normal size, non-tender, without nodularity LYMPH:  no palpable lymphadenopathy in the cervical, axillary or inguinal LUNGS: clear to auscultation and percussion with normal breathing effort HEART: regular rate & rhythm and no murmurs and no lower extremity edema ABDOMEN:abdomen soft, non-tender and normal bowel sounds Musculoskeletal:no cyanosis of digits and no clubbing  NEURO: alert & oriented x 3 with fluent speech, no focal motor/sensory deficits  LABORATORY DATA:  I have reviewed the data as listed    Component Value Date/Time   NA 140 08/15/2021 0000   K 4.3 08/15/2021 0000   CL 102 08/15/2021 0000   CO2 26 (A) 08/15/2021 0000   GLUCOSE 89 10/25/2020 0959   GLUCOSE 162 (H) 02/28/2020 1054   BUN 33 (A) 08/15/2021 0000   CREATININE 1.1 08/15/2021 0000   CREATININE 1.29 (H) 10/25/2020 0959   CALCIUM 9.4 08/15/2021 0000   PROT 6.3 (L) 12/06/2019 0412   ALBUMIN 4.2 08/15/2021 0000   AST 23 08/15/2021 0000   ALT 21 08/15/2021 0000   ALKPHOS 46 08/15/2021 0000   BILITOT 0.6 12/06/2019 0412   GFRNONAA 44 (L) 02/28/2020 1054   GFRAA 40 (L) 01/02/2020  1522    No results found for: "SPEP", "UPEP"  Lab Results  Component Value Date   WBC 7.9 08/15/2021   NEUTROABS 5.06 08/15/2021   HGB 11.1 (A) 08/15/2021   HCT 35 (A) 08/15/2021   MCV 85 05/17/2020   PLT 263 08/15/2021      Chemistry      Component Value Date/Time   NA 140 08/15/2021 0000   K 4.3 08/15/2021 0000   CL 102 08/15/2021 0000   CO2 26 (A) 08/15/2021 0000   BUN 33 (A) 08/15/2021 0000   CREATININE 1.1 08/15/2021 0000   CREATININE 1.29 (H) 10/25/2020 0959   GLU 80 08/15/2021 0000      Component Value Date/Time   CALCIUM 9.4 08/15/2021 0000   ALKPHOS 46 08/15/2021 0000   AST 23 08/15/2021 0000   ALT 21 08/15/2021 0000   BILITOT 0.6 12/06/2019 0412       RADIOGRAPHIC STUDIES: I  have personally reviewed the radiological images as listed and agreed with the findings in the report. No results found.

## 2021-09-19 NOTE — Assessment & Plan Note (Signed)
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  She underwent excisional biopsy with Dr. Morgan.  HER2 was equivocal 2+, but negative by FISH.  Estrogen receptor was positive at 100% and progesterone receptor was positive at 5%.  Ki67 was 15%.  We have had several discussions regarding risks and benefits of hormonal therapy for this frail elderly woman with severe osteoporosis. We decided not to pursue adjuvant hormonal therapy. The margins were clear, but she is at high risk for recurrence due to the perineural and lymphovascular invasion. Unfortunately she now has recurrence. 

## 2021-09-19 NOTE — Assessment & Plan Note (Addendum)
Recurrence in the skin of the right lateral breast, biopsy proven and consistent with breast primary. The estrogen receptors are positive at 100%, PR is negative, HER 2 is negative and Ki-67 is 10%. We are now forced to place her on hormonal therapy to treat this but fortunately she has been on Prolia for nearly 2 years. She was evaluated by Dr. Lilia Pro this week and he feels the biopsy site is healing well and if additional removal is needed, he can do that in the office. She continues on Letrozole without issue.

## 2021-10-07 DIAGNOSIS — I509 Heart failure, unspecified: Secondary | ICD-10-CM | POA: Diagnosis not present

## 2021-10-13 ENCOUNTER — Ambulatory Visit: Payer: Medicare Other | Admitting: Podiatry

## 2021-10-13 ENCOUNTER — Encounter: Payer: Self-pay | Admitting: Podiatry

## 2021-10-13 DIAGNOSIS — L84 Corns and callosities: Secondary | ICD-10-CM | POA: Diagnosis not present

## 2021-10-13 DIAGNOSIS — B351 Tinea unguium: Secondary | ICD-10-CM | POA: Diagnosis not present

## 2021-10-13 DIAGNOSIS — L89611 Pressure ulcer of right heel, stage 1: Secondary | ICD-10-CM | POA: Diagnosis not present

## 2021-10-13 DIAGNOSIS — M79609 Pain in unspecified limb: Secondary | ICD-10-CM

## 2021-10-13 NOTE — Progress Notes (Signed)
  Subjective:  Patient ID: Baldemar Lenis, female    DOB: Dec 10, 1929,  MRN: [id number]  Chief Complaint  Patient presents with   Nail Problem    Nail trim     88 y.o. female presents with the above complaint. History confirmed with patient.  Patient presents for routine foot care as well as recheck on preulcerative callus present on the plantar aspect of the right foot.  She has been seen monthly for wound care for wound to the right great toe which is now healed.  Per report from family who is with the patient at this visit there was a wound and some black discoloration at the distal tip of the right great toe that is subsequently healed.  Objective:  Physical Exam: warm, good capillary refill, nail exam onychomycosis of the toenails and dystrophic nails,  Small preulcerative callus present at the plantar aspect of the right heel.  Overlying hyperkeratotic tissue no evidence of infection no erythema drainage purulence.  Does not probe deep very superficial nature.  DP pulses palpable, PT pulses palpable, and protective sensation intact Left Foot: normal exam, no swelling, tenderness, instability; ligaments intact, full range of motion of all ankle/foot joints  Right Foot: At the distal aspect of the right hallux there is mild eschar and hyperkeratotic tissue present however no open wound.  No erythema or drainage or edema of the right great toe.  At the right plantar heel there is a small hyperkeratotic lesion present centrally.  No erythema or drainage from this area.    Assessment:   1. Pre-ulcerative calluses   2. Pain due to onychomycosis of nail   3. Pressure injury of right heel, stage 1      Plan:  Patient was evaluated and treated and all questions answered.  Preulcerative callus, Onychomycosis, and Onychodystrophy -Nails x 10 palliatively debrided secondary to pain Ulcer -I discussed with the patient that she does have a preulcerative callus present at the plantar aspect of  the right heel.  I debrided the lesion sharply with a 15 blade to level of subcutaneous tissue with removal of all nonviable hyperkeratotic tissue.  Underlying this hyperkeratotic tissue there is a small superficial skin ulceration approximately 3 mm x 2 mm no signs of infection.  I recommend daily adhesive bandage with Betadine applied to the area to prevent maceration keep the area clean and dry.  Patient will follow-up in 1 month for ongoing evaluation care of this right plantar heel wound. -We discussed the etiology and factors that are a part of the wound healing process.  We also discussed the risk of infection both soft tissue and osteomyelitis from open ulceration.  Discussed the risk of limb loss if this happens or worsens.   Return in about 4 weeks (around 11/10/2021) for Right heel wound.         Everitt Amber, DPM [facility]

## 2021-10-17 ENCOUNTER — Telehealth: Payer: Self-pay | Admitting: Oncology

## 2021-10-17 NOTE — Telephone Encounter (Signed)
Contacted pt to reschedule lab appt [only] to either Oct 5th or 6th (if possible, requested by Beverley Fiedler) due to the insurance requesting labs prior to authorizing her next Prolia injection. Unable to reach via phone, voicemail was left.

## 2021-11-03 ENCOUNTER — Other Ambulatory Visit: Payer: Self-pay | Admitting: Pharmacist

## 2021-11-04 ENCOUNTER — Other Ambulatory Visit: Payer: Self-pay | Admitting: Hematology and Oncology

## 2021-11-04 ENCOUNTER — Inpatient Hospital Stay: Payer: Medicare Other | Attending: Oncology

## 2021-11-04 DIAGNOSIS — D539 Nutritional anemia, unspecified: Secondary | ICD-10-CM | POA: Diagnosis not present

## 2021-11-04 DIAGNOSIS — C50919 Malignant neoplasm of unspecified site of unspecified female breast: Secondary | ICD-10-CM

## 2021-11-04 DIAGNOSIS — M810 Age-related osteoporosis without current pathological fracture: Secondary | ICD-10-CM | POA: Insufficient documentation

## 2021-11-04 DIAGNOSIS — Z79899 Other long term (current) drug therapy: Secondary | ICD-10-CM | POA: Insufficient documentation

## 2021-11-04 DIAGNOSIS — D649 Anemia, unspecified: Secondary | ICD-10-CM | POA: Diagnosis not present

## 2021-11-04 DIAGNOSIS — C50411 Malignant neoplasm of upper-outer quadrant of right female breast: Secondary | ICD-10-CM | POA: Diagnosis not present

## 2021-11-04 LAB — COMPREHENSIVE METABOLIC PANEL
Albumin: 4 (ref 3.5–5.0)
Calcium: 9.4 (ref 8.7–10.7)

## 2021-11-04 LAB — BASIC METABOLIC PANEL
BUN: 27 — AB (ref 4–21)
CO2: 28 — AB (ref 13–22)
Chloride: 105 (ref 99–108)
Creatinine: 1.3 — AB (ref 0.5–1.1)
Glucose: 76
Potassium: 4.7 mEq/L (ref 3.5–5.1)
Sodium: 137 (ref 137–147)

## 2021-11-04 LAB — HEPATIC FUNCTION PANEL
ALT: 15 U/L (ref 7–35)
AST: 32 (ref 13–35)
Alkaline Phosphatase: 45 (ref 25–125)
Bilirubin, Total: 0.4

## 2021-11-04 LAB — CBC AND DIFFERENTIAL
HCT: 33 — AB (ref 36–46)
Hemoglobin: 10.7 — AB (ref 12.0–16.0)
Neutrophils Absolute: 4.99
Platelets: 302 10*3/uL (ref 150–400)
WBC: 7.8

## 2021-11-04 LAB — CBC: RBC: 3.81 — AB (ref 3.87–5.11)

## 2021-11-06 DIAGNOSIS — I509 Heart failure, unspecified: Secondary | ICD-10-CM | POA: Diagnosis not present

## 2021-11-14 ENCOUNTER — Ambulatory Visit: Payer: Medicare Other | Admitting: Podiatry

## 2021-11-14 DIAGNOSIS — L84 Corns and callosities: Secondary | ICD-10-CM

## 2021-11-14 DIAGNOSIS — I739 Peripheral vascular disease, unspecified: Secondary | ICD-10-CM | POA: Diagnosis not present

## 2021-11-14 DIAGNOSIS — L97514 Non-pressure chronic ulcer of other part of right foot with necrosis of bone: Secondary | ICD-10-CM

## 2021-11-14 NOTE — Progress Notes (Signed)
  Subjective:  Patient ID: Olivia [name], female    DOB: 1929-04-26,  MRN: [id number]  Chief Complaint  Patient presents with   Foot Ulcer    3 week follow up right heel ulcer    88 y.o. female presents with the above complaint.  Patient presenting for follow up Right heel wound that was noticed after debridement of pre ulcerative callus at last visit. Has been doing betadine applied to the area daily, covering with bandaid. Patients family with her says it looks like its healing in well.   Objective:  Physical Exam: warm, good capillary refill, nail exam onychomycosis of the toenails and dystrophic nails,  Area of prior superficial breakdown plantar right heel has healed at this time Decreased hyperkeratotic tissue no evidence of infection no erythema.  DP pulses palpable, PT pulses palpable, and protective sensation intact Left Foot: normal exam, no swelling, tenderness, instability; ligaments intact, full range of motion of all ankle/foot joints  Right Foot: At the distal aspect of the right hallux there is mild eschar and hyperkeratotic tissue present however no open wound.  No erythema or drainage or edema of the right great toe.  Assessment:   1. Pre-ulcerative calluses   2. PAD (peripheral artery disease) ([place])   3. 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   # Pre ulcerative callus right heel and distal medial aspect right hallux, prior heel wound limited to breakdown of skin.  - Ulcer plantar right heel is fully healed at this time, no further dressing or wound care needed - Recommend ongoing monitoring for buildup of callus at the area - Recommend lotion to the right heel - Continue use of gel spacers for hallux valugs deformity bilaterally - Patient will follow up in 2 mo for ongoing [place]  Return in about 2 months (around 01/14/2022) for [place].         Everitt Amber,  DPM [facility] [place] [facility]

## 2021-11-19 ENCOUNTER — Telehealth: Payer: Self-pay | Admitting: Oncology

## 2021-11-19 ENCOUNTER — Inpatient Hospital Stay: Payer: Medicare Other | Admitting: Oncology

## 2021-11-19 ENCOUNTER — Other Ambulatory Visit: Payer: Medicare Other

## 2021-11-19 ENCOUNTER — Inpatient Hospital Stay: Payer: Medicare Other

## 2021-11-19 ENCOUNTER — Telehealth: Payer: Self-pay

## 2021-11-19 VITALS — Ht 64.0 in | Wt 133.4 lb

## 2021-11-19 VITALS — BP 147/52 | HR 77 | Resp 20

## 2021-11-19 DIAGNOSIS — C50919 Malignant neoplasm of unspecified site of unspecified female breast: Secondary | ICD-10-CM

## 2021-11-19 DIAGNOSIS — C792 Secondary malignant neoplasm of skin: Secondary | ICD-10-CM | POA: Diagnosis not present

## 2021-11-19 DIAGNOSIS — M810 Age-related osteoporosis without current pathological fracture: Secondary | ICD-10-CM

## 2021-11-19 DIAGNOSIS — Z79899 Other long term (current) drug therapy: Secondary | ICD-10-CM | POA: Diagnosis not present

## 2021-11-19 MED ORDER — DENOSUMAB 60 MG/ML ~~LOC~~ SOSY
60.0000 mg | PREFILLED_SYRINGE | Freq: Once | SUBCUTANEOUS | Status: AC
  Administered 2021-11-19: 60 mg via SUBCUTANEOUS
  Filled 2021-11-19: qty 1

## 2021-11-19 NOTE — Patient Outreach (Signed)
  Care Coordination   Initial Visit Note   11/19/2021 Name: Olivia [name] MRN: [id number] DOB: [birth year]  Olivia [name] is a 88 y.o. year old female who sees Nicoletta Dress, MD for primary care. I spoke with  Olivia [name]'s daughter by phone today.  What matters to the patients health and wellness today?  Placed call to patient and spoke with daughter who manages her mother care. Daughter reports patient is doing well. Reports no concerns at this time.   SDOH assessments and interventions completed:  No     Care Coordination Interventions Activated:  No  Care Coordination Interventions:  No, not indicated   Follow up plan: No further intervention required.   Encounter Outcome:  Pt. Refused   Tomasa Rand, RN, BSN, CEN [facility] [employer] [phone]

## 2021-11-19 NOTE — Telephone Encounter (Signed)
11/19/21 next appt scheduled and confirmed with patient

## 2021-11-19 NOTE — Progress Notes (Signed)
Patient Care Team: Nicoletta Dress, MD as PCP - General (Internal Medicine) Berniece Salines, DO as PCP - Cardiology (Cardiology)  Clinic Day: 11/19/21  Referring physician: Nicoletta Dress, MD  ASSESSMENT & PLAN:   Assessment & Plan: Expand All Collapse All   Patient Care Team: Nicoletta Dress, MD as PCP - General (Internal Medicine) Berniece Salines, DO as PCP - Cardiology (Cardiology)   Clinic Day:  09/19/2021   Referring physician: Nicoletta Dress, MD   ASSESSMENT & PLAN:    Assessment & Plan: Breast cancer [facility]) Stage IIB hormone receptor positive right breast cancer, January 2018 treated with lumpectomy and adjuvant hormonal therapy for about 3 years.      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  She underwent excisional biopsy with Dr. Lilia Pro.  HER2 was equivocal 2+, but negative by FISH.  Estrogen receptor was positive at 100% and progesterone receptor was positive at 5%.  Ki67 was 15%.  We have had several discussions regarding risks and benefits of hormonal therapy for this frail elderly woman with severe osteoporosis. We decided not to pursue adjuvant hormonal therapy. The margins were clear, but she is at high risk for recurrence due to the perineural and lymphovascular invasion. Unfortunately she now has recurrence.   Age-related osteoporosis without current pathological fracture Osteoporosis.  She is now on Prolia every 6 months and is due today. Calcium and vitamin D have been discontinued due to significant renal lithiasis.  Bone density from December 2022 remains stable. Her next Prolia is due in October   Secondary malignant neoplasm of skin of breast ([place]) Recurrence in the skin of  the right lateral breast, biopsy proven and consistent with breast primary. The estrogen receptors are positive at 100%, PR is negative, HER 2 is negative and Ki-67 is 10%. We are now forced to place her on hormonal therapy to treat this but fortunately she has been on Prolia for nearly 2 years.        At this time she is chosen to stop the letrozole due to generalized arthralgias.  I feel this is reasonable given her age and quality of life in someone who is receiving palliative care.  We did discuss the possibility of Faslodex injections and she is not thrilled with the idea of taking shots.  She is mildly anemic and so we can simply monitor that.  Her cancer is under control and if it remains so we can plan just surveillance.  I will therefore see her back in 1 month for reexamination. The patient and her daughter understand the plans discussed today and are in agreement with them.  She knows to contact our office if she develops concerns prior to her next appointment.    Olivia Kaplan, MD  [facility] [address] [place] [address] Dept: [phone] Dept Fax: [phone]   No orders of the defined types were placed in this encounter.     CHIEF COMPLAINT:  CC: A 88 year old female with history of breast cancer here for 1 month evalaution Current Treatment:  Surveillance  INTERVAL HISTORY:  Olivia [name] is here today for repeat clinical assessment. She has now stopped the letrozole due to  severe generalized arthralgias.  This is reasonable since this is palliative in nature and we want her to have quality of life at age 84.  If her cancer should recur/progress, we could consider Faslodex injections.  For now we will plan to simply monitor her.  Her labs look good but her hemoglobin has dropped just slightly from 11.1 to 10.7.  I will plan to recheck it later in the year. She denies fevers or chills. She denies pain.  Her appetite is good. Her weighthas increased 2 pounds.  I have reviewed the past medical history, past surgical history, social history and family history with the patient and they are unchanged from previous note.  ALLERGIES:  is allergic to iodinated contrast media, ticagrelor, and statins.  MEDICATIONS:  Current Outpatient Medications  Medication Sig Dispense Refill   aspirin EC 81 MG EC tablet Take 1 tablet (81 mg total) by mouth daily. Swallow whole. 30 tablet 11   clopidogrel (PLAVIX) 75 MG tablet Take 75 mg by mouth daily.     clotrimazole-betamethasone (LOTRISONE) cream APPLY TO RASH ON FEET TWICE DAILY. 30 g 0   fluticasone (FLONASE) 50 MCG/ACT nasal spray Place 2 sprays into both nostrils daily.     furosemide (LASIX) 20 MG tablet Take 1 tablet by mouth once daily 90 tablet 3   levothyroxine (SYNTHROID) 50 MCG tablet Take 50 mcg by mouth daily before breakfast.     LORazepam (ATIVAN) 0.5 MG tablet Take 0.5 mg by mouth 3 (three) times daily as needed.     MAGNESIUM CITRATE PO Take 400 mg by mouth in the morning, at noon, and at bedtime.     metoprolol tartrate (LOPRESSOR) 50 MG tablet Take 50 mg by mouth 3 (three) times daily.     mupirocin ointment (BACTROBAN) 2 % Apply 1 application topically 3 (three) times daily.     nitroGLYCERIN (NITROSTAT) 0.4 MG SL tablet Place 0.4 mg under the tongue every 5 (five) minutes as needed for chest pain.     ondansetron (ZOFRAN ODT) 4 MG disintegrating tablet Take 1 tablet (4 mg total) by mouth every 8 (eight) hours as needed for nausea or vomiting. 20 tablet 0   pantoprazole (PROTONIX) 40 MG tablet Take by mouth.     promethazine (PHENERGAN) 12.5 MG tablet Take 12.5 mg by mouth 4 (four) times daily as needed for nausea/vomiting.     Red Yeast Rice 600 MG CAPS Take 600 mg by mouth in the morning and at bedtime.     spironolactone (ALDACTONE) 25 MG tablet Take 0.5 tablets (12.5 mg total) by mouth daily. 30 tablet 0   Terbinafine HCl (LAMISIL AT  SPRAY) 1 % SOLN Spray in between toes once daily at bedtime 125 mL 0   traMADol (ULTRAM) 50 MG tablet Take 50 mg by mouth every 6 (six) hours as needed for severe pain.     No current facility-administered medications for this visit.    HISTORY OF PRESENT ILLNESS:   Oncology History  Breast cancer ([place])  02/20/2016 Cancer Staging   Staging form: Breast, AJCC 8th Edition - Clinical stage from 02/20/2016: Stage IIA (cT2(2), cN1(sn), cM0, G2, ER+, PR+, HER2-) - Signed by Olivia Kaplan, MD on 10/04/2020 Histopathologic type: Infiltrating duct carcinoma, NOS Stage prefix: Initial diagnosis Method of lymph node assessment: Sentinel lymph node biopsy Nuclear grade: G2 Multigene prognostic tests performed: None Histologic grading system: 3 grade system Laterality: Right Tumor size (mm): 24 Multiple tumors: Yes Number of tumors: 2 Lymph-vascular invasion (  LVI): LVI not present (absent)/not identified Diagnostic confirmation: Positive histology Specimen type: Excision Staged by: Managing physician Menopausal status: Postmenopausal Ki-67 (%): 10 Stage used in treatment planning: Yes National guidelines used in treatment planning: Yes Type of national guideline used in treatment planning: NCCN Staging comments: Treated with lumpectomy and 2 years anastrazole, then developed new lesion on left   01/01/2020 Initial Diagnosis   Breast cancer ([place])   Malignant neoplasm of upper-outer quadrant of right female breast ([place])  01/01/2020 Initial Diagnosis   Malignant neoplasm of upper-outer quadrant of right female breast ([place])   09/16/2020 Cancer Staging   Staging form: Breast, AJCC 8th Edition - Clinical stage from 09/16/2020: Stage IA (cT1c, cN0, cM0, G2, ER+, PR+, HER2-) - Signed by Olivia Kaplan, MD on 10/04/2020 Histopathologic type: Infiltrating duct carcinoma, NOS Stage prefix: Initial diagnosis Method of lymph node assessment: Clinical Multigene prognostic tests performed:  None Histologic grading system: 3 grade system Laterality: Right Tumor size (mm): 15 Lymph-vascular invasion (LVI): LVI present/identified, NOS Diagnostic confirmation: Positive histology Specimen type: Excision Staged by: Managing physician Menopausal status: Postmenopausal Ki-67 (%): 15 Stage used in treatment planning: Yes National guidelines used in treatment planning: Yes Type of national guideline used in treatment planning: NCCN       REVIEW OF SYSTEMS:   Constitutional: Denies fevers, chills or abnormal weight loss Eyes: Denies blurriness of vision Ears, nose, mouth, throat, and face: Denies mucositis or sore throat Respiratory: Denies cough, dyspnea or wheezes Cardiovascular: Denies palpitation, chest discomfort or lower extremity swelling Gastrointestinal:  Denies nausea, heartburn or change in bowel habits Skin: Denies abnormal skin rashes Lymphatics: Denies new lymphadenopathy or easy bruising Neurological:Denies numbness, tingling or new weaknesses Behavioral/Psych: Mood is stable, no new changes  All other systems were reviewed with the patient and are negative.   VITALS:  Height $Remov'5\' 4"'iYfBpH$  (1.626 m), weight 133 lb 6.4 oz (60.5 kg).  Wt Readings from Last 3 Encounters:  11/19/21 133 lb 6.4 oz (60.5 kg)  09/19/21 131 lb 12.8 oz (59.8 kg)  05/16/21 126 lb 1.3 oz (57.2 kg)    Body mass index is 22.9 kg/m.  Performance status (ECOG): 1 - Symptomatic but completely ambulatory  PHYSICAL EXAM:   GENERAL:alert, no distress and comfortable SKIN: skin color, texture, turgor are normal, no rashes or significant lesions EYES: normal, Conjunctiva are pink and non-injected, sclera clear OROPHARYNX:no exudate, no erythema and lips, buccal mucosa, and tongue normal  NECK: supple, thyroid normal size, non-tender, without nodularity LYMPH:  no palpable lymphadenopathy in the cervical, axillary or inguinal LUNGS: clear to auscultation and percussion with normal breathing  effort HEART: regular rate & rhythm and no murmurs and no lower extremity edema ABDOMEN:abdomen soft, non-tender and normal bowel sounds Musculoskeletal:no cyanosis of digits and no clubbing  NEURO: alert & oriented x 3 with fluent speech, no focal motor/sensory deficits  LABORATORY DATA:  I have reviewed the data as listed    Component Value Date/Time   NA 137 11/04/2021 0000   K 4.7 11/04/2021 0000   CL 105 11/04/2021 0000   CO2 28 (A) 11/04/2021 0000   GLUCOSE 89 10/25/2020 0959   GLUCOSE 162 (H) 02/28/2020 1054   BUN 27 (A) 11/04/2021 0000   CREATININE 1.3 (A) 11/04/2021 0000   CREATININE 1.29 (H) 10/25/2020 0959   CALCIUM 9.4 11/04/2021 0000   PROT 6.3 (L) 12/06/2019 0412   ALBUMIN 4.0 11/04/2021 0000   AST 32 11/04/2021 0000   ALT 15 11/04/2021 0000   ALKPHOS  45 11/04/2021 0000   BILITOT 0.6 12/06/2019 0412   GFRNONAA 44 (L) 02/28/2020 1054   GFRAA 40 (L) 01/02/2020 1522    No results found for: "SPEP", "UPEP"  Lab Results  Component Value Date   WBC 7.8 11/04/2021   NEUTROABS 4.99 11/04/2021   HGB 10.7 (A) 11/04/2021   HCT 33 (A) 11/04/2021   MCV 85 05/17/2020   PLT 302 11/04/2021      Chemistry      Component Value Date/Time   NA 137 11/04/2021 0000   K 4.7 11/04/2021 0000   CL 105 11/04/2021 0000   CO2 28 (A) 11/04/2021 0000   BUN 27 (A) 11/04/2021 0000   CREATININE 1.3 (A) 11/04/2021 0000   CREATININE 1.29 (H) 10/25/2020 0959   GLU 76 11/04/2021 0000      Component Value Date/Time   CALCIUM 9.4 11/04/2021 0000   ALKPHOS 45 11/04/2021 0000   AST 32 11/04/2021 0000   ALT 15 11/04/2021 0000   BILITOT 0.6 12/06/2019 0412       RADIOGRAPHIC STUDIES: I have personally reviewed the radiological images as listed and agreed with the findings in the report. No results found.

## 2021-11-19 NOTE — Patient Instructions (Signed)
Denosumab Injection (Osteoporosis) What is this medication? DENOSUMAB (den oh SUE mab) prevents and treats osteoporosis. It works by making your bones stronger and less likely to break (fracture). It is a monoclonal antibody. This medicine may be used for other purposes; ask your health care provider or pharmacist if you have questions. COMMON BRAND NAME(S): Prolia What should I tell my care team before I take this medication? They need to know if you have any of these conditions: Dental or gum disease, or plan to have dental surgery or a tooth pulled Infection Kidney disease Low levels of calcium or vitamin D in your blood On dialysis Poor nutrition Skin conditions Thyroid disease, or have had thyroid or parathyroid surgery Trouble absorbing minerals in your stomach or intestine An unusual reaction to denosumab, other medications, foods, dyes, or preservatives Pregnant or trying to get pregnant Breast-feeding How should I use this medication? This medication is injected under the skin. It is given by your care team in a hospital or clinic setting. A special MedGuide will be given to you before each treatment. Be sure to read this information carefully each time. Talk to your care team about the use of this medication in children. Special care may be needed. Overdosage: If you think you have taken too much of this medicine contact a poison control center or emergency room at once. NOTE: This medicine is only for you. Do not share this medicine with others. What if I miss a dose? Keep appointments for follow-up doses. It is important not to miss your dose. Call your care team if you are unable to keep an appointment. What may interact with this medication? Do not take this medication with any of the following: Other medications that contain denosumab This medication may also interact with the following: Medications that lower your chance of fighting infection Steroid medications, such  as prednisone or cortisone This list may not describe all possible interactions. Give your health care provider a list of all the medicines, herbs, non-prescription drugs, or dietary supplements you use. Also tell them if you smoke, drink alcohol, or use illegal drugs. Some items may interact with your medicine. What should I watch for while using this medication? Your condition will be monitored carefully while you are receiving this medication. You may need blood work while taking this medication. This medication may increase your risk of getting an infection. Call your care team for advice if you get a fever, chills, sore throat, or other symptoms of a cold or flu. Do not treat yourself. Try to avoid being around people who are sick. Tell your dentist and dental surgeon that you are taking this medication. You should not have major dental surgery while on this medication. See your dentist to have a dental exam and fix any dental problems before starting this medication. Take good care of your teeth while on this medication. Make sure you see your dentist for regular follow-up appointments. You should make sure you get enough calcium and vitamin D while you are taking this medication. Discuss the foods you eat and the vitamins you take with your care team. Talk to your care team if you are pregnant or think you might be pregnant. This medication can cause serious birth defects if taken during pregnancy and for 5 months after the last dose. You will need a negative pregnancy test before starting this medication. Contraception is recommended while taking this medication and for 5 months after the last dose. Your care team can   help you find the option that works for you. Talk to your care team before breastfeeding. Changes to your treatment plan may be needed. What side effects may I notice from receiving this medication? Side effects that you should report to your care team as soon as possible: Allergic  reactions--skin rash, itching, hives, swelling of the face, lips, tongue, or throat Infection--fever, chills, cough, sore throat, wounds that don't heal, pain or trouble when passing urine, general feeling of discomfort or being unwell Low calcium level--muscle pain or cramps, confusion, tingling, or numbness in the hands or feet Osteonecrosis of the jaw--pain, swelling, or redness in the mouth, numbness of the jaw, poor healing after dental work, unusual discharge from the mouth, visible bones in the mouth Severe bone, joint, or muscle pain Skin infection--skin redness, swelling, warmth, or pain Side effects that usually do not require medical attention (report these to your care team if they continue or are bothersome): Back pain Headache Joint pain Muscle pain Pain in the hands, arms, legs, or feet Runny or stuffy nose Sore throat This list may not describe all possible side effects. Call your doctor for medical advice about side effects. You may report side effects to FDA at 1-800-FDA-1088. Where should I keep my medication? This medication is given in a hospital or clinic. It will not be stored at home. NOTE: This sheet is a summary. It may not cover all possible information. If you have questions about this medicine, talk to your doctor, pharmacist, or health care provider.  2023 Elsevier/Gold Standard (2021-06-02 00:00:00)  

## 2021-12-02 DIAGNOSIS — I10 Essential (primary) hypertension: Secondary | ICD-10-CM | POA: Diagnosis not present

## 2021-12-02 DIAGNOSIS — I420 Dilated cardiomyopathy: Secondary | ICD-10-CM | POA: Diagnosis not present

## 2021-12-02 DIAGNOSIS — E782 Mixed hyperlipidemia: Secondary | ICD-10-CM | POA: Diagnosis not present

## 2021-12-02 DIAGNOSIS — Z9889 Other specified postprocedural states: Secondary | ICD-10-CM | POA: Diagnosis not present

## 2021-12-02 DIAGNOSIS — R002 Palpitations: Secondary | ICD-10-CM | POA: Diagnosis not present

## 2021-12-02 DIAGNOSIS — I2510 Atherosclerotic heart disease of native coronary artery without angina pectoris: Secondary | ICD-10-CM | POA: Diagnosis not present

## 2021-12-02 DIAGNOSIS — I25810 Atherosclerosis of coronary artery bypass graft(s) without angina pectoris: Secondary | ICD-10-CM | POA: Diagnosis not present

## 2021-12-02 DIAGNOSIS — I110 Hypertensive heart disease with heart failure: Secondary | ICD-10-CM | POA: Diagnosis not present

## 2021-12-02 DIAGNOSIS — I5021 Acute systolic (congestive) heart failure: Secondary | ICD-10-CM | POA: Diagnosis not present

## 2021-12-02 DIAGNOSIS — I255 Ischemic cardiomyopathy: Secondary | ICD-10-CM | POA: Diagnosis not present

## 2021-12-02 DIAGNOSIS — Z7982 Long term (current) use of aspirin: Secondary | ICD-10-CM | POA: Diagnosis not present

## 2021-12-02 DIAGNOSIS — Z789 Other specified health status: Secondary | ICD-10-CM | POA: Diagnosis not present

## 2021-12-02 DIAGNOSIS — I351 Nonrheumatic aortic (valve) insufficiency: Secondary | ICD-10-CM | POA: Diagnosis not present

## 2021-12-02 DIAGNOSIS — I340 Nonrheumatic mitral (valve) insufficiency: Secondary | ICD-10-CM | POA: Diagnosis not present

## 2021-12-02 DIAGNOSIS — I214 Non-ST elevation (NSTEMI) myocardial infarction: Secondary | ICD-10-CM | POA: Diagnosis not present

## 2021-12-07 DIAGNOSIS — I509 Heart failure, unspecified: Secondary | ICD-10-CM | POA: Diagnosis not present

## 2021-12-15 ENCOUNTER — Encounter: Payer: Self-pay | Admitting: Oncology

## 2021-12-19 ENCOUNTER — Encounter: Payer: Self-pay | Admitting: Oncology

## 2021-12-19 ENCOUNTER — Other Ambulatory Visit (INDEPENDENT_AMBULATORY_CARE_PROVIDER_SITE_OTHER): Payer: Medicare Other | Admitting: Oncology

## 2021-12-19 ENCOUNTER — Inpatient Hospital Stay: Payer: Medicare Other | Attending: Oncology | Admitting: Oncology

## 2021-12-19 VITALS — BP 147/67 | HR 72 | Temp 98.6°F | Resp 18 | Ht 64.0 in | Wt 135.6 lb

## 2021-12-19 DIAGNOSIS — C50919 Malignant neoplasm of unspecified site of unspecified female breast: Secondary | ICD-10-CM

## 2021-12-19 DIAGNOSIS — C792 Secondary malignant neoplasm of skin: Secondary | ICD-10-CM

## 2021-12-19 NOTE — Progress Notes (Signed)
Patient Care Team: Nicoletta Dress, MD as PCP - General (Internal Medicine) Berniece Salines, DO as PCP - Cardiology (Cardiology)  Clinic Day: 12/19/21   Referring physician: Nicoletta Dress, MD  ASSESSMENT & PLAN:   Breast cancer [facility]) Stage IIB hormone receptor positive right breast cancer, January 2018 treated with lumpectomy and adjuvant hormonal therapy for about 3 years.      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  She underwent excisional biopsy with Dr. Lilia Pro.  HER2 was equivocal 2+, but negative by FISH.  Estrogen receptor was positive at 100% and progesterone receptor was positive at 5%.  Ki67 was 15%.  We have had several discussions regarding risks and benefits of hormonal therapy for this frail elderly woman with severe osteoporosis. We decided not to pursue adjuvant hormonal therapy. The margins were clear, but she was at high risk for recurrence due to the perineural and lymphovascular invasion.    Age-related osteoporosis without current pathological fracture Osteoporosis.  She is now on Prolia every 6 months and is due today. Calcium and vitamin D have been discontinued due to significant renal lithiasis.  Bone density from December 2022 remains stable. Her next Prolia is due in April 2024.   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 The estrogen receptors are positive at 100%, PR is negative, HER 2 is negative and Ki-67 is 10%. We are now forced to place her on hormonal therapy to treat this but fortunately she has been on Prolia for nearly 2 years. We have stopped the Letrozole at her  request but she should do well and will need only surveillance. We could consider low dose Faslodex if she has recurrence disease. I don't feel any problems.    Plan: We will continue to stay off the letrozole due to generalized arthralgias. I feel this is reasonable given her age and quality of life in someone who is receiving palliative care. She is mildly anemic and so we can simply monitor that. Her cancer is under control and if it remains so we can plan just surveillance. I will therefore see her back in 3 months for reexamination with repeat CBC and CMP. The patient and her daughter understand the plans discussed today and are in agreement with them.  She knows to contact our office if she develops concerns prior to her next appointment.  I provided 20 minutes of face-to-face time during this encounter and > 50% was spent counseling as documented under my assessment and plan.    [facility] [address] [place] [address] Dept: [phone] Dept Fax: [phone]   No orders of the defined types were placed in this encounter.     CHIEF COMPLAINT:  CC: A 88 y.o. female with history of breast cancer here for follow up evaluation  Current Treatment:  Surveillance  INTERVAL HISTORY:  Nya is here today for repeat clinical assessment. We stopped the letrozole at her last visit due to severe generalized arthralgias. She continues to have these arthralgias which we suspect is due to her arthritis. We will remain off of  the letrozole. This is reasonable since this is palliative in nature and we want her to have quality of life at age 16. If her cancer should recur/progress, we could consider Faslodex injections. She denies any new lumps, bumps, or knots. She denies signs of infection such as sore throat, sinus drainage, cough, or urinary symptoms.  She denies fevers or recurrent chills. She denies pain.  She denies nausea, vomiting, chest pain, dyspnea or cough. Her weight has increased 4lbs pounds over last 3 months .      I have reviewed the past medical history, past surgical history, social history and family history with the patient and they are unchanged from previous note.  ALLERGIES:  is allergic to iodinated contrast media, ticagrelor, and statins.  MEDICATIONS:  Current Outpatient Medications  Medication Sig Dispense Refill   metoprolol tartrate (LOPRESSOR) 25 MG tablet Take 25 mg by mouth 2 (two) times daily.     aspirin EC 81 MG EC tablet Take 1 tablet (81 mg total) by mouth daily. Swallow whole. 30 tablet 11   clopidogrel (PLAVIX) 75 MG tablet Take 75 mg by mouth daily.     clotrimazole-betamethasone (LOTRISONE) cream APPLY TO RASH ON FEET TWICE DAILY. 30 g 0   fluticasone (FLONASE) 50 MCG/ACT nasal spray Place 2 sprays into both nostrils daily.     furosemide (LASIX) 20 MG tablet Take 1 tablet by mouth once daily 90 tablet 3   levothyroxine (SYNTHROID) 50 MCG tablet Take 50 mcg by mouth daily before breakfast.     LORazepam (ATIVAN) 0.5 MG tablet Take 0.5 mg by mouth 3 (three) times daily as needed.     MAGNESIUM CITRATE PO Take 400 mg by mouth in the morning, at noon, and at bedtime.     mupirocin ointment (BACTROBAN) 2 % Apply 1 application topically 3 (three) times daily.     nitroGLYCERIN (NITROSTAT) 0.4 MG SL tablet Place 0.4 mg under the tongue every 5 (five) minutes as needed for chest pain.     ondansetron (ZOFRAN ODT) 4 MG disintegrating tablet Take 1 tablet (4 mg total) by mouth every 8 (eight) hours as needed for nausea or vomiting. 20 tablet 0   pantoprazole (PROTONIX) 40 MG tablet Take by mouth.     promethazine (PHENERGAN) 12.5 MG tablet Take 12.5 mg by mouth 4 (four) times daily as needed for nausea/vomiting.     Red Yeast Rice 600 MG CAPS Take 600 mg by mouth in the morning and at bedtime.     spironolactone (ALDACTONE) 25 MG tablet Take 0.5 tablets (12.5 mg  total) by mouth daily. 30 tablet 0   Terbinafine HCl (LAMISIL AT SPRAY) 1 % SOLN Spray in between toes once daily at bedtime 125 mL 0   traMADol (ULTRAM) 50 MG tablet Take 50 mg by mouth every 6 (six) hours as needed for severe pain.     No current facility-administered medications for this visit.    HISTORY OF PRESENT ILLNESS:   Oncology History  Breast cancer ([place])  02/20/2016 Cancer Staging   Staging form: Breast, AJCC 8th Edition - Clinical stage from 02/20/2016: Stage IIA (cT2(2), cN1(sn), cM0, G2, ER+, PR+, HER2-) - Signed by Derwood Kaplan, MD on 10/04/2020 Histopathologic type: Infiltrating duct carcinoma, NOS Stage prefix: Initial diagnosis Method of lymph node assessment: Sentinel lymph node biopsy Nuclear grade: G2 Multigene prognostic tests performed: None Histologic grading system: 3 grade system Laterality: Right Tumor size (mm): 24 Multiple tumors: Yes Number of tumors: 2 Lymph-vascular invasion (  LVI): LVI not present (absent)/not identified Diagnostic confirmation: Positive histology Specimen type: Excision Staged by: Managing physician Menopausal status: Postmenopausal Ki-67 (%): 10 Stage used in treatment planning: Yes National guidelines used in treatment planning: Yes Type of national guideline used in treatment planning: NCCN Staging comments: Treated with lumpectomy and 2 years anastrazole, then developed new lesion on left   01/01/2020 Initial Diagnosis   Breast cancer ([place])   Malignant neoplasm of upper-outer quadrant of right female breast ([place])  01/01/2020 Initial Diagnosis   Malignant neoplasm of upper-outer quadrant of right female breast ([place])   09/16/2020 Cancer Staging   Staging form: Breast, AJCC 8th Edition - Clinical stage from 09/16/2020: Stage IA (cT1c, cN0, cM0, G2, ER+, PR+, HER2-) - Signed by Derwood Kaplan, MD on 10/04/2020 Histopathologic type: Infiltrating duct carcinoma, NOS Stage prefix: Initial diagnosis Method of lymph  node assessment: Clinical Multigene prognostic tests performed: None Histologic grading system: 3 grade system Laterality: Right Tumor size (mm): 15 Lymph-vascular invasion (LVI): LVI present/identified, NOS Diagnostic confirmation: Positive histology Specimen type: Excision Staged by: Managing physician Menopausal status: Postmenopausal Ki-67 (%): 15 Stage used in treatment planning: Yes National guidelines used in treatment planning: Yes Type of national guideline used in treatment planning: NCCN       REVIEW OF SYSTEMS:   Review of Systems  Constitutional:  Negative for appetite change, chills, fever and unexpected weight change.  HENT:  Negative.  Negative for lump/mass, mouth sores and sore throat.   Eyes: Negative.   Respiratory: Negative.  Negative for chest tightness, cough, hemoptysis, shortness of breath and wheezing.   Cardiovascular: Negative.  Negative for chest pain, leg swelling and palpitations.  Gastrointestinal: Negative.  Negative for abdominal distention, abdominal pain, blood in stool, constipation, diarrhea, nausea and vomiting.  Endocrine: Negative.   Genitourinary: Negative.  Negative for difficulty urinating, dysuria, frequency and hematuria.   Musculoskeletal:  Positive for arthralgias (arthritis). Negative for back pain, flank pain and gait problem.  Skin: Negative.   Neurological:  Negative for dizziness, extremity weakness, gait problem, headaches, light-headedness, numbness, seizures and speech difficulty.  Hematological: Negative.  Negative for adenopathy. Does not bruise/bleed easily.  Psychiatric/Behavioral: Negative.  Negative for depression and sleep disturbance. The patient is not nervous/anxious.     VITALS:  Blood pressure (!) 147/67, pulse 72, temperature 98.6 F (37 C), temperature source Oral, resp. rate 18, height _0  (1.626 m), weight 135 lb 9.6 oz (61.5 kg), SpO2 95 %.  Wt Readings from Last 3 Encounters:  12/19/21 135 lb 9.6 oz  (61.5 kg)  11/19/21 133 lb 6.4 oz (60.5 kg)  09/19/21 131 lb 12.8 oz (59.8 kg)    Body mass index is 23.28 kg/m.  Performance status (ECOG): 1 - Symptomatic but completely ambulatory  PHYSICAL EXAM:   Physical Exam Exam conducted with a chaperone present.  Constitutional:      General: She is not in acute distress.    Appearance: Normal appearance. She is normal weight.  HENT:     Head: Normocephalic and atraumatic.  Eyes:     General: No scleral icterus.    Extraocular Movements: Extraocular movements intact.     Conjunctiva/sclera: Conjunctivae normal.     Pupils: Pupils are equal, round, and reactive to light.  Cardiovascular:     Rate and Rhythm: Normal rate and regular rhythm.     Pulses: Normal pulses.     Heart sounds: Normal heart sounds. No murmur heard.    No friction rub. No gallop.  Pulmonary:     Effort: Pulmonary effort is normal. No respiratory distress.     Breath sounds: Normal breath sounds.  Chest:     Chest wall: No mass or tenderness.     Comments: Scarring which is well-healed in upper outer quadrant of the right breast. Large sternotomy scar in middle of her chest. No masses of either breast.  Small hernia at xiphoid process. Abdominal:     General: Bowel sounds are normal. There is no distension.     Palpations: Abdomen is soft. There is no hepatomegaly, splenomegaly or mass.     Tenderness: There is no abdominal tenderness.  Musculoskeletal:        General: Normal range of motion.     Cervical back: Normal range of motion and neck supple.     Right lower leg: No edema.     Left lower leg: No edema.  Lymphadenopathy:     Cervical: No cervical adenopathy.     Upper Body:     Right upper body: No supraclavicular, axillary or pectoral adenopathy.     Left upper body: No supraclavicular, axillary or pectoral adenopathy.  Skin:    General: Skin is warm and dry.  Neurological:     General: No focal deficit present.     Mental Status: She is  alert and oriented to person, place, and time. Mental status is at baseline.  Psychiatric:        Mood and Affect: Mood normal.        Behavior: Behavior normal.        Thought Content: Thought content normal.        Judgment: Judgment normal.      LABORATORY DATA:  I have reviewed the data as listed    Component Value Date/Time   NA 137 11/04/2021 0000   K 4.7 11/04/2021 0000   CL 105 11/04/2021 0000   CO2 28 (A) 11/04/2021 0000   GLUCOSE 89 10/25/2020 0959   GLUCOSE 162 (H) 02/28/2020 1054   BUN 27 (A) 11/04/2021 0000   CREATININE 1.3 (A) 11/04/2021 0000   CREATININE 1.29 (H) 10/25/2020 0959   CALCIUM 9.4 11/04/2021 0000   PROT 6.3 (L) 12/06/2019 0412   ALBUMIN 4.0 11/04/2021 0000   AST 32 11/04/2021 0000   ALT 15 11/04/2021 0000   ALKPHOS 45 11/04/2021 0000   BILITOT 0.6 12/06/2019 0412   GFRNONAA 44 (L) 02/28/2020 1054   GFRAA 40 (L) 01/02/2020 1522    No results found for: "SPEP", "UPEP"  Lab Results  Component Value Date   WBC 7.8 11/04/2021   NEUTROABS 4.99 11/04/2021   HGB 10.7 (A) 11/04/2021   HCT 33 (A) 11/04/2021   MCV 85 05/17/2020   PLT 302 11/04/2021      Chemistry      Component Value Date/Time   NA 137 11/04/2021 0000   K 4.7 11/04/2021 0000   CL 105 11/04/2021 0000   CO2 28 (A) 11/04/2021 0000   BUN 27 (A) 11/04/2021 0000   CREATININE 1.3 (A) 11/04/2021 0000   CREATININE 1.29 (H) 10/25/2020 0959   GLU 76 11/04/2021 0000      Component Value Date/Time   CALCIUM 9.4 11/04/2021 0000   ALKPHOS 45 11/04/2021 0000   AST 32 11/04/2021 0000   ALT 15 11/04/2021 0000   BILITOT 0.6 12/06/2019 0412       RADIOGRAPHIC STUDIES: I have personally reviewed the radiological images as listed and agreed with the findings in the report.  No results found.       I,Alexis Herring,acting as a scribe for Derwood Kaplan, MD.,have documented all relevant documentation on the behalf of Derwood Kaplan, MD,as directed by  Derwood Kaplan,  MD while in the presence of Derwood Kaplan, MD.  I have reviewed this report as typed by the medical scribe, and it is complete and accurate.  Eugene Gavia   12/19/21 4:51 PM

## 2021-12-26 DIAGNOSIS — J309 Allergic rhinitis, unspecified: Secondary | ICD-10-CM | POA: Diagnosis not present

## 2021-12-26 DIAGNOSIS — I5022 Chronic systolic (congestive) heart failure: Secondary | ICD-10-CM | POA: Diagnosis not present

## 2021-12-26 DIAGNOSIS — R600 Localized edema: Secondary | ICD-10-CM | POA: Diagnosis not present

## 2021-12-31 DIAGNOSIS — I2510 Atherosclerotic heart disease of native coronary artery without angina pectoris: Secondary | ICD-10-CM | POA: Diagnosis not present

## 2021-12-31 DIAGNOSIS — I255 Ischemic cardiomyopathy: Secondary | ICD-10-CM | POA: Diagnosis not present

## 2021-12-31 DIAGNOSIS — I340 Nonrheumatic mitral (valve) insufficiency: Secondary | ICD-10-CM | POA: Diagnosis not present

## 2021-12-31 DIAGNOSIS — Z9889 Other specified postprocedural states: Secondary | ICD-10-CM | POA: Diagnosis not present

## 2021-12-31 DIAGNOSIS — I5021 Acute systolic (congestive) heart failure: Secondary | ICD-10-CM | POA: Diagnosis not present

## 2021-12-31 DIAGNOSIS — I351 Nonrheumatic aortic (valve) insufficiency: Secondary | ICD-10-CM | POA: Diagnosis not present

## 2021-12-31 DIAGNOSIS — I420 Dilated cardiomyopathy: Secondary | ICD-10-CM | POA: Diagnosis not present

## 2022-01-06 DIAGNOSIS — I509 Heart failure, unspecified: Secondary | ICD-10-CM | POA: Diagnosis not present

## 2022-01-07 ENCOUNTER — Encounter: Payer: Self-pay | Admitting: Oncology

## 2022-01-19 ENCOUNTER — Ambulatory Visit: Payer: Medicare Other | Admitting: Podiatry

## 2022-01-19 DIAGNOSIS — M79609 Pain in unspecified limb: Secondary | ICD-10-CM | POA: Diagnosis not present

## 2022-01-19 DIAGNOSIS — I739 Peripheral vascular disease, unspecified: Secondary | ICD-10-CM

## 2022-01-19 DIAGNOSIS — B351 Tinea unguium: Secondary | ICD-10-CM

## 2022-01-19 NOTE — Progress Notes (Signed)
  Subjective:  Patient ID: Olivia [name], female    DOB: [birth year],  MRN: [id number]  Chief Complaint  Patient presents with   Nail Problem    Routine Foot Care     88 y.o. female presents with the above complaint. History confirmed with patient. Patient presenting with pain related to dystrophic thickened elongated nails. Patient is unable to trim own nails related to nail dystrophy and/or mobility issues. Patient does not have a history of T2DM. No ulcerations or lesions that the pt is aware of.   Objective:  Physical Exam: warm, good capillary refill nail exam onychomycosis of the toenails, onychomycosis of the fingernails, onycholysis, and dystrophic nails DP pulses palpable, PT pulses palpable, and protective sensation absent Left Foot:  Pain with palpation of nails due to elongation and dystrophic growth.  Right Foot: Pain with palpation of nails due to elongation and dystrophic growth.   Assessment:   1. Pain due to onychomycosis of nail   2. PAD (peripheral artery disease) ([place])      Plan:  Patient was evaluated and treated and all questions answered.  #Onychomycosis with pain  -Nails palliatively debrided as below. -Educated on self-care  Procedure: Nail Debridement Rationale: Pain Type of Debridement: manual, sharp debridement. Instrumentation: Nail nipper, rotary burr. Number of Nails: 10  Return in about 3 months (around 04/20/2022) for RFC.         Everitt Amber, DPM [facility]

## 2022-01-29 DIAGNOSIS — I517 Cardiomegaly: Secondary | ICD-10-CM | POA: Diagnosis not present

## 2022-01-29 DIAGNOSIS — I5021 Acute systolic (congestive) heart failure: Secondary | ICD-10-CM | POA: Diagnosis not present

## 2022-01-29 DIAGNOSIS — I420 Dilated cardiomyopathy: Secondary | ICD-10-CM | POA: Diagnosis not present

## 2022-01-29 DIAGNOSIS — I255 Ischemic cardiomyopathy: Secondary | ICD-10-CM | POA: Diagnosis not present

## 2022-02-06 DIAGNOSIS — I509 Heart failure, unspecified: Secondary | ICD-10-CM | POA: Diagnosis not present

## 2022-02-11 DIAGNOSIS — D62 Acute posthemorrhagic anemia: Secondary | ICD-10-CM | POA: Diagnosis not present

## 2022-02-11 DIAGNOSIS — I5022 Chronic systolic (congestive) heart failure: Secondary | ICD-10-CM | POA: Diagnosis not present

## 2022-02-11 DIAGNOSIS — Z955 Presence of coronary angioplasty implant and graft: Secondary | ICD-10-CM | POA: Diagnosis not present

## 2022-02-11 DIAGNOSIS — E785 Hyperlipidemia, unspecified: Secondary | ICD-10-CM | POA: Diagnosis not present

## 2022-02-11 DIAGNOSIS — E039 Hypothyroidism, unspecified: Secondary | ICD-10-CM | POA: Diagnosis not present

## 2022-02-11 DIAGNOSIS — I2510 Atherosclerotic heart disease of native coronary artery without angina pectoris: Secondary | ICD-10-CM | POA: Diagnosis not present

## 2022-02-11 DIAGNOSIS — I63411 Cerebral infarction due to embolism of right middle cerebral artery: Secondary | ICD-10-CM | POA: Diagnosis not present

## 2022-03-02 DIAGNOSIS — N1832 Chronic kidney disease, stage 3b: Secondary | ICD-10-CM | POA: Diagnosis not present

## 2022-03-05 DIAGNOSIS — Z961 Presence of intraocular lens: Secondary | ICD-10-CM | POA: Diagnosis not present

## 2022-03-05 DIAGNOSIS — H52223 Regular astigmatism, bilateral: Secondary | ICD-10-CM | POA: Diagnosis not present

## 2022-03-09 DIAGNOSIS — I509 Heart failure, unspecified: Secondary | ICD-10-CM | POA: Diagnosis not present

## 2022-03-10 DIAGNOSIS — C792 Secondary malignant neoplasm of skin: Secondary | ICD-10-CM | POA: Diagnosis not present

## 2022-03-10 DIAGNOSIS — C50911 Malignant neoplasm of unspecified site of right female breast: Secondary | ICD-10-CM | POA: Diagnosis not present

## 2022-03-26 DIAGNOSIS — Z170 Estrogen receptor positive status [ER+]: Secondary | ICD-10-CM | POA: Diagnosis not present

## 2022-03-26 DIAGNOSIS — C50911 Malignant neoplasm of unspecified site of right female breast: Secondary | ICD-10-CM | POA: Diagnosis not present

## 2022-03-26 DIAGNOSIS — Z853 Personal history of malignant neoplasm of breast: Secondary | ICD-10-CM | POA: Diagnosis not present

## 2022-03-26 DIAGNOSIS — C792 Secondary malignant neoplasm of skin: Secondary | ICD-10-CM | POA: Diagnosis not present

## 2022-03-26 DIAGNOSIS — I252 Old myocardial infarction: Secondary | ICD-10-CM | POA: Diagnosis not present

## 2022-03-26 DIAGNOSIS — I10 Essential (primary) hypertension: Secondary | ICD-10-CM | POA: Diagnosis not present

## 2022-04-07 DIAGNOSIS — I509 Heart failure, unspecified: Secondary | ICD-10-CM | POA: Diagnosis not present

## 2022-04-27 ENCOUNTER — Ambulatory Visit: Payer: Medicare Other | Admitting: Podiatry

## 2022-04-28 ENCOUNTER — Ambulatory Visit: Payer: Medicare Other | Admitting: Podiatry

## 2022-04-28 DIAGNOSIS — L84 Corns and callosities: Secondary | ICD-10-CM | POA: Diagnosis not present

## 2022-04-28 DIAGNOSIS — B351 Tinea unguium: Secondary | ICD-10-CM

## 2022-04-28 DIAGNOSIS — M79609 Pain in unspecified limb: Secondary | ICD-10-CM

## 2022-04-28 DIAGNOSIS — I739 Peripheral vascular disease, unspecified: Secondary | ICD-10-CM

## 2022-04-28 NOTE — Progress Notes (Signed)
  Subjective:  Patient ID: Olivia [name], female    DOB: 31-Aug-1929,  MRN: [id number]  Chief Complaint  Patient presents with   Nail Problem    Nail trim     89 y.o. female presents with the above complaint. History confirmed with patient. Patient presenting with pain related to dystrophic thickened elongated nails. Patient is unable to trim own nails related to nail dystrophy and/or mobility issues. Patient does not have a history of T2DM. Pt does have a callus lesion a the bottom of the right heel causing pain.  Objective:  Physical Exam: warm, good capillary refill nail exam onychomycosis of the toenails, onychomycosis of the fingernails, onycholysis, and dystrophic nails DP pulses non palpable, PT pulses non palpable, and protective sensation absent Left Foot:  Pain with palpation of nails due to elongation and dystrophic growth.  Right Foot: Pain with palpation of nails due to elongation and dystrophic growth. Hyperkeratotic lesion representing pre ulcerative lesion central plantar heel with pain on palpation.   Assessment:   1. Pre-ulcerative calluses   2. Pain due to onychomycosis of nail   3. PAD (peripheral artery disease) ([place])       Plan:  Patient was evaluated and treated and all questions answered.  #Pre ulcerative callus plantar right heel All symptomatic hyperkeratoses x1 were safely debrided with a sterile #15 blade to patient's level of comfort without incident. We discussed preventative and palliative care of these lesions including supportive and accommodative shoegear, padding, prefabricated and custom molded accommodative orthoses, use of a pumice stone and lotions/creams daily.   #Onychomycosis with pain  -Nails palliatively debrided as below. -Educated on self-care  Procedure: Nail Debridement Rationale: Pain Type of Debridement: manual, sharp debridement. Instrumentation: Nail nipper, rotary burr. Number of Nails: 10  Return in about 3 months  (around 07/29/2022) for RFC.         Everitt Amber, DPM [facility] [place] [facility]

## 2022-05-08 DIAGNOSIS — I509 Heart failure, unspecified: Secondary | ICD-10-CM | POA: Diagnosis not present

## 2022-05-15 ENCOUNTER — Encounter: Payer: Self-pay | Admitting: Oncology

## 2022-05-15 NOTE — Addendum Note (Signed)
Addended by: Domenic Schwab on: 05/15/2022 03:26 PM   Modules accepted: Orders

## 2022-05-19 ENCOUNTER — Other Ambulatory Visit: Payer: Medicare Other

## 2022-05-19 ENCOUNTER — Ambulatory Visit: Payer: Medicare Other | Admitting: Oncology

## 2022-05-21 ENCOUNTER — Ambulatory Visit: Payer: Medicare Other

## 2022-05-21 NOTE — Progress Notes (Signed)
Patient Care Team: Paulina Fusi, MD as PCP - General (Internal Medicine) Thomasene Ripple, DO as PCP - Cardiology (Cardiology)  Clinic Day: 05/22/22  Referring physician: Paulina Fusi, MD  ASSESSMENT & PLAN:  Assessment:  Breast cancer [facility]) Stage IIB hormone receptor positive right breast cancer, January 2018 treated with lumpectomy and adjuvant hormonal therapy for about 3 years.      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  She underwent excisional biopsy with Dr. Lequita Halt.  HER2 was equivocal 2+, but negative by FISH.  Estrogen receptor was positive at 100% and progesterone receptor was positive at 5%.  Ki67 was 15%.  We have had several discussions regarding risks and benefits of hormonal therapy for this frail elderly woman with severe osteoporosis. We decided not to pursue adjuvant hormonal therapy. The margins were clear, but she was at high risk for recurrence due to the perineural and lymphovascular invasion.    Age-related osteoporosis without current pathological fracture Osteoporosis.  She is now on Prolia every 6 months and is due today. Calcium and vitamin D have been discontinued due to significant renal lithiasis.  Bone density from December 2022 remains stable. Her next Prolia is due Monday.   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 The estrogen receptors are positive at 100%, PR is negative, HER 2 is negative and Ki-67 is 10%. We are now forced to place her on hormonal therapy to treat this but fortunately she has been on Prolia for nearly 2 years. We have stopped the  Letrozole in October, 2023 at her request but she should do well and will need only surveillance. We could consider low dose Faslodex if she has recurrence disease. I don't feel any problems today.  Plan:  She has stopped Letrozole due to these severe arthralgias and we have decided to simply monitor with physical exam. I find no change in exam after 3 months off the Letrozole, and this is all palliative care anyway. She would prefer not to continue mammograms if not necessary, Dr. Lequita Halt has a mammogram scheduled for August and I will pass this along to him. I feel we can hold off on any therapy unless something palpable presents itself. At that point we can either resume an aromatase inhibitor or go to Faslodex injections. I explained to them that she can stay off of Letrozole; however, she will still experience arthritis pain. Her labs today are pending. She will have her Prolia injection on 05/25/2022. I will see her back in 3 months for reexamination. The patient and her daughter understand the plans discussed today and are in agreement with them.  She knows to contact our office if she develops concerns prior to her next appointment.  I provided 13 minutes of face-to-face time during this encounter and > 50% was spent counseling as documented under my assessment and plan.    Dellia Beckwith, MD  [facility] [address] [place] [address] Dept: [phone] Dept Fax: [phone]   No orders of the defined types were placed in this encounter.  CHIEF COMPLAINT:  CC: A 89 y.o. female with history of breast cancer here for follow up evaluation  Current Treatment:  Surveillance  INTERVAL HISTORY:  Olivia [name] is here today for repeat clinical assessment for her history of breast cancer. Patient states that she feels ok and complains of aches and pains in her shoulders, back, hips, and knees. She has stopped Letrozole  last time due to these severe arthralgias and we have decided to simply monitor with physical exam. She would prefer not to continue mammograms if not necessary, Dr. Lequita Halt has a mammogram scheduled for August and I will pass this along to him. I feel we can hold off on any therapy unless something palpable presents itself. At that point we can either resume an aromatase inhibitor or go to Faslodex injections. I explained to them that she can stay off of Letrozole; however, she will still experience arthritis pain. Her labs today are pending. She will have her Prolia injection on 05/25/2022. I will see her back in 3 months for reexamination. She denies signs of infection such as sore throat, sinus drainage, cough, or urinary symptoms.  She denies fevers or recurrent chills. She denies pain. She denies nausea, vomiting, chest pain, dyspnea or cough. Her appetite is good and her weight has decreased 1 pounds over last 5.5  months . She is accompanied at today's visit with her daughter.   I have reviewed the past medical history, past surgical history, social history and family history with the patient and they are unchanged from previous note.  ALLERGIES:  is allergic to iodinated contrast media, ticagrelor, and statins.  MEDICATIONS:  Current Outpatient Medications  Medication Sig Dispense Refill   aspirin EC 81 MG EC tablet Take 1 tablet (81 mg total) by mouth daily. Swallow whole. 30 tablet 11   clopidogrel (PLAVIX) 75 MG tablet Take 75 mg by mouth daily.     clotrimazole-betamethasone (LOTRISONE) cream APPLY TO RASH ON FEET TWICE DAILY. 30 g 0   fluticasone (FLONASE) 50 MCG/ACT nasal spray Place 2 sprays into both nostrils daily.     furosemide (LASIX) 20 MG tablet Take 1 tablet by mouth once daily 90 tablet 3   levothyroxine (SYNTHROID) 50 MCG tablet Take 50 mcg by mouth daily before breakfast.     LORazepam (ATIVAN) 0.5 MG tablet Take 0.5 mg by mouth 3 (three) times daily as needed.     MAGNESIUM  CITRATE PO Take 400 mg by mouth in the morning, at noon, and at bedtime.     metoprolol tartrate (LOPRESSOR) 25 MG tablet Take 25 mg by mouth 2 (two) times daily.     mupirocin ointment (BACTROBAN) 2 % Apply 1 application topically 3 (three) times daily.     nitroGLYCERIN (NITROSTAT) 0.4 MG SL tablet Place 0.4 mg under the tongue every 5 (five) minutes as needed for chest pain.     ondansetron (ZOFRAN ODT) 4 MG disintegrating tablet Take 1 tablet (4 mg total) by mouth every 8 (eight) hours as needed for nausea or vomiting. 20 tablet 0   pantoprazole (PROTONIX) 40 MG tablet Take by mouth.     promethazine (PHENERGAN) 12.5 MG tablet Take 12.5 mg by mouth 4 (four) times daily as needed for nausea/vomiting.     Red Yeast Rice 600 MG CAPS Take 600 mg by mouth in the morning and at bedtime.     spironolactone (ALDACTONE) 25 MG tablet Take 0.5 tablets (12.5 mg total) by mouth daily. 30 tablet 0   Terbinafine  HCl (LAMISIL AT SPRAY) 1 % SOLN Spray in between toes once daily at bedtime 125 mL 0   traMADol (ULTRAM) 50 MG tablet Take 50 mg by mouth every 6 (six) hours as needed for severe pain.     No current facility-administered medications for this visit.    HISTORY OF PRESENT ILLNESS:   Oncology History  Breast cancer (HCC)  02/20/2016 Cancer Staging   Staging form: Breast, AJCC 8th Edition - Clinical stage from 02/20/2016: Stage IIA (cT2(2), cN1(sn), cM0, G2, ER+, PR+, HER2-) - Signed by Dellia Beckwith, MD on 10/04/2020 Histopathologic type: Infiltrating duct carcinoma, NOS Stage prefix: Initial diagnosis Method of lymph node assessment: Sentinel lymph node biopsy Nuclear grade: G2 Multigene prognostic tests performed: None Histologic grading system: 3 grade system Laterality: Right Tumor size (mm): 24 Multiple tumors: Yes Number of tumors: 2 Lymph-vascular invasion (LVI): LVI not present (absent)/not identified Diagnostic confirmation: Positive histology Specimen type: Excision Staged  by: Managing physician Menopausal status: Postmenopausal Ki-67 (%): 10 Stage used in treatment planning: Yes National guidelines used in treatment planning: Yes Type of national guideline used in treatment planning: NCCN Staging comments: Treated with lumpectomy and 2 years anastrazole, then developed new lesion on left   01/01/2020 Initial Diagnosis   Breast cancer (HCC)   Malignant neoplasm of upper-outer quadrant of right female breast (HCC)  01/01/2020 Initial Diagnosis   Malignant neoplasm of upper-outer quadrant of right female breast (HCC)   09/16/2020 Cancer Staging   Staging form: Breast, AJCC 8th Edition - Clinical stage from 09/16/2020: Stage IA (cT1c, cN0, cM0, G2, ER+, PR+, HER2-) - Signed by Dellia Beckwith, MD on 10/04/2020 Histopathologic type: Infiltrating duct carcinoma, NOS Stage prefix: Initial diagnosis Method of lymph node assessment: Clinical Multigene prognostic tests performed: None Histologic grading system: 3 grade system Laterality: Right Tumor size (mm): 15 Lymph-vascular invasion (LVI): LVI present/identified, NOS Diagnostic confirmation: Positive histology Specimen type: Excision Staged by: Managing physician Menopausal status: Postmenopausal Ki-67 (%): 15 Stage used in treatment planning: Yes National guidelines used in treatment planning: Yes Type of national guideline used in treatment planning: NCCN     REVIEW OF SYSTEMS:   Review of Systems  Constitutional: Negative.  Negative for appetite change, chills, diaphoresis, fatigue, fever and unexpected weight change.  HENT:  Negative.  Negative for hearing loss, lump/mass, mouth sores, nosebleeds, sore throat, tinnitus, trouble swallowing and voice change.   Eyes: Negative.  Negative for eye problems and icterus.  Respiratory: Negative.  Negative for chest tightness, cough, hemoptysis, shortness of breath and wheezing.   Cardiovascular: Negative.  Negative for chest pain, leg swelling and  palpitations.  Gastrointestinal: Negative.  Negative for abdominal distention, abdominal pain, blood in stool, constipation, diarrhea, nausea, rectal pain and vomiting.  Endocrine: Negative.   Genitourinary: Negative.  Negative for bladder incontinence, difficulty urinating, dyspareunia, dysuria, frequency, hematuria, menstrual problem, nocturia, pelvic pain, vaginal bleeding and vaginal discharge.   Musculoskeletal:  Positive for arthralgias (arthritis). Negative for back pain, flank pain, gait problem, myalgias, neck pain and neck stiffness.  Skin: Negative.  Negative for itching, rash and wound.  Neurological:  Negative for dizziness, extremity weakness, gait problem, headaches, light-headedness, numbness, seizures and speech difficulty.  Hematological: Negative.  Negative for adenopathy. Does not bruise/bleed easily.  Psychiatric/Behavioral: Negative.  Negative for confusion, decreased concentration, depression, sleep disturbance and suicidal ideas. The patient is not nervous/anxious.     VITALS:  Blood pressure (!) 150/55, pulse 74, temperature 97.7 F (36.5 C), temperature source Oral, resp. rate  18, height 5\' 4"  (1.626 m), weight 134 lb 11.2 oz (61.1 kg), SpO2 97 %.  Wt Readings from Last 3 Encounters:  05/25/22 134 lb (60.8 kg)  05/22/22 134 lb 11.2 oz (61.1 kg)  12/19/21 135 lb 9.6 oz (61.5 kg)    Body mass index is 23.12 kg/m.  Performance status (ECOG): 1 - Symptomatic but completely ambulatory  PHYSICAL EXAM:   Physical Exam Vitals and nursing note reviewed. Exam conducted with a chaperone present.  Constitutional:      General: She is not in acute distress.    Appearance: Normal appearance. She is normal weight. She is not ill-appearing, toxic-appearing or diaphoretic.  HENT:     Head: Normocephalic and atraumatic.     Right Ear: Tympanic membrane, ear canal and external ear normal. There is no impacted cerumen.     Left Ear: Tympanic membrane, ear canal and external  ear normal. There is no impacted cerumen.     Nose: Nose normal. No congestion or rhinorrhea.     Mouth/Throat:     Mouth: Mucous membranes are moist.     Pharynx: Oropharynx is clear. No oropharyngeal exudate or posterior oropharyngeal erythema.  Eyes:     General: No scleral icterus.       Right eye: No discharge.        Left eye: No discharge.     Extraocular Movements: Extraocular movements intact.     Conjunctiva/sclera: Conjunctivae normal.     Pupils: Pupils are equal, round, and reactive to light.  Neck:     Vascular: No carotid bruit.  Cardiovascular:     Rate and Rhythm: Normal rate and regular rhythm.     Pulses: Normal pulses.     Heart sounds: Normal heart sounds. No murmur heard.    No friction rub. No gallop.  Pulmonary:     Effort: Pulmonary effort is normal. No respiratory distress.     Breath sounds: Normal breath sounds. No stridor. No wheezing, rhonchi or rales.  Chest:     Chest wall: No mass or tenderness.     Comments: Well-healed in upper outer quadrant of the right breast. No masses of either breast. Small hernia at xiphoid process. Large sternotomy scar in middle of her chest.  Abdominal:     General: Bowel sounds are normal. There is no distension.     Palpations: Abdomen is soft. There is no hepatomegaly, splenomegaly or mass.     Tenderness: There is no abdominal tenderness. There is no right CVA tenderness, left CVA tenderness, guarding or rebound.     Hernia: No hernia is present.  Musculoskeletal:        General: No swelling, tenderness, deformity or signs of injury. Normal range of motion.     Cervical back: Normal range of motion and neck supple. No rigidity or tenderness.     Right lower leg: No edema.     Left lower leg: No edema.  Lymphadenopathy:     Cervical: No cervical adenopathy.     Upper Body:     Right upper body: No supraclavicular, axillary or pectoral adenopathy.     Left upper body: No supraclavicular, axillary or pectoral  adenopathy.  Skin:    General: Skin is warm and dry.     Coloration: Skin is not jaundiced or pale.     Findings: No bruising, erythema, lesion or rash.  Neurological:     General: No focal deficit present.     Mental Status: She is  alert and oriented to person, place, and time. Mental status is at baseline.     Cranial Nerves: No cranial nerve deficit.     Sensory: No sensory deficit.     Motor: No weakness.     Coordination: Coordination normal.     Gait: Gait normal.     Deep Tendon Reflexes: Reflexes normal.  Psychiatric:        Mood and Affect: Mood normal.        Behavior: Behavior normal.        Thought Content: Thought content normal.        Judgment: Judgment normal.      LABORATORY DATA:  I have reviewed the data as listed Component Ref Range & Units 01/29/2022  WBC 4.4 - 11.0 x 10*3/uL 6.9  RBC 4.10 - 5.10 x 10*6/uL 4.00 Low   Hemoglobin 12.3 - 15.3 G/DL 16.1 Low   Hematocrit 09.6 - 44.6 % 35.5 Low   MCV 80.0 - 96.0 FL 88.8  MCH 27.5 - 33.2 PG 28.8  MCHC 33.0 - 37.0 G/DL 04.5 Low   RDW 40.9 - 81.1 % 15.9  Platelets 150 - 450 X 10*3/uL 330   Component Ref Range & Units 03/02/2022  Sodium 135 - 146 MMOL/L 139  Potassium 3.5 - 5.3 MMOL/L 4.5  Chloride 98 - 110 MMOL/L 100  CO2 23 - 30 MMOL/L 32 High   BUN 8 - 24 MG/DL 28 High   Glucose 70 - 99 MG/DL 80  Creatinine 9.14 - 7.82 MG/DL 9.56  Calcium 8.5 - 21.3 MG/DL 9.4  Total Protein 6.0 - 8.3 G/DL 7.0  Albumin 3.5 - 5.0 G/DL 3.8  Total Bilirubin 0.1 - 1.2 MG/DL 0.4  Alkaline Phosphatase 25 - 125 IU/L or U/L 49  AST (SGOT) 5 - 40 IU/L or U/L 23  ALT (SGPT) 5 - 50 IU/L or U/L 14      Component Value Date/Time   NA 139 05/22/2022 1344   NA 137 11/04/2021 0000   K 4.5 05/22/2022 1344   CL 101 05/22/2022 1344   CO2 26 05/22/2022 1344   GLUCOSE 85 05/22/2022 1344   BUN 24 (H) 05/22/2022 1344   BUN 27 (A) 11/04/2021 0000   CREATININE 1.32 (H) 05/22/2022 1344   CALCIUM 8.8 (L)  05/22/2022 1344   PROT 6.7 05/22/2022 1344   ALBUMIN 3.6 05/22/2022 1344   AST 30 05/22/2022 1344   ALT 15 05/22/2022 1344   ALKPHOS 38 05/22/2022 1344   BILITOT 0.4 05/22/2022 1344   GFRNONAA 38 (L) 05/22/2022 1344   GFRAA 40 (L) 01/02/2020 1522    No results found for: "SPEP", "UPEP"  Lab Results  Component Value Date   WBC 6.6 05/22/2022   NEUTROABS 4.0 05/22/2022   HGB 11.4 (L) 05/22/2022   HCT 37.2 05/22/2022   MCV 96.1 05/22/2022   PLT 233 05/22/2022      Chemistry      Component Value Date/Time   NA 139 05/22/2022 1344   NA 137 11/04/2021 0000   K 4.5 05/22/2022 1344   CL 101 05/22/2022 1344   CO2 26 05/22/2022 1344   BUN 24 (H) 05/22/2022 1344   BUN 27 (A) 11/04/2021 0000   CREATININE 1.32 (H) 05/22/2022 1344   GLU 76 11/04/2021 0000      Component Value Date/Time   CALCIUM 8.8 (L) 05/22/2022 1344   ALKPHOS 38 05/22/2022 1344   AST 30 05/22/2022 1344   ALT 15 05/22/2022 1344   BILITOT  0.4 05/22/2022 1344       RADIOGRAPHIC STUDIES: I have personally reviewed the radiological images as listed and agreed with the findings in the report. No results found.    I,Jasmine M Lassiter,acting as a scribe for Dellia Beckwith, MD.,have documented all relevant documentation on the behalf of Dellia Beckwith, MD,as directed by  Dellia Beckwith, MD while in the presence of Dellia Beckwith, MD.   I have reviewed this report as typed by the medical scribe, and it is complete and accurate.  Dellia Beckwith   06/05/22 7:03 AM

## 2022-05-22 ENCOUNTER — Inpatient Hospital Stay: Payer: Medicare Other | Admitting: Oncology

## 2022-05-22 ENCOUNTER — Inpatient Hospital Stay: Payer: Medicare Other | Attending: Oncology

## 2022-05-22 ENCOUNTER — Encounter: Payer: Self-pay | Admitting: Oncology

## 2022-05-22 VITALS — BP 150/55 | HR 74 | Temp 97.7°F | Resp 18 | Ht 64.0 in | Wt 134.7 lb

## 2022-05-22 DIAGNOSIS — C792 Secondary malignant neoplasm of skin: Secondary | ICD-10-CM | POA: Insufficient documentation

## 2022-05-22 DIAGNOSIS — M810 Age-related osteoporosis without current pathological fracture: Secondary | ICD-10-CM | POA: Diagnosis not present

## 2022-05-22 DIAGNOSIS — Z170 Estrogen receptor positive status [ER+]: Secondary | ICD-10-CM | POA: Insufficient documentation

## 2022-05-22 DIAGNOSIS — C50411 Malignant neoplasm of upper-outer quadrant of right female breast: Secondary | ICD-10-CM

## 2022-05-22 DIAGNOSIS — C44501 Unspecified malignant neoplasm of skin of breast: Secondary | ICD-10-CM | POA: Diagnosis not present

## 2022-05-22 DIAGNOSIS — Z79899 Other long term (current) drug therapy: Secondary | ICD-10-CM | POA: Insufficient documentation

## 2022-05-22 LAB — CBC WITH DIFFERENTIAL (CANCER CENTER ONLY)
Abs Immature Granulocytes: 0.01 10*3/uL (ref 0.00–0.07)
Basophils Absolute: 0.1 10*3/uL (ref 0.0–0.1)
Basophils Relative: 1 %
Eosinophils Absolute: 0.3 10*3/uL (ref 0.0–0.5)
Eosinophils Relative: 4 %
HCT: 37.2 % (ref 36.0–46.0)
Hemoglobin: 11.4 g/dL — ABNORMAL LOW (ref 12.0–15.0)
Immature Granulocytes: 0 %
Lymphocytes Relative: 21 %
Lymphs Abs: 1.4 10*3/uL (ref 0.7–4.0)
MCH: 29.5 pg (ref 26.0–34.0)
MCHC: 30.6 g/dL (ref 30.0–36.0)
MCV: 96.1 fL (ref 80.0–100.0)
Monocytes Absolute: 0.9 10*3/uL (ref 0.1–1.0)
Monocytes Relative: 13 %
Neutro Abs: 4 10*3/uL (ref 1.7–7.7)
Neutrophils Relative %: 61 %
Platelet Count: 233 10*3/uL (ref 150–400)
RBC: 3.87 MIL/uL (ref 3.87–5.11)
RDW: 15.7 % — ABNORMAL HIGH (ref 11.5–15.5)
WBC Count: 6.6 10*3/uL (ref 4.0–10.5)
nRBC: 0 % (ref 0.0–0.2)

## 2022-05-22 LAB — CMP (CANCER CENTER ONLY)
ALT: 15 U/L (ref 0–44)
AST: 30 U/L (ref 15–41)
Albumin: 3.6 g/dL (ref 3.5–5.0)
Alkaline Phosphatase: 38 U/L (ref 38–126)
Anion gap: 12 (ref 5–15)
BUN: 24 mg/dL — ABNORMAL HIGH (ref 8–23)
CO2: 26 mmol/L (ref 22–32)
Calcium: 8.8 mg/dL — ABNORMAL LOW (ref 8.9–10.3)
Chloride: 101 mmol/L (ref 98–111)
Creatinine: 1.32 mg/dL — ABNORMAL HIGH (ref 0.44–1.00)
GFR, Estimated: 38 mL/min — ABNORMAL LOW (ref >60)
Glucose, Bld: 85 mg/dL (ref 70–99)
Potassium: 4.5 mmol/L (ref 3.5–5.1)
Sodium: 139 mmol/L (ref 135–145)
Total Bilirubin: 0.4 mg/dL (ref 0.3–1.2)
Total Protein: 6.7 g/dL (ref 6.5–8.1)

## 2022-05-25 ENCOUNTER — Inpatient Hospital Stay: Payer: Medicare Other

## 2022-05-25 ENCOUNTER — Telehealth: Payer: Self-pay

## 2022-05-25 VITALS — BP 151/64 | HR 78 | Resp 16 | Ht 64.0 in | Wt 134.0 lb

## 2022-05-25 DIAGNOSIS — M810 Age-related osteoporosis without current pathological fracture: Secondary | ICD-10-CM

## 2022-05-25 DIAGNOSIS — C792 Secondary malignant neoplasm of skin: Secondary | ICD-10-CM | POA: Diagnosis not present

## 2022-05-25 DIAGNOSIS — Z79899 Other long term (current) drug therapy: Secondary | ICD-10-CM | POA: Diagnosis not present

## 2022-05-25 DIAGNOSIS — Z170 Estrogen receptor positive status [ER+]: Secondary | ICD-10-CM | POA: Diagnosis not present

## 2022-05-25 DIAGNOSIS — C44501 Unspecified malignant neoplasm of skin of breast: Secondary | ICD-10-CM | POA: Diagnosis not present

## 2022-05-25 MED ORDER — DENOSUMAB 60 MG/ML ~~LOC~~ SOSY
60.0000 mg | PREFILLED_SYRINGE | Freq: Once | SUBCUTANEOUS | Status: AC
  Administered 2022-05-25: 60 mg via SUBCUTANEOUS
  Filled 2022-05-25: qty 1

## 2022-05-25 NOTE — Telephone Encounter (Signed)
Pam with Dr. Baker Pierini office notified.

## 2022-05-25 NOTE — Telephone Encounter (Signed)
-----   Message from Dellia Beckwith, MD sent at 05/22/2022  5:19 PM EDT ----- Regarding: call Tell Dr. Baker Pierini office that she prefers not to have the mammogram in August.  I will be examining her regularly, I explain it in my note

## 2022-06-02 ENCOUNTER — Telehealth: Payer: Self-pay

## 2022-06-02 NOTE — Telephone Encounter (Signed)
-----   Message from Dellia Beckwith, MD sent at 06/02/2022  2:43 PM EDT ----- Regarding: call daughter Tell her I am sorry to take so long to let her know about the labs but they are overall good. The kidneys are stable, still on the dry side, and her anemia is better (hgb up to 11.4 from 10.7) All the rest is normal.

## 2022-06-02 NOTE — Telephone Encounter (Signed)
Patient's daughter notified of lab results.

## 2022-06-05 ENCOUNTER — Encounter: Payer: Self-pay | Admitting: Oncology

## 2022-06-07 DIAGNOSIS — I509 Heart failure, unspecified: Secondary | ICD-10-CM | POA: Diagnosis not present

## 2022-06-17 DIAGNOSIS — Z789 Other specified health status: Secondary | ICD-10-CM | POA: Diagnosis not present

## 2022-06-17 DIAGNOSIS — I420 Dilated cardiomyopathy: Secondary | ICD-10-CM | POA: Diagnosis not present

## 2022-06-17 DIAGNOSIS — I255 Ischemic cardiomyopathy: Secondary | ICD-10-CM | POA: Diagnosis not present

## 2022-06-17 DIAGNOSIS — E782 Mixed hyperlipidemia: Secondary | ICD-10-CM | POA: Diagnosis not present

## 2022-06-17 DIAGNOSIS — Z9889 Other specified postprocedural states: Secondary | ICD-10-CM | POA: Diagnosis not present

## 2022-06-17 DIAGNOSIS — I2510 Atherosclerotic heart disease of native coronary artery without angina pectoris: Secondary | ICD-10-CM | POA: Diagnosis not present

## 2022-06-17 DIAGNOSIS — Z951 Presence of aortocoronary bypass graft: Secondary | ICD-10-CM | POA: Diagnosis not present

## 2022-06-17 DIAGNOSIS — I252 Old myocardial infarction: Secondary | ICD-10-CM | POA: Diagnosis not present

## 2022-06-17 DIAGNOSIS — I340 Nonrheumatic mitral (valve) insufficiency: Secondary | ICD-10-CM | POA: Diagnosis not present

## 2022-06-17 DIAGNOSIS — I10 Essential (primary) hypertension: Secondary | ICD-10-CM | POA: Diagnosis not present

## 2022-06-17 DIAGNOSIS — Z7982 Long term (current) use of aspirin: Secondary | ICD-10-CM | POA: Diagnosis not present

## 2022-06-17 DIAGNOSIS — I351 Nonrheumatic aortic (valve) insufficiency: Secondary | ICD-10-CM | POA: Diagnosis not present

## 2022-07-08 DIAGNOSIS — I509 Heart failure, unspecified: Secondary | ICD-10-CM | POA: Diagnosis not present

## 2022-07-25 DIAGNOSIS — Z7982 Long term (current) use of aspirin: Secondary | ICD-10-CM | POA: Diagnosis not present

## 2022-07-25 DIAGNOSIS — Z91041 Radiographic dye allergy status: Secondary | ICD-10-CM | POA: Diagnosis not present

## 2022-07-25 DIAGNOSIS — J90 Pleural effusion, not elsewhere classified: Secondary | ICD-10-CM | POA: Diagnosis not present

## 2022-07-25 DIAGNOSIS — I5023 Acute on chronic systolic (congestive) heart failure: Secondary | ICD-10-CM | POA: Diagnosis not present

## 2022-07-25 DIAGNOSIS — I2510 Atherosclerotic heart disease of native coronary artery without angina pectoris: Secondary | ICD-10-CM | POA: Diagnosis not present

## 2022-07-25 DIAGNOSIS — N179 Acute kidney failure, unspecified: Secondary | ICD-10-CM | POA: Diagnosis not present

## 2022-07-25 DIAGNOSIS — R042 Hemoptysis: Secondary | ICD-10-CM | POA: Diagnosis not present

## 2022-07-25 DIAGNOSIS — I110 Hypertensive heart disease with heart failure: Secondary | ICD-10-CM | POA: Diagnosis not present

## 2022-07-25 DIAGNOSIS — M1990 Unspecified osteoarthritis, unspecified site: Secondary | ICD-10-CM | POA: Diagnosis not present

## 2022-07-25 DIAGNOSIS — R059 Cough, unspecified: Secondary | ICD-10-CM | POA: Diagnosis not present

## 2022-07-25 DIAGNOSIS — Z87891 Personal history of nicotine dependence: Secondary | ICD-10-CM | POA: Diagnosis not present

## 2022-07-25 DIAGNOSIS — Z951 Presence of aortocoronary bypass graft: Secondary | ICD-10-CM | POA: Diagnosis not present

## 2022-07-25 DIAGNOSIS — I4891 Unspecified atrial fibrillation: Secondary | ICD-10-CM | POA: Diagnosis not present

## 2022-07-25 DIAGNOSIS — R0902 Hypoxemia: Secondary | ICD-10-CM | POA: Diagnosis not present

## 2022-07-25 DIAGNOSIS — I509 Heart failure, unspecified: Secondary | ICD-10-CM | POA: Diagnosis not present

## 2022-07-25 DIAGNOSIS — Z853 Personal history of malignant neoplasm of breast: Secondary | ICD-10-CM | POA: Diagnosis not present

## 2022-07-25 DIAGNOSIS — I252 Old myocardial infarction: Secondary | ICD-10-CM | POA: Diagnosis not present

## 2022-07-25 DIAGNOSIS — Z8673 Personal history of transient ischemic attack (TIA), and cerebral infarction without residual deficits: Secondary | ICD-10-CM | POA: Diagnosis not present

## 2022-07-25 DIAGNOSIS — J449 Chronic obstructive pulmonary disease, unspecified: Secondary | ICD-10-CM | POA: Diagnosis not present

## 2022-07-25 DIAGNOSIS — J9601 Acute respiratory failure with hypoxia: Secondary | ICD-10-CM | POA: Diagnosis not present

## 2022-07-25 DIAGNOSIS — R9431 Abnormal electrocardiogram [ECG] [EKG]: Secondary | ICD-10-CM | POA: Diagnosis not present

## 2022-07-25 DIAGNOSIS — Z79899 Other long term (current) drug therapy: Secondary | ICD-10-CM | POA: Diagnosis not present

## 2022-07-25 DIAGNOSIS — R918 Other nonspecific abnormal finding of lung field: Secondary | ICD-10-CM | POA: Diagnosis not present

## 2022-07-25 DIAGNOSIS — Z7902 Long term (current) use of antithrombotics/antiplatelets: Secondary | ICD-10-CM | POA: Diagnosis not present

## 2022-07-27 ENCOUNTER — Ambulatory Visit: Payer: Medicare Other | Admitting: Podiatry

## 2022-08-03 DIAGNOSIS — N179 Acute kidney failure, unspecified: Secondary | ICD-10-CM | POA: Diagnosis not present

## 2022-08-03 DIAGNOSIS — R042 Hemoptysis: Secondary | ICD-10-CM | POA: Diagnosis not present

## 2022-08-03 DIAGNOSIS — J9601 Acute respiratory failure with hypoxia: Secondary | ICD-10-CM | POA: Diagnosis not present

## 2022-08-03 DIAGNOSIS — I5023 Acute on chronic systolic (congestive) heart failure: Secondary | ICD-10-CM | POA: Diagnosis not present

## 2022-08-07 DIAGNOSIS — I509 Heart failure, unspecified: Secondary | ICD-10-CM | POA: Diagnosis not present

## 2022-08-12 DIAGNOSIS — E039 Hypothyroidism, unspecified: Secondary | ICD-10-CM | POA: Diagnosis not present

## 2022-08-12 DIAGNOSIS — I5022 Chronic systolic (congestive) heart failure: Secondary | ICD-10-CM | POA: Diagnosis not present

## 2022-08-12 DIAGNOSIS — D62 Acute posthemorrhagic anemia: Secondary | ICD-10-CM | POA: Diagnosis not present

## 2022-08-12 DIAGNOSIS — I2510 Atherosclerotic heart disease of native coronary artery without angina pectoris: Secondary | ICD-10-CM | POA: Diagnosis not present

## 2022-08-12 DIAGNOSIS — I63411 Cerebral infarction due to embolism of right middle cerebral artery: Secondary | ICD-10-CM | POA: Diagnosis not present

## 2022-08-12 DIAGNOSIS — Z955 Presence of coronary angioplasty implant and graft: Secondary | ICD-10-CM | POA: Diagnosis not present

## 2022-08-12 DIAGNOSIS — J069 Acute upper respiratory infection, unspecified: Secondary | ICD-10-CM | POA: Diagnosis not present

## 2022-08-12 DIAGNOSIS — E785 Hyperlipidemia, unspecified: Secondary | ICD-10-CM | POA: Diagnosis not present

## 2022-08-14 NOTE — Progress Notes (Signed)
Patient Care Team: Paulina Fusi, MD as PCP - General (Internal Medicine) Thomasene Ripple, DO as PCP - Cardiology (Cardiology)  Clinic Day: 08/21/22  Referring physician: Paulina Fusi, MD  ASSESSMENT & PLAN:  Assessment:  Breast cancer [facility]) Stage IIB hormone receptor positive right breast cancer, January 2018 treated with lumpectomy and adjuvant hormonal therapy for about 3 years.      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  She underwent excisional biopsy with Dr. Lequita Halt.  HER2 was equivocal 2+, but negative by FISH.  Estrogen receptor was positive at 100% and progesterone receptor was positive at 5%.  Ki67 was 15%.  We have had several discussions regarding risks and benefits of hormonal therapy for this frail elderly woman with severe osteoporosis. We decided not to pursue adjuvant hormonal therapy. The margins were clear, but she was at high risk for recurrence due to the perineural and lymphovascular invasion.    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 The estrogen receptors are positive at 100%, PR is negative, HER 2 is negative and Ki-67 is 10%. We are now forced to place her on hormonal therapy to treat this but fortunately she has been on Prolia for nearly 2 years. We have stopped the Letrozole in October, 2023 at her request due to arthralgias but she should do well and will need only surveillance. We could consider low dose Faslodex if she has recurrent disease. I don't feel any problems today.  Age-related osteoporosis without current pathological fracture Osteoporosis.   She is now on Prolia every 6 months and is due today. Calcium and vitamin D have been discontinued due to significant renal lithiasis.  Bone density from December 2022 remains stable. Her next Prolia is due Monday.    Plan:  She has stopped Letrozole due to these severe arthralgias and we have decided to simply monitor with physical exam. I find no change in exam after 6 months off the Letrozole. I will contact Dr. Lequita Halt to see if he has canceled her mammogram in August as she requested. She will be due for her next Prolia injection in October. Her PCP does routine labs. I will see her back in 4 months with CBC, CMP, and Prolia injection. The patient and her daughter understand the plans discussed today and are in agreement with them.  She knows to contact our office if she develops concerns prior to her next appointment.  I provided 13 minutes of face-to-face time during this encounter and > 50% was spent counseling as documented under my assessment and plan.    Dellia Beckwith, MD  [facility] AT [facility] [address] [place] [address] Dept: [phone] Dept Fax: [phone]   No orders of the defined types were placed in this encounter.   CHIEF COMPLAINT:  CC: A 89 y.o. female with history of breast cancer here for follow up evaluation  Current Treatment:  Surveillance  INTERVAL HISTORY:  Olivia [name] is here today for repeat clinical assessment for her history of breast cancer. Patient states that she feels ok and complains of arthritis pain in  her shoulder, back, and knees. She continues to hold off on Letrozole. I will contact Dr. Lequita Halt to see if he has canceled her mammogram in August as she requested. She will be due for her next Prolia injection in October. Her PCP does routine labs. I will see her back in 4 months with CBC, CMP, and Prolia injection. She denies signs of infection such as sore throat, sinus drainage,  cough, or urinary symptoms.  She denies fevers or recurrent chills. She denies pain. She denies nausea, vomiting, chest pain, dyspnea or cough. Her appetite is good and her weight has decreased 10 pounds over last 3 months She is accompanied at today's visit by her daughter.   I have reviewed the past medical history, past surgical history, social history and family history with the patient and they are unchanged from previous note.  ALLERGIES:  is allergic to iodinated contrast media, ticagrelor, and statins.  MEDICATIONS:  Current Outpatient Medications  Medication Sig Dispense Refill   aspirin EC 81 MG EC tablet Take 1 tablet (81 mg total) by mouth daily. Swallow whole. 30 tablet 11   clopidogrel (PLAVIX) 75 MG tablet Take 75 mg by mouth daily.     clotrimazole-betamethasone (LOTRISONE) cream APPLY TO RASH ON FEET TWICE DAILY. 30 g 0   fluticasone (FLONASE) 50 MCG/ACT nasal spray Place 2 sprays into both nostrils daily.     furosemide (LASIX) 20 MG tablet Take 1 tablet by mouth once daily (Patient taking differently: Take 20 mg by mouth daily. 1 tablet two times a day twice a week.) 90 tablet 3   levothyroxine (SYNTHROID) 50 MCG tablet Take 50 mcg by mouth daily before breakfast.     LORazepam (ATIVAN) 0.5 MG tablet Take 0.5 mg by mouth 3 (three) times daily as needed.     MAGNESIUM CITRATE PO Take 400 mg by mouth in the morning, at noon, and at bedtime.     metoprolol tartrate (LOPRESSOR) 25 MG tablet Take 25 mg by mouth 2 (two) times daily.     mupirocin ointment (BACTROBAN) 2 % Apply 1 application topically 3 (three) times daily.     ondansetron (ZOFRAN ODT) 4 MG disintegrating tablet Take 1 tablet (4 mg total) by mouth every 8 (eight) hours as needed for nausea or vomiting. 20 tablet 0   pantoprazole (PROTONIX) 40 MG tablet Take by mouth.     promethazine (PHENERGAN) 12.5 MG tablet Take 12.5 mg by mouth 4 (four) times daily as needed for nausea/vomiting.     Red Yeast Rice 600 MG CAPS  Take 600 mg by mouth in the morning and at bedtime.     spironolactone (ALDACTONE) 25 MG tablet Take 0.5 tablets (12.5 mg total) by mouth daily. 30 tablet 0   Terbinafine HCl (LAMISIL AT SPRAY) 1 % SOLN Spray in between toes once daily at bedtime 125 mL 0   traMADol (ULTRAM) 50 MG tablet Take 50 mg by mouth every 6 (six) hours as needed for severe pain.     nitroGLYCERIN (NITROSTAT) 0.4 MG SL tablet Place 0.4 mg under the tongue every 5 (five) minutes as needed for chest pain.     No current facility-administered medications for this visit.    HISTORY OF PRESENT ILLNESS:   Oncology History  Breast cancer (HCC)  02/20/2016 Cancer Staging   Staging form: Breast, AJCC 8th Edition - Clinical stage from 02/20/2016: Stage IIA (cT2(2), cN1(sn), cM0, G2, ER+, PR+, HER2-) - Signed by Dellia Beckwith, MD on 10/04/2020 Histopathologic  type: Infiltrating duct carcinoma, NOS Stage prefix: Initial diagnosis Method of lymph node assessment: Sentinel lymph node biopsy Nuclear grade: G2 Multigene prognostic tests performed: None Histologic grading system: 3 grade system Laterality: Right Tumor size (mm): 24 Multiple tumors: Yes Number of tumors: 2 Lymph-vascular invasion (LVI): LVI not present (absent)/not identified Diagnostic confirmation: Positive histology Specimen type: Excision Staged by: Managing physician Menopausal status: Postmenopausal Ki-67 (%): 10 Stage used in treatment planning: Yes National guidelines used in treatment planning: Yes Type of national guideline used in treatment planning: NCCN Staging comments: Treated with lumpectomy and 2 years anastrazole, then developed new lesion on left   01/01/2020 Initial Diagnosis   Breast cancer (HCC)   Malignant neoplasm of upper-outer quadrant of right female breast (HCC)  01/01/2020 Initial Diagnosis   Malignant neoplasm of upper-outer quadrant of right female breast (HCC)   09/16/2020 Cancer Staging   Staging form: Breast, AJCC  8th Edition - Clinical stage from 09/16/2020: Stage IA (cT1c, cN0, cM0, G2, ER+, PR+, HER2-) - Signed by Dellia Beckwith, MD on 10/04/2020 Histopathologic type: Infiltrating duct carcinoma, NOS Stage prefix: Initial diagnosis Method of lymph node assessment: Clinical Multigene prognostic tests performed: None Histologic grading system: 3 grade system Laterality: Right Tumor size (mm): 15 Lymph-vascular invasion (LVI): LVI present/identified, NOS Diagnostic confirmation: Positive histology Specimen type: Excision Staged by: Managing physician Menopausal status: Postmenopausal Ki-67 (%): 15 Stage used in treatment planning: Yes National guidelines used in treatment planning: Yes Type of national guideline used in treatment planning: NCCN     REVIEW OF SYSTEMS:   Review of Systems  Constitutional: Negative.  Negative for appetite change, chills, diaphoresis, fatigue, fever and unexpected weight change.  HENT:  Negative.  Negative for hearing loss, lump/mass, mouth sores, nosebleeds, sore throat, tinnitus, trouble swallowing and voice change.   Eyes: Negative.  Negative for eye problems and icterus.  Respiratory: Negative.  Negative for chest tightness, cough, hemoptysis, shortness of breath and wheezing.   Cardiovascular: Negative.  Negative for chest pain, leg swelling and palpitations.  Gastrointestinal: Negative.  Negative for abdominal distention, abdominal pain, blood in stool, constipation, diarrhea, nausea, rectal pain and vomiting.  Endocrine: Negative.   Genitourinary: Negative.  Negative for bladder incontinence, difficulty urinating, dyspareunia, dysuria, frequency, hematuria, menstrual problem, nocturia, pelvic pain, vaginal bleeding and vaginal discharge.   Musculoskeletal:  Positive for arthralgias (arthritis pain in her shoulder, back, and knees.). Negative for back pain, flank pain, gait problem, myalgias, neck pain and neck stiffness.  Skin: Negative.  Negative for  itching, rash and wound.  Neurological:  Negative for dizziness, extremity weakness, gait problem, headaches, light-headedness, numbness, seizures and speech difficulty.  Hematological: Negative.  Negative for adenopathy. Does not bruise/bleed easily.  Psychiatric/Behavioral: Negative.  Negative for confusion, decreased concentration, depression, sleep disturbance and suicidal ideas. The patient is not nervous/anxious.     VITALS:  Blood pressure (!) 146/80, pulse 76, resp. rate 18, height 5\' 4"  (1.626 m), weight 124 lb 1.6 oz (56.3 kg), SpO2 99%.  Wt Readings from Last 3 Encounters:  08/21/22 124 lb 1.6 oz (56.3 kg)  05/25/22 134 lb (60.8 kg)  05/22/22 134 lb 11.2 oz (61.1 kg)    Body mass index is 21.3 kg/m.  Performance status (ECOG): 1 - Symptomatic but completely ambulatory  PHYSICAL EXAM:   Physical Exam Vitals and nursing note reviewed. Exam conducted with a chaperone present.  Constitutional:      General: She is not in acute distress.    Appearance: Normal appearance. She  is normal weight. She is not ill-appearing, toxic-appearing or diaphoretic.  HENT:     Head: Normocephalic and atraumatic.     Right Ear: Tympanic membrane, ear canal and external ear normal. There is no impacted cerumen.     Left Ear: Tympanic membrane, ear canal and external ear normal. There is no impacted cerumen.     Nose: Nose normal. No congestion or rhinorrhea.     Mouth/Throat:     Mouth: Mucous membranes are moist.     Pharynx: Oropharynx is clear. No oropharyngeal exudate or posterior oropharyngeal erythema.  Eyes:     General: No scleral icterus.       Right eye: No discharge.        Left eye: No discharge.     Extraocular Movements: Extraocular movements intact.     Conjunctiva/sclera: Conjunctivae normal.     Pupils: Pupils are equal, round, and reactive to light.  Neck:     Vascular: No carotid bruit.  Cardiovascular:     Rate and Rhythm: Normal rate and regular rhythm.     Pulses:  Normal pulses.     Heart sounds: Normal heart sounds. No murmur heard.    No friction rub. No gallop.  Pulmonary:     Effort: Pulmonary effort is normal. No respiratory distress.     Breath sounds: Normal breath sounds. No stridor. No wheezing, rhonchi or rales.  Chest:     Chest wall: No mass or tenderness.     Comments: No masses of either breast. Small hernia at epigastrium. Left breast has a scar in the lateral aspect at 3 o'clock just adjacent to the areolar complex. Right breast has a extensive scar in the upper outer quadrant which is well healed.  Abdominal:     General: Bowel sounds are normal. There is no distension.     Palpations: Abdomen is soft. There is no hepatomegaly, splenomegaly or mass.     Tenderness: There is no abdominal tenderness. There is no right CVA tenderness, left CVA tenderness, guarding or rebound.     Hernia: No hernia is present.  Musculoskeletal:        General: No swelling, tenderness, deformity or signs of injury. Normal range of motion.     Cervical back: Normal range of motion and neck supple. No rigidity or tenderness.     Right lower leg: No edema.     Left lower leg: No edema.     Comments: Kyphosis severe of the thoracic spine.   Lymphadenopathy:     Cervical: No cervical adenopathy.     Upper Body:     Right upper body: No supraclavicular, axillary or pectoral adenopathy.     Left upper body: No supraclavicular, axillary or pectoral adenopathy.  Skin:    General: Skin is warm and dry.     Coloration: Skin is not jaundiced or pale.     Findings: No bruising, erythema, lesion or rash.  Neurological:     General: No focal deficit present.     Mental Status: She is alert and oriented to person, place, and time. Mental status is at baseline.     Cranial Nerves: No cranial nerve deficit.     Sensory: No sensory deficit.     Motor: No weakness.     Coordination: Coordination normal.     Gait: Gait normal.     Deep Tendon Reflexes: Reflexes  normal.  Psychiatric:        Mood and Affect: Mood normal.  Behavior: Behavior normal.        Thought Content: Thought content normal.        Judgment: Judgment normal.      LABORATORY DATA:  I have reviewed the data as listed     Component Value Date/Time   NA 139 05/22/2022 1344   NA 137 11/04/2021 0000   K 4.5 05/22/2022 1344   CL 101 05/22/2022 1344   CO2 26 05/22/2022 1344   GLUCOSE 85 05/22/2022 1344   BUN 24 (H) 05/22/2022 1344   BUN 27 (A) 11/04/2021 0000   CREATININE 1.32 (H) 05/22/2022 1344   CALCIUM 8.8 (L) 05/22/2022 1344   PROT 6.7 05/22/2022 1344   ALBUMIN 3.6 05/22/2022 1344   AST 30 05/22/2022 1344   ALT 15 05/22/2022 1344   ALKPHOS 38 05/22/2022 1344   BILITOT 0.4 05/22/2022 1344   GFRNONAA 38 (L) 05/22/2022 1344   GFRAA 40 (L) 01/02/2020 1522   No results found for: "SPEP", "UPEP"  Lab Results  Component Value Date   WBC 6.6 05/22/2022   NEUTROABS 4.0 05/22/2022   HGB 11.4 (L) 05/22/2022   HCT 37.2 05/22/2022   MCV 96.1 05/22/2022   PLT 233 05/22/2022     Chemistry      Component Value Date/Time   NA 139 05/22/2022 1344   NA 137 11/04/2021 0000   K 4.5 05/22/2022 1344   CL 101 05/22/2022 1344   CO2 26 05/22/2022 1344   BUN 24 (H) 05/22/2022 1344   BUN 27 (A) 11/04/2021 0000   CREATININE 1.32 (H) 05/22/2022 1344   GLU 76 11/04/2021 0000      Component Value Date/Time   CALCIUM 8.8 (L) 05/22/2022 1344   ALKPHOS 38 05/22/2022 1344   AST 30 05/22/2022 1344   ALT 15 05/22/2022 1344   BILITOT 0.4 05/22/2022 1344       RADIOGRAPHIC STUDIES: I have personally reviewed the radiological images as listed and agreed with the findings in the report. No results found.     I,Jasmine M Lassiter,acting as a scribe for Dellia Beckwith, MD.,have documented all relevant documentation on the behalf of Dellia Beckwith, MD,as directed by  Dellia Beckwith, MD while in the presence of Dellia Beckwith, MD.  I have reviewed this  report as typed by the medical scribe, and it is complete and accurate.  Dellia Beckwith   09/06/22 7:40 AM

## 2022-08-21 ENCOUNTER — Inpatient Hospital Stay: Payer: Medicare Other | Attending: Oncology | Admitting: Oncology

## 2022-08-21 VITALS — BP 146/80 | HR 76 | Resp 18 | Ht 64.0 in | Wt 124.1 lb

## 2022-08-21 DIAGNOSIS — C792 Secondary malignant neoplasm of skin: Secondary | ICD-10-CM

## 2022-08-21 DIAGNOSIS — C50411 Malignant neoplasm of upper-outer quadrant of right female breast: Secondary | ICD-10-CM | POA: Diagnosis not present

## 2022-08-21 DIAGNOSIS — M810 Age-related osteoporosis without current pathological fracture: Secondary | ICD-10-CM

## 2022-08-21 DIAGNOSIS — Z170 Estrogen receptor positive status [ER+]: Secondary | ICD-10-CM | POA: Diagnosis not present

## 2022-09-06 ENCOUNTER — Encounter: Payer: Self-pay | Admitting: Oncology

## 2022-09-07 DIAGNOSIS — I509 Heart failure, unspecified: Secondary | ICD-10-CM | POA: Diagnosis not present

## 2022-09-14 ENCOUNTER — Ambulatory Visit: Payer: Medicare Other | Admitting: Podiatry

## 2022-09-14 DIAGNOSIS — I739 Peripheral vascular disease, unspecified: Secondary | ICD-10-CM | POA: Diagnosis not present

## 2022-09-14 DIAGNOSIS — B351 Tinea unguium: Secondary | ICD-10-CM

## 2022-09-14 DIAGNOSIS — L84 Corns and callosities: Secondary | ICD-10-CM | POA: Diagnosis not present

## 2022-09-14 DIAGNOSIS — M79609 Pain in unspecified limb: Secondary | ICD-10-CM | POA: Diagnosis not present

## 2022-09-14 NOTE — Progress Notes (Signed)
  Subjective:  Patient ID: Olivia [name], female    DOB: [birth year],  MRN: [id number]  Chief Complaint  Patient presents with   Nail Problem    Routine Foot Care-nail trim      89 y.o. female presents with the above complaint. History confirmed with patient. Patient presenting with pain related to dystrophic thickened elongated nails. Patient is unable to trim own nails related to nail dystrophy and/or mobility issues. Patient does not have a history of T2DM. Pt does have a callus lesion a the bottom of the right heel and distal hallux causing pain.  Objective:  Physical Exam: warm, good capillary refill nail exam onychomycosis of the toenails, onychomycosis of the fingernails, onycholysis, and dystrophic nails DP pulses non palpable, PT pulses non palpable, and protective sensation absent Left Foot:  Pain with palpation of nails due to elongation and dystrophic growth.  Right Foot: Pain with palpation of nails due to elongation and dystrophic growth. Hyperkeratotic lesion representing pre ulcerative lesion central plantar heel with pain on palpation. Also hyperkeratotic lesion on the right great toe.   Assessment:   1. Pre-ulcerative calluses   2. Pain due to onychomycosis of nail   3. PAD (peripheral artery disease) (HCC)        Plan:  Patient was evaluated and treated and all questions answered.  #Pre ulcerative callus plantar right heel and distal hallux of the right side All symptomatic hyperkeratoses x2 were safely debrided with a sterile #15 blade to patient's level of comfort without incident. We discussed preventative and palliative care of these lesions including supportive and accommodative shoegear, padding, prefabricated and custom molded accommodative orthoses, use of a pumice stone and lotions/creams daily.   #Onychomycosis with pain  -Nails palliatively debrided as below. -Educated on self-care  Procedure: Nail Debridement Rationale: Pain Type of Debridement:  manual, sharp debridement. Instrumentation: Nail nipper, rotary burr. Number of Nails: 10  Return in about 3 months (around 12/15/2022) for RFC.         Corinna Gab, DPM [facility] / [facility]

## 2022-10-08 DIAGNOSIS — I509 Heart failure, unspecified: Secondary | ICD-10-CM | POA: Diagnosis not present

## 2022-11-07 DIAGNOSIS — I509 Heart failure, unspecified: Secondary | ICD-10-CM | POA: Diagnosis not present

## 2022-12-14 ENCOUNTER — Encounter: Payer: Self-pay | Admitting: Podiatry

## 2022-12-14 ENCOUNTER — Ambulatory Visit: Payer: Medicare Other | Admitting: Podiatry

## 2022-12-14 DIAGNOSIS — B351 Tinea unguium: Secondary | ICD-10-CM | POA: Diagnosis not present

## 2022-12-14 DIAGNOSIS — M79609 Pain in unspecified limb: Secondary | ICD-10-CM | POA: Diagnosis not present

## 2022-12-14 DIAGNOSIS — I739 Peripheral vascular disease, unspecified: Secondary | ICD-10-CM | POA: Diagnosis not present

## 2022-12-14 DIAGNOSIS — L84 Corns and callosities: Secondary | ICD-10-CM

## 2022-12-14 NOTE — Progress Notes (Signed)
  Subjective:  Patient ID: Olivia [name], female    DOB: October 22, 1929,  MRN: [id number]  Chief Complaint  Patient presents with   RFC    Nails and pre- ulcerative calluses    89 y.o. female presents with the above complaint. History confirmed with patient. Patient presenting with pain related to dystrophic thickened elongated nails. Patient is unable to trim own nails related to nail dystrophy and/or mobility issues. Patient does not have a history of T2DM. Pt does have a callus lesion a the bottom of the right heel and distal hallux causing pain.  Objective:  Physical Exam: warm, good capillary refill nail exam onychomycosis of the toenails, onychomycosis of the fingernails, onycholysis, and dystrophic nails DP pulses non palpable, PT pulses non palpable, and protective sensation absent Left Foot:  Pain with palpation of nails due to elongation and dystrophic growth.  Right Foot: Pain with palpation of nails due to elongation and dystrophic growth. Hyperkeratotic lesion representing pre ulcerative lesion central plantar heel with pain on palpation. Also hyperkeratotic lesion on the right great toe.   Assessment:   1. Pre-ulcerative calluses   2. Pain due to onychomycosis of nail   3. PAD (peripheral artery disease) (HCC)         Plan:  Patient was evaluated and treated and all questions answered.  #Pre ulcerative callus plantar right heel and distal hallux of the right side All symptomatic hyperkeratoses x2 were safely debrided with a sterile #15 blade to patient's level of comfort without incident. We discussed preventative and palliative care of these lesions including supportive and accommodative shoegear, padding, prefabricated and custom molded accommodative orthoses, use of a pumice stone and lotions/creams daily.   #Onychomycosis with pain  -Nails palliatively debrided as below. -Educated on self-care  Procedure: Nail Debridement Rationale: Pain Type of Debridement:  manual, sharp debridement. Instrumentation: Nail nipper, rotary burr. Number of Nails: 10  Return in about 3 months (around 03/16/2023) for RFC.         Corinna Gab, DPM [facility] / [facility]

## 2022-12-27 ENCOUNTER — Other Ambulatory Visit: Payer: Self-pay | Admitting: Hematology and Oncology

## 2022-12-27 DIAGNOSIS — M810 Age-related osteoporosis without current pathological fracture: Secondary | ICD-10-CM

## 2022-12-29 ENCOUNTER — Inpatient Hospital Stay (HOSPITAL_BASED_OUTPATIENT_CLINIC_OR_DEPARTMENT_OTHER): Payer: Medicare Other | Admitting: Hematology and Oncology

## 2022-12-29 ENCOUNTER — Telehealth: Payer: Self-pay | Admitting: Hematology and Oncology

## 2022-12-29 ENCOUNTER — Inpatient Hospital Stay: Payer: Medicare Other | Attending: Hematology and Oncology

## 2022-12-29 ENCOUNTER — Encounter: Payer: Self-pay | Admitting: Hematology and Oncology

## 2022-12-29 ENCOUNTER — Inpatient Hospital Stay: Payer: Medicare Other

## 2022-12-29 VITALS — BP 105/64 | HR 86 | Temp 97.5°F | Resp 18 | Ht 64.0 in | Wt 119.6 lb

## 2022-12-29 DIAGNOSIS — C50411 Malignant neoplasm of upper-outer quadrant of right female breast: Secondary | ICD-10-CM | POA: Insufficient documentation

## 2022-12-29 DIAGNOSIS — Z85828 Personal history of other malignant neoplasm of skin: Secondary | ICD-10-CM | POA: Diagnosis not present

## 2022-12-29 DIAGNOSIS — Z853 Personal history of malignant neoplasm of breast: Secondary | ICD-10-CM | POA: Insufficient documentation

## 2022-12-29 DIAGNOSIS — Z79899 Other long term (current) drug therapy: Secondary | ICD-10-CM | POA: Insufficient documentation

## 2022-12-29 DIAGNOSIS — M810 Age-related osteoporosis without current pathological fracture: Secondary | ICD-10-CM

## 2022-12-29 DIAGNOSIS — Z170 Estrogen receptor positive status [ER+]: Secondary | ICD-10-CM | POA: Diagnosis not present

## 2022-12-29 LAB — CMP (CANCER CENTER ONLY)
ALT: 17 U/L (ref 0–44)
AST: 41 U/L (ref 15–41)
Albumin: 3.7 g/dL (ref 3.5–5.0)
Alkaline Phosphatase: 92 U/L (ref 38–126)
Anion gap: 12 (ref 5–15)
BUN: 40 mg/dL — ABNORMAL HIGH (ref 8–23)
CO2: 29 mmol/L (ref 22–32)
Calcium: 10.3 mg/dL (ref 8.9–10.3)
Chloride: 102 mmol/L (ref 98–111)
Creatinine: 1.8 mg/dL — ABNORMAL HIGH (ref 0.44–1.00)
GFR, Estimated: 26 mL/min — ABNORMAL LOW (ref >60)
Glucose, Bld: 115 mg/dL — ABNORMAL HIGH (ref 70–99)
Potassium: 4.9 mmol/L (ref 3.5–5.1)
Sodium: 143 mmol/L (ref 135–145)
Total Bilirubin: 0.4 mg/dL (ref <1.2)
Total Protein: 6.4 g/dL — ABNORMAL LOW (ref 6.5–8.1)

## 2022-12-29 LAB — CBC WITH DIFFERENTIAL (CANCER CENTER ONLY)
Abs Immature Granulocytes: 0.02 10*3/uL (ref 0.00–0.07)
Basophils Absolute: 0.1 10*3/uL (ref 0.0–0.1)
Basophils Relative: 1 %
Eosinophils Absolute: 0.1 10*3/uL (ref 0.0–0.5)
Eosinophils Relative: 2 %
HCT: 40.4 % (ref 36.0–46.0)
Hemoglobin: 12.7 g/dL (ref 12.0–15.0)
Immature Granulocytes: 0 %
Lymphocytes Relative: 14 %
Lymphs Abs: 1.2 10*3/uL (ref 0.7–4.0)
MCH: 30 pg (ref 26.0–34.0)
MCHC: 31.4 g/dL (ref 30.0–36.0)
MCV: 95.5 fL (ref 80.0–100.0)
Monocytes Absolute: 0.7 10*3/uL (ref 0.1–1.0)
Monocytes Relative: 8 %
Neutro Abs: 6 10*3/uL (ref 1.7–7.7)
Neutrophils Relative %: 75 %
Platelet Count: 308 10*3/uL (ref 150–400)
RBC: 4.23 MIL/uL (ref 3.87–5.11)
RDW: 14.8 % (ref 11.5–15.5)
WBC Count: 8.1 10*3/uL (ref 4.0–10.5)
nRBC: 0 % (ref 0.0–0.2)
nRBC: 0 /100{WBCs}

## 2022-12-29 MED ORDER — SODIUM CHLORIDE 0.9 % IV SOLN: Freq: Once | INTRAVENOUS | Status: DC

## 2022-12-29 MED ORDER — FULVESTRANT 250 MG/5ML IM SOSY
500.0000 mg | PREFILLED_SYRINGE | Freq: Once | INTRAMUSCULAR | Status: AC
  Administered 2022-12-29: 500 mg via INTRAMUSCULAR

## 2022-12-29 MED ORDER — DENOSUMAB 60 MG/ML ~~LOC~~ SOSY
60.0000 mg | PREFILLED_SYRINGE | Freq: Once | SUBCUTANEOUS | Status: AC
  Administered 2022-12-29: 60 mg via SUBCUTANEOUS
  Filled 2022-12-29: qty 1

## 2022-12-29 NOTE — Telephone Encounter (Signed)
12/29/22 Spoke with patient and confirmed next appts.

## 2022-12-29 NOTE — Progress Notes (Cosign Needed Addendum)
[facility]  [address] [place],  [place]  [address] [phone]  Clinic Day:  12/29/2022  Referring physician: Paulina Fusi, MD  ASSESSMENT & PLAN:   Assessment & Plan: Breast cancer [facility]) Stage IIB hormone receptor positive right breast cancer, January 2018 treated with lumpectomy and adjuvant hormonal therapy for about 3 years.      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 She underwent excisional biopsy with Dr. Lequita Halt.  HER2 was equivocal 2+, but negative by FISH.  Estrogen receptor was positive at 100% and progesterone receptor was positive at 5%.  Ki67 was 15%.  We have had several discussions regarding risks and benefits of hormonal therapy for this frail elderly woman with severe osteoporosis. We decided not to pursue adjuvant hormonal therapy. The margins were clear, but she was at high risk for recurrence due to the perineural and lymphovascular invasion.    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 The estrogen receptors are positive at 100%, PR is negative, HER 2 is negative and Ki-67 is 10%. She was placed on hormonal therapy with letrozole. Letrozole in October, 2023 at her request due to arthralgias but she should do well and will need only surveillance. She has 2 nodules highly suspicious for malignancy. I recommended she start on fulvestrant injections, these will be given 2 weeks apart as a loading dose and then every 4 weeks. I discussed the potential side effects with the patient and her daughter, most commonly body aches, hot flashes, nausea,  constipation, headache, weakness and fatigue. They understand that if no treatment is given, the lesions will continue to grow.    Age-related osteoporosis without current pathological fracture Osteoporosis.  She is now on Prolia every 6 months and is due today. Calcium and vitamin D have been discontinued due to significant renal lithiasis.  Bone density from December 2022 remains stable. Her next Prolia is due today. She is due for repeat bone density scan in December and we will see her back in 1 month to decide.   The patient understands the plans discussed today and is in agreement with them.  She knows to contact our office if she develops concerns prior to her next appointment.   40 minutes was spent in patient care.  This included time spent preparing to see the patient (e.g., review of tests), obtaining and/or reviewing separately obtained history, counseling and educating the patient/family/caregiver, ordering medications, tests, or procedures; documenting clinical information in the electronic or other health record, independently interpreting results and communicating results to the patient/family/caregiver as well as coordination of care.   Adah Perl, PA-C  [facility] [address] [place] [address] Dept: [phone] Dept Fax: [phone]   Orders Placed This Encounter  Procedures   SCHEDULING COMMUNICATION INJECTION    Schedule 1 hour injection appointment     CHIEF COMPLAINT:  CC: Recurrent hormone receptor positive breast cancer   Current Treatment:  Denosumab every 6 months  HISTORY OF PRESENT ILLNESS:   Oncology History  Breast cancer (  HCC)  02/20/2016 Cancer Staging   Staging form: Breast, AJCC 8th Edition - Clinical stage from 02/20/2016: Stage IIA (cT2(2), cN1(sn), cM0, G2, ER+, PR+, HER2-) - Signed by Dellia Beckwith, MD on 10/04/2020 Histopathologic type:  Infiltrating duct carcinoma, NOS Stage prefix: Initial diagnosis Method of lymph node assessment: Sentinel lymph node biopsy Nuclear grade: G2 Multigene prognostic tests performed: None Histologic grading system: 3 grade system Laterality: Right Tumor size (mm): 24 Multiple tumors: Yes Number of tumors: 2 Lymph-vascular invasion (LVI): LVI not present (absent)/not identified Diagnostic confirmation: Positive histology Specimen type: Excision Staged by: Managing physician Menopausal status: Postmenopausal Ki-67 (%): 10 Stage used in treatment planning: Yes National guidelines used in treatment planning: Yes Type of national guideline used in treatment planning: NCCN Staging comments: Treated with lumpectomy and 2 years anastrazole, then developed new lesion on left   01/01/2020 Initial Diagnosis   Breast cancer (HCC)   Malignant neoplasm of upper-outer quadrant of right female breast (HCC)  01/01/2020 Initial Diagnosis   Malignant neoplasm of upper-outer quadrant of right female breast (HCC)   09/16/2020 Cancer Staging   Staging form: Breast, AJCC 8th Edition - Clinical stage from 09/16/2020: Stage IA (cT1c, cN0, cM0, G2, ER+, PR+, HER2-) - Signed by Dellia Beckwith, MD on 10/04/2020 Histopathologic type: Infiltrating duct carcinoma, NOS Stage prefix: Initial diagnosis Method of lymph node assessment: Clinical Multigene prognostic tests performed: None Histologic grading system: 3 grade system Laterality: Right Tumor size (mm): 15 Lymph-vascular invasion (LVI): LVI present/identified, NOS Diagnostic confirmation: Positive histology Specimen type: Excision Staged by: Managing physician Menopausal status: Postmenopausal Ki-67 (%): 15 Stage used in treatment planning: Yes National guidelines used in treatment planning: Yes Type of national guideline used in treatment planning: NCCN     INTERVAL HISTORY:  Geanie is here today for repeat clinical assessment prior to  her Prolia injection.  She informs me that she found a spot in her right chest. She complains of chronic neck pain. She has shortness of breath with exertion.  She was on letrozole but this was discontinued due to severe arthralgias. She denies fevers or chills. Her weight has decreased 5 pounds over last 4 months .  She continues calcium and vitamin D . She will see her PCP in January. Her daughter accompanies her today and states she is eating well and feels the weight loss is due to fluid loss, as she is taking furosemide more frequently.  REVIEW OF SYSTEMS:  Review of Systems  Constitutional:  Negative for appetite change, chills, fatigue, fever and unexpected weight change.  HENT:   Negative for lump/mass, mouth sores and sore throat.   Respiratory:  Positive for shortness of breath (with exertion). Negative for cough.   Cardiovascular:  Negative for chest pain and leg swelling.  Gastrointestinal:  Negative for abdominal pain, constipation, diarrhea, nausea and vomiting.  Endocrine: Negative for hot flashes.  Genitourinary:  Negative for difficulty urinating, dysuria, frequency and hematuria.   Musculoskeletal:  Positive for gait problem (she is in a wheelchair today) and neck pain. Negative for arthralgias, back pain and myalgias.  Skin:  Negative for rash.  Neurological:  Positive for gait problem (she is in a wheelchair today). Negative for dizziness and headaches.  Hematological:  Negative for adenopathy. Does not bruise/bleed easily.  Psychiatric/Behavioral:  Negative for depression and sleep disturbance. The patient is not nervous/anxious.     VITALS:  Blood pressure 105/64, pulse 86, temperature (!) 97.5 F (36.4 C), temperature source Oral, resp. rate 18, height  5\' 4"  (1.626 m), weight 119 lb 9.6 oz (54.3 kg), SpO2 95%.  Wt Readings from Last 3 Encounters:  12/29/22 119 lb 9.6 oz (54.3 kg)  08/21/22 124 lb 1.6 oz (56.3 kg)  05/25/22 134 lb (60.8 kg)    Body mass index is 20.53  kg/m.  Performance status (ECOG): 3 - Symptomatic, >50% confined to bed  PHYSICAL EXAM:  Physical Exam Vitals and nursing note reviewed. Exam conducted with a chaperone present.  Constitutional:      General: She is not in acute distress.    Appearance: Normal appearance. She is normal weight.  HENT:     Head: Normocephalic and atraumatic.     Mouth/Throat:     Mouth: Mucous membranes are moist.     Pharynx: Oropharynx is clear. No oropharyngeal exudate or posterior oropharyngeal erythema.  Eyes:     General: No scleral icterus.    Extraocular Movements: Extraocular movements intact.     Conjunctiva/sclera: Conjunctivae normal.     Pupils: Pupils are equal, round, and reactive to light.  Cardiovascular:     Rate and Rhythm: Normal rate and regular rhythm.     Heart sounds: Normal heart sounds. No murmur heard.    No friction rub. No gallop.  Pulmonary:     Effort: Pulmonary effort is normal.     Breath sounds: Normal breath sounds. No wheezing, rhonchi or rales.  Chest:  Breasts:    Right: No inverted nipple, mass or nipple discharge.     Left: Normal. No inverted nipple, mass, nipple discharge or skin change.     Comments: There are 2 new nodules of the right lateral chest measuring  about 5mm and a 3mm nodule, concerning for recurrence.  Abdominal:     General: There is no distension.     Palpations: Abdomen is soft. There is no mass.     Tenderness: There is no abdominal tenderness.  Musculoskeletal:        General: Normal range of motion.     Cervical back: Normal range of motion and neck supple. No tenderness.     Right lower leg: No edema.     Left lower leg: No edema.  Lymphadenopathy:     Cervical: No cervical adenopathy.     Upper Body:     Right upper body: No supraclavicular or axillary adenopathy.     Left upper body: No supraclavicular or axillary adenopathy.  Skin:    General: Skin is warm and dry.     Coloration: Skin is not jaundiced.     Findings: No  rash.  Neurological:     Mental Status: She is alert and oriented to person, place, and time.     Cranial Nerves: No cranial nerve deficit.  Psychiatric:        Mood and Affect: Mood normal.        Behavior: Behavior normal.        Thought Content: Thought content normal.    LABS:      Latest Ref Rng & Units 12/29/2022    1:37 PM 05/22/2022    1:44 PM 11/04/2021   12:00 AM  CBC  WBC 4.0 - 10.5 K/uL 8.1  6.6  7.8      Hemoglobin 12.0 - 15.0 g/dL 96.0  45.4  09.8      Hematocrit 36.0 - 46.0 % 40.4  37.2  33      Platelets 150 - 400 K/uL 308  233  302  This result is from an external source.      Latest Ref Rng & Units 12/29/2022    1:37 PM 05/22/2022    1:44 PM 11/04/2021   12:00 AM  CMP  Glucose 70 - 99 mg/dL 161  85    BUN 8 - 23 mg/dL 40  24  27      Creatinine 0.44 - 1.00 mg/dL 0.96  0.45  1.3      Sodium 135 - 145 mmol/L 143  139  137      Potassium 3.5 - 5.1 mmol/L 4.9  4.5  4.7      Chloride 98 - 111 mmol/L 102  101  105      CO2 22 - 32 mmol/L 29  26  28       Calcium 8.9 - 10.3 mg/dL 40.9  8.8  9.4      Total Protein 6.5 - 8.1 g/dL 6.4  6.7    Total Bilirubin <1.2 mg/dL 0.4  0.4    Alkaline Phos 38 - 126 U/L 92  38  45      AST 15 - 41 U/L 41  30  32      ALT 0 - 44 U/L 17  15  15          This result is from an external source.     No results found for: "CEA1", "CEA" / No results found for: "CEA1", "CEA" No results found for: "PSA1" No results found for: "WJX914" No results found for: "CAN125"  No results found for: "TOTALPROTELP", "ALBUMINELP", "A1GS", "A2GS", "BETS", "BETA2SER", "GAMS", "MSPIKE", "SPEI" No results found for: "TIBC", "FERRITIN", "IRONPCTSAT" No results found for: "LDH"  STUDIES:  No results found.    HISTORY:   Past Medical History:  Diagnosis Date   Age-related osteoporosis without current pathological fracture 10/29/2019   Atherosclerotic heart disease of native coronary artery without angina pectoris 10/02/2014   Atypical  ductal hyperplasia of left breast 10/12/2018   Breast cancer (HCC)    CAD (coronary artery disease) hx of cabg   CHF (congestive heart failure), NYHA class III, acute, systolic (HCC) 03/15/2017   Chronic systolic heart failure (HCC)    Drug-induced skin rash 03/28/2016   Dysphagia as late effect of cerebrovascular accident (CVA) 04/02/2016   Dyspnea    Elevated troponin    Embolic stroke involving right middle cerebral artery (HCC) 03/25/2016   Essential (primary) hypertension 10/02/2014   History of mitral valve repair 2008   Hx of CABG 2008   Hyperlipidemia with target LDL less than 70    Hypertension    Ischemic dilated cardiomyopathy (HCC) 03/15/2017   Long-term use of aspirin therapy 07/20/2017   Malignant neoplasm of upper-outer quadrant of right female breast (HCC)    Mixed hyperlipidemia 05/28/2017   Non-rheumatic mitral regurgitation 03/15/2017   Nonrheumatic aortic valve insufficiency 03/15/2017   Old MI (myocardial infarction) 05/28/2017   Other specified postprocedural states 10/02/2014   Palpitations 05/28/2017   Protein-calorie malnutrition, severe 11/23/2019   PVD (peripheral vascular disease) (HCC)    S/P CABG (coronary artery bypass graft) 03/15/2017   Senile osteoporosis    Statin intolerance 03/15/2017    Past Surgical History:  Procedure Laterality Date   BREAST LUMPECTOMY     IR FEM POP ART ATHERECT INC PTA MOD SED  02/28/2020   IR ILIAC ART STENT INC PTA MOD SED  02/28/2020   IR US GUIDE VASC ACCESS LEFT  02/28/2020   MITRAL VALVE REPAIR (MV)/CORONARY  ARTERY BYPASS GRAFTING (CABG)  2018   RIGHT/LEFT HEART CATH AND CORONARY/GRAFT ANGIOGRAPHY N/A 11/17/2019   Procedure: RIGHT/LEFT HEART CATH AND CORONARY/GRAFT ANGIOGRAPHY;  Surgeon: Corky Crafts, MD;  Location: [facility] INVASIVE CV LAB;  Service: Cardiovascular;  Laterality: N/A;    Family History  Problem Relation Age of Onset   Hypertension Father     Social History:  reports that she quit smoking about 16 years ago.  Her smoking use included cigarettes. She has never used smokeless tobacco. She reports that she does not currently use alcohol. She reports that she does not use drugs.The patient is accompanied by her daughter today.  Allergies:  Allergies  Allergen Reactions   Iodinated Contrast Media Rash   Ticagrelor Rash   Statins Other (See Comments)    myalgias  Other reaction(s): Other (see comments)    Current Medications: Current Outpatient Medications  Medication Sig Dispense Refill   aspirin EC 81 MG EC tablet Take 1 tablet (81 mg total) by mouth daily. Swallow whole. 30 tablet 11   clopidogrel (PLAVIX) 75 MG tablet Take 75 mg by mouth daily.     clotrimazole-betamethasone (LOTRISONE) cream APPLY TO RASH ON FEET TWICE DAILY. 30 g 0   fenofibrate 160 MG tablet Take 160 mg by mouth. (Patient not taking: Reported on 12/29/2022)     fluticasone (FLONASE) 50 MCG/ACT nasal spray Place 2 sprays into both nostrils daily.     furosemide (LASIX) 20 MG tablet Take 1 tablet by mouth once daily (Patient taking differently: Take 20 mg by mouth daily. 1 tablet two times a day twice a week.) 90 tablet 3   levothyroxine (SYNTHROID) 50 MCG tablet Take 50 mcg by mouth daily before breakfast.     LORazepam (ATIVAN) 0.5 MG tablet Take 0.5 mg by mouth 3 (three) times daily as needed.     MAGNESIUM CITRATE PO Take 400 mg by mouth in the morning, at noon, and at bedtime.     metoprolol tartrate (LOPRESSOR) 25 MG tablet Take 25 mg by mouth 2 (two) times daily.     metoprolol tartrate (LOPRESSOR) 50 MG tablet Take 50 mg by mouth 3 (three) times daily. Taking twice daily if needed     mupirocin ointment (BACTROBAN) 2 % Apply 1 application topically 3 (three) times daily.     nitroGLYCERIN (NITROSTAT) 0.4 MG SL tablet Place 0.4 mg under the tongue every 5 (five) minutes as needed for chest pain.     ondansetron (ZOFRAN ODT) 4 MG disintegrating tablet Take 1 tablet (4 mg total) by mouth every 8 (eight) hours as needed  for nausea or vomiting. 20 tablet 0   pantoprazole (PROTONIX) 40 MG tablet Take by mouth.     promethazine (PHENERGAN) 12.5 MG tablet Take 12.5 mg by mouth 4 (four) times daily as needed for nausea/vomiting.     Red Yeast Rice 600 MG CAPS Take 600 mg by mouth in the morning and at bedtime.     spironolactone (ALDACTONE) 25 MG tablet Take 0.5 tablets (12.5 mg total) by mouth daily. 30 tablet 0   Terbinafine HCl (LAMISIL AT SPRAY) 1 % SOLN Spray in between toes once daily at bedtime 125 mL 0   traMADol (ULTRAM) 50 MG tablet Take 50 mg by mouth every 6 (six) hours as needed for severe pain.     No current facility-administered medications for this visit.   Facility-Administered Medications Ordered in Other Visits  Medication Dose Route Frequency Provider Last Rate Last Admin   0.9 %  sodium chloride infusion   Intravenous Once Dellia Beckwith, MD        Rulon Sera Lassiter,acting as a scribe for Adah Perl, PA-C.,have documented all relevant documentation on the behalf of Adah Perl, PA-C,as directed by  Adah Perl, PA-C while in the presence of Adah Perl, PA-C.

## 2022-12-29 NOTE — Patient Instructions (Signed)
Fulvestrant Injection What is this medication? FULVESTRANT (ful VES trant) treats breast cancer. It works by blocking the hormone estrogen in breast tissue, which prevents breast cancer cells from spreading or growing. This medicine may be used for other purposes; ask your health care provider or pharmacist if you have questions. COMMON BRAND NAME(S): FASLODEX What should I tell my care team before I take this medication? They need to know if you have any of these conditions: Bleeding disorder Liver disease Low blood cell levels, such as low white cells, red cells, and platelets An unusual or allergic reaction to fulvestrant, other medications, foods, dyes, or preservatives Pregnant or trying to get pregnant Breast-feeding How should I use this medication? This medication is injected into a muscle. It is given by your care team in a hospital or clinic setting. Talk to your care team about the use of this medication in children. Special care may be needed. Overdosage: If you think you have taken too much of this medicine contact a poison control center or emergency room at once. NOTE: This medicine is only for you. Do not share this medicine with others. What if I miss a dose? Keep appointments for follow-up doses. It is important not to miss your dose. Call your care team if you are unable to keep an appointment. What may interact with this medication? Certain medications that prevent or treat blood clots, such as warfarin, enoxaparin, dalteparin, apixaban, dabigatran, rivaroxaban This list may not describe all possible interactions. Give your health care provider a list of all the medicines, herbs, non-prescription drugs, or dietary supplements you use. Also tell them if you smoke, drink alcohol, or use illegal drugs. Some items may interact with your medicine. What should I watch for while using this medication? Your condition will be monitored carefully while you are receiving this  medication. You may need blood work while taking this medication. Talk to your care team if you may be pregnant. Serious birth defects can occur if you take this medication during pregnancy and for 1 year after the last dose. You will need a negative pregnancy test before starting this medication. Contraception is recommended while taking this medication and for 1 year after the last dose. Your care team can help you find the option that works for you. Do not breastfeed while taking this medication and for 1 year after the last dose. This medication may cause infertility. Talk to your care team if you are concerned about your fertility. What side effects may I notice from receiving this medication? Side effects that you should report to your care team as soon as possible: Allergic reactions or angioedema--skin rash, itching or hives, swelling of the face, eyes, lips, tongue, arms, or legs, trouble swallowing or breathing Pain, tingling, or numbness in the hands or feet Side effects that usually do not require medical attention (report to your care team if they continue or are bothersome): Bone, joint, or muscle pain Constipation Headache Hot flashes Nausea Pain, redness, or irritation at injection site Unusual weakness or fatigue This list may not describe all possible side effects. Call your doctor for medical advice about side effects. You may report side effects to FDA at 1-800-FDA-1088. Where should I keep my medication? This medication is given in a hospital or clinic. It will not be stored at home. NOTE: This sheet is a summary. It may not cover all possible information. If you have questions about this medicine, talk to your doctor, pharmacist, or health care provider.  2024 [employer]/Gold Standard (2021-06-03 00:00:00) Denosumab Injection (Osteoporosis) What is this medication? DENOSUMAB (den oh SUE mab) prevents and treats osteoporosis. It works by Interior and spatial designer stronger and less  likely to break (fracture). It is a monoclonal antibody. This medicine may be used for other purposes; ask your health care provider or pharmacist if you have questions. COMMON BRAND NAME(S): Prolia What should I tell my care team before I take this medication? They need to know if you have any of these conditions: Dental or gum disease Had thyroid or parathyroid (glands located in neck) surgery Having dental surgery or a tooth pulled Kidney disease Low levels of calcium in the blood On dialysis Poor nutrition Thyroid disease Trouble absorbing nutrients from your food An unusual or allergic reaction to denosumab, other medications, foods, dyes, or preservatives Pregnant or trying to get pregnant Breastfeeding How should I use this medication? This medication is injected under the skin. It is given by your care team in a hospital or clinic setting. A special MedGuide will be given to you before each treatment. Be sure to read this information carefully each time. Talk to your care team about the use of this medication in children. Special care may be needed. Overdosage: If you think you have taken too much of this medicine contact a poison control center or emergency room at once. NOTE: This medicine is only for you. Do not share this medicine with others. What if I miss a dose? Keep appointments for follow-up doses. It is important not to miss your dose. Call your care team if you are unable to keep an appointment. What may interact with this medication? Do not take this medication with any of the following: Other medications that contain denosumab This medication may also interact with the following: Medications that lower your chance of fighting infection Steroid medications, such as prednisone or cortisone This list may not describe all possible interactions. Give your health care provider a list of all the medicines, herbs, non-prescription drugs, or dietary supplements you use.  Also tell them if you smoke, drink alcohol, or use illegal drugs. Some items may interact with your medicine. What should I watch for while using this medication? Your condition will be monitored carefully while you are receiving this medication. You may need blood work done while taking this medication. This medication may increase your risk of getting an infection. Call your care team for advice if you get a fever, chills, sore throat, or other symptoms of a cold or flu. Do not treat yourself. Try to avoid being around people who are sick. Tell your dentist and dental surgeon that you are taking this medication. You should not have major dental surgery while on this medication. See your dentist to have a dental exam and fix any dental problems before starting this medication. Take good care of your teeth while on this medication. Make sure you see your dentist for regular follow-up appointments. This medication may cause low levels of calcium in your body. The risk of severe side effects is increased in people with kidney disease. Your care team may prescribe calcium and vitamin D to help prevent low calcium levels while you take this medication. It is important to take calcium and vitamin D as directed by your care team. Talk to your care team if you may be pregnant. Serious birth defects may occur if you take this medication during pregnancy and for 5 months after the last dose. You will need a negative pregnancy  test before starting this medication. Contraception is recommended while taking this medication and for 5 months after the last dose. Your care team can help you find the option that works for you. Talk to your care team before breastfeeding. Changes to your treatment plan may be needed. What side effects may I notice from receiving this medication? Side effects that you should report to your care team as soon as possible: Allergic reactions--skin rash, itching, hives, swelling of the face,  lips, tongue, or throat Infection--fever, chills, cough, sore throat, wounds that don't heal, pain or trouble when passing urine, general feeling of discomfort or being unwell Low calcium level--muscle pain or cramps, confusion, tingling, or numbness in the hands or feet Osteonecrosis of the jaw--pain, swelling, or redness in the mouth, numbness of the jaw, poor healing after dental work, unusual discharge from the mouth, visible bones in the mouth Severe bone, joint, or muscle pain Skin infection--skin redness, swelling, warmth, or pain Side effects that usually do not require medical attention (report these to your care team if they continue or are bothersome): Back pain Headache Joint pain Muscle pain Pain in the hands, arms, legs, or feet Runny or stuffy nose Sore throat This list may not describe all possible side effects. Call your doctor for medical advice about side effects. You may report side effects to FDA at 1-800-FDA-1088. Where should I keep my medication? This medication is given in a hospital or clinic. It will not be stored at home. NOTE: This sheet is a summary. It may not cover all possible information. If you have questions about this medicine, talk to your doctor, pharmacist, or health care provider.  2024 [employer]/Gold Standard (2022-02-24 00:00:00)

## 2022-12-30 ENCOUNTER — Encounter: Payer: Self-pay | Admitting: Oncology

## 2023-01-04 DIAGNOSIS — Z95818 Presence of other cardiac implants and grafts: Secondary | ICD-10-CM | POA: Diagnosis not present

## 2023-01-04 DIAGNOSIS — I083 Combined rheumatic disorders of mitral, aortic and tricuspid valves: Secondary | ICD-10-CM | POA: Diagnosis not present

## 2023-01-04 DIAGNOSIS — I272 Pulmonary hypertension, unspecified: Secondary | ICD-10-CM | POA: Diagnosis not present

## 2023-01-07 ENCOUNTER — Ambulatory Visit: Payer: Medicare Other

## 2023-01-07 DIAGNOSIS — C50911 Malignant neoplasm of unspecified site of right female breast: Secondary | ICD-10-CM | POA: Diagnosis not present

## 2023-01-07 DIAGNOSIS — C792 Secondary malignant neoplasm of skin: Secondary | ICD-10-CM | POA: Diagnosis not present

## 2023-01-12 ENCOUNTER — Ambulatory Visit: Payer: Medicare Other

## 2023-01-14 ENCOUNTER — Ambulatory Visit: Payer: Medicare Other

## 2023-01-21 ENCOUNTER — Ambulatory Visit: Payer: Medicare Other

## 2023-01-28 ENCOUNTER — Ambulatory Visit: Payer: Medicare Other

## 2023-02-04 ENCOUNTER — Inpatient Hospital Stay: Payer: Medicare Other | Admitting: Oncology

## 2023-02-04 ENCOUNTER — Other Ambulatory Visit: Payer: Self-pay | Admitting: Oncology

## 2023-02-04 ENCOUNTER — Inpatient Hospital Stay: Payer: Medicare Other

## 2023-02-04 DIAGNOSIS — R918 Other nonspecific abnormal finding of lung field: Secondary | ICD-10-CM | POA: Diagnosis not present

## 2023-02-04 DIAGNOSIS — M1990 Unspecified osteoarthritis, unspecified site: Secondary | ICD-10-CM | POA: Diagnosis not present

## 2023-02-04 DIAGNOSIS — Z8673 Personal history of transient ischemic attack (TIA), and cerebral infarction without residual deficits: Secondary | ICD-10-CM | POA: Diagnosis not present

## 2023-02-04 DIAGNOSIS — R7401 Elevation of levels of liver transaminase levels: Secondary | ICD-10-CM | POA: Diagnosis not present

## 2023-02-04 DIAGNOSIS — I509 Heart failure, unspecified: Secondary | ICD-10-CM | POA: Diagnosis not present

## 2023-02-04 DIAGNOSIS — Z91041 Radiographic dye allergy status: Secondary | ICD-10-CM | POA: Diagnosis not present

## 2023-02-04 DIAGNOSIS — E039 Hypothyroidism, unspecified: Secondary | ICD-10-CM | POA: Diagnosis not present

## 2023-02-04 DIAGNOSIS — I252 Old myocardial infarction: Secondary | ICD-10-CM | POA: Diagnosis not present

## 2023-02-04 DIAGNOSIS — J90 Pleural effusion, not elsewhere classified: Secondary | ICD-10-CM | POA: Diagnosis not present

## 2023-02-04 DIAGNOSIS — I352 Nonrheumatic aortic (valve) stenosis with insufficiency: Secondary | ICD-10-CM | POA: Diagnosis not present

## 2023-02-04 DIAGNOSIS — R0602 Shortness of breath: Secondary | ICD-10-CM | POA: Diagnosis not present

## 2023-02-04 DIAGNOSIS — Z87891 Personal history of nicotine dependence: Secondary | ICD-10-CM | POA: Diagnosis not present

## 2023-02-04 DIAGNOSIS — J918 Pleural effusion in other conditions classified elsewhere: Secondary | ICD-10-CM | POA: Diagnosis not present

## 2023-02-04 DIAGNOSIS — R9431 Abnormal electrocardiogram [ECG] [EKG]: Secondary | ICD-10-CM | POA: Diagnosis not present

## 2023-02-04 DIAGNOSIS — I5023 Acute on chronic systolic (congestive) heart failure: Secondary | ICD-10-CM | POA: Diagnosis not present

## 2023-02-04 DIAGNOSIS — Z853 Personal history of malignant neoplasm of breast: Secondary | ICD-10-CM | POA: Diagnosis not present

## 2023-02-04 DIAGNOSIS — N179 Acute kidney failure, unspecified: Secondary | ICD-10-CM | POA: Diagnosis not present

## 2023-02-04 DIAGNOSIS — Z7982 Long term (current) use of aspirin: Secondary | ICD-10-CM | POA: Diagnosis not present

## 2023-02-04 DIAGNOSIS — I2510 Atherosclerotic heart disease of native coronary artery without angina pectoris: Secondary | ICD-10-CM | POA: Diagnosis not present

## 2023-02-04 DIAGNOSIS — R222 Localized swelling, mass and lump, trunk: Secondary | ICD-10-CM | POA: Diagnosis not present

## 2023-02-04 DIAGNOSIS — I493 Ventricular premature depolarization: Secondary | ICD-10-CM | POA: Diagnosis not present

## 2023-02-04 DIAGNOSIS — I130 Hypertensive heart and chronic kidney disease with heart failure and stage 1 through stage 4 chronic kidney disease, or unspecified chronic kidney disease: Secondary | ICD-10-CM | POA: Diagnosis not present

## 2023-02-04 DIAGNOSIS — I342 Nonrheumatic mitral (valve) stenosis: Secondary | ICD-10-CM | POA: Diagnosis not present

## 2023-02-04 DIAGNOSIS — Z79899 Other long term (current) drug therapy: Secondary | ICD-10-CM | POA: Diagnosis not present

## 2023-02-04 DIAGNOSIS — R531 Weakness: Secondary | ICD-10-CM | POA: Diagnosis not present

## 2023-02-04 DIAGNOSIS — I272 Pulmonary hypertension, unspecified: Secondary | ICD-10-CM | POA: Diagnosis not present

## 2023-02-04 DIAGNOSIS — N183 Chronic kidney disease, stage 3 unspecified: Secondary | ICD-10-CM | POA: Diagnosis not present

## 2023-02-04 DIAGNOSIS — Z8711 Personal history of peptic ulcer disease: Secondary | ICD-10-CM | POA: Diagnosis not present

## 2023-02-04 DIAGNOSIS — C50919 Malignant neoplasm of unspecified site of unspecified female breast: Secondary | ICD-10-CM

## 2023-02-04 DIAGNOSIS — Z951 Presence of aortocoronary bypass graft: Secondary | ICD-10-CM | POA: Diagnosis not present

## 2023-02-04 DIAGNOSIS — I340 Nonrheumatic mitral (valve) insufficiency: Secondary | ICD-10-CM | POA: Diagnosis not present

## 2023-02-04 DIAGNOSIS — I4891 Unspecified atrial fibrillation: Secondary | ICD-10-CM | POA: Diagnosis not present

## 2023-02-04 DIAGNOSIS — I361 Nonrheumatic tricuspid (valve) insufficiency: Secondary | ICD-10-CM | POA: Diagnosis not present

## 2023-02-04 DIAGNOSIS — C384 Malignant neoplasm of pleura: Secondary | ICD-10-CM | POA: Diagnosis not present

## 2023-02-04 DIAGNOSIS — Z87442 Personal history of urinary calculi: Secondary | ICD-10-CM | POA: Diagnosis not present

## 2023-02-04 DIAGNOSIS — Z888 Allergy status to other drugs, medicaments and biological substances status: Secondary | ICD-10-CM | POA: Diagnosis not present

## 2023-02-04 DIAGNOSIS — Z7902 Long term (current) use of antithrombotics/antiplatelets: Secondary | ICD-10-CM | POA: Diagnosis not present

## 2023-02-04 NOTE — Progress Notes (Incomplete)
 [facility]  [address] [place],  [place]  [address] [phone]  Clinic Day:  02/04/2023  Referring physician: Keren Vicenta BRAVO, MD  ASSESSMENT & PLAN:  Assessment: Breast cancer [facility]) Stage IIB hormone receptor positive right breast cancer, January 2018 treated with lumpectomy and adjuvant hormonal therapy for about 3 years.      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 She underwent excisional biopsy with Dr. Joesph.  HER2 was equivocal 2+, but negative by FISH.  Estrogen receptor was positive at 100% and progesterone receptor was positive at 5%.  Ki67 was 15%.  We have had several discussions regarding risks and benefits of hormonal therapy for this frail elderly woman with severe osteoporosis. We decided not to pursue adjuvant hormonal therapy. The margins were clear, but she was at high risk for recurrence due to the perineural and lymphovascular invasion.    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 The estrogen receptors are positive at 100%, PR is negative, HER 2 is negative and Ki-67 is 10%. She was placed on hormonal therapy with letrozole . Letrozole  in October, 2023 at her request due to arthralgias but she should do well and will need only surveillance. She has 2 nodules highly suspicious for malignancy. I recommended she start on fulvestrant  injections, these will be given 2 weeks apart as a loading dose and then every 4 weeks. I discussed the potential side effects with the patient and her daughter, most commonly body aches, hot flashes, nausea, constipation,  headache, weakness and fatigue. They understand that if no treatment is given, the lesions will continue to grow.  I will contact Dr. Creasie office to see to evaluate her for further surgical excision as these lesions are small and do not feel fixed.   Age-related osteoporosis without current pathological fracture Osteoporosis.  She is now on Prolia  every 6 months and is due today. Calcium and vitamin D have been discontinued due to significant renal lithiasis.  Bone density from December 2022 remains stable. Her next Prolia  is due today. She is due for repeat bone density scan in December and we will see her back in 1 month to decide.    Plan:  The patient understands the plans discussed today and is in agreement with them.  She knows to contact our office if she develops concerns prior to her next appointment.   I provided 15 minutes of face-to-face time during this encounter and > 50% was spent counseling as documented under my assessment and plan.    Wanda VEAR Cornish, MD  [facility] - A DEPT OF [facility] [address] [place] [address] Dept: [phone] Dept Fax: [phone]   No orders of the defined types were placed in this encounter.    CHIEF COMPLAINT:  CC: Recurrent hormone receptor positive breast cancer   Current Treatment:  Denosumab  every 6 months  HISTORY OF PRESENT ILLNESS:   Oncology History  Breast cancer (HCC)  02/20/2016 Cancer Staging   Staging form: Breast, AJCC 8th Edition - Clinical stage from 02/20/2016: Stage IIA (cT2(2), cN1(sn),  cM0, G2, ER+, PR+, HER2-) - Signed by Cornelius Wanda DEL, MD on 10/04/2020 Histopathologic type: Infiltrating duct carcinoma, NOS Stage prefix: Initial diagnosis Method of lymph node assessment: Sentinel lymph node biopsy Nuclear grade: G2 Multigene prognostic tests performed: None Histologic grading system: 3 grade system Laterality: Right Tumor size  (mm): 24 Multiple tumors: Yes Number of tumors: 2 Lymph-vascular invasion (LVI): LVI not present (absent)/not identified Diagnostic confirmation: Positive histology Specimen type: Excision Staged by: Managing physician Menopausal status: Postmenopausal Ki-67 (%): 10 Stage used in treatment planning: Yes National guidelines used in treatment planning: Yes Type of national guideline used in treatment planning: NCCN Staging comments: Treated with lumpectomy and 2 years anastrazole, then developed new lesion on left   01/01/2020 Initial Diagnosis   Breast cancer (HCC)   Malignant neoplasm of upper-outer quadrant of right female breast (HCC)  01/01/2020 Initial Diagnosis   Malignant neoplasm of upper-outer quadrant of right female breast (HCC)   09/16/2020 Cancer Staging   Staging form: Breast, AJCC 8th Edition - Clinical stage from 09/16/2020: Stage IA (cT1c, cN0, cM0, G2, ER+, PR+, HER2-) - Signed by Cornelius Wanda DEL, MD on 10/04/2020 Histopathologic type: Infiltrating duct carcinoma, NOS Stage prefix: Initial diagnosis Method of lymph node assessment: Clinical Multigene prognostic tests performed: None Histologic grading system: 3 grade system Laterality: Right Tumor size (mm): 15 Lymph-vascular invasion (LVI): LVI present/identified, NOS Diagnostic confirmation: Positive histology Specimen type: Excision Staged by: Managing physician Menopausal status: Postmenopausal Ki-67 (%): 15 Stage used in treatment planning: Yes National guidelines used in treatment planning: Yes Type of national guideline used in treatment planning: NCCN     INTERVAL HISTORY:  Olivia [name] is here today for repeat clinical assessment for recurrent hormone receptor positive breast cancer. Patient states that she feels *** and ***.     She denies signs of infection such as sore throat, sinus drainage, cough, or urinary symptoms.  She denies fevers or recurrent chills. She denies pain. She denies  nausea, vomiting, chest pain, dyspnea or cough. Her appetite is *** and her weight {Weight change:10426}.   prior to her Prolia  injection.  She informs me that she found a spot in her right chest. She complains of chronic neck pain. She has shortness of breath with exertion.  She was on letrozole  but this was discontinued due to severe arthralgias. She denies fevers or chills. Her weight has decreased 5 pounds over last 4 months .  She continues calcium and vitamin D . She will see her PCP in January. Her daughter accompanies her today and states she is eating well and feels the weight loss is due to fluid loss, as she is taking furosemide  more frequently.  REVIEW OF SYSTEMS:  Review of Systems  Constitutional:  Negative for appetite change, chills, fatigue, fever and unexpected weight change.  HENT:   Negative for lump/mass, mouth sores and sore throat.   Respiratory:  Positive for shortness of breath (with exertion). Negative for cough.   Cardiovascular:  Negative for chest pain and leg swelling.  Gastrointestinal:  Negative for abdominal pain, constipation, diarrhea, nausea and vomiting.  Endocrine: Negative for hot flashes.  Genitourinary:  Negative for difficulty urinating, dysuria, frequency and hematuria.   Musculoskeletal:  Positive for gait problem (she is in a wheelchair today) and neck pain. Negative for arthralgias, back pain and myalgias.  Skin:  Negative for rash.  Neurological:  Positive for gait problem (she is in a wheelchair today). Negative for dizziness and headaches.  Hematological:  Negative for adenopathy.  Does not bruise/bleed easily.  Psychiatric/Behavioral:  Negative for depression and sleep disturbance. The patient is not nervous/anxious.     VITALS:  There were no vitals taken for this visit.  Wt Readings from Last 3 Encounters:  12/29/22 119 lb 9.6 oz (54.3 kg)  08/21/22 124 lb 1.6 oz (56.3 kg)  05/25/22 134 lb (60.8 kg)    There is no height or weight on file  to calculate BMI.  Performance status (ECOG): 3 - Symptomatic, >50% confined to bed  PHYSICAL EXAM:  Physical Exam Vitals and nursing note reviewed. Exam conducted with a chaperone present.  Constitutional:      General: She is not in acute distress.    Appearance: Normal appearance. She is normal weight.  HENT:     Head: Normocephalic and atraumatic.     Mouth/Throat:     Mouth: Mucous membranes are moist.     Pharynx: Oropharynx is clear. No oropharyngeal exudate or posterior oropharyngeal erythema.  Eyes:     General: No scleral icterus.    Extraocular Movements: Extraocular movements intact.     Conjunctiva/sclera: Conjunctivae normal.     Pupils: Pupils are equal, round, and reactive to light.  Cardiovascular:     Rate and Rhythm: Normal rate and regular rhythm.     Heart sounds: Normal heart sounds. No murmur heard.    No friction rub. No gallop.  Pulmonary:     Effort: Pulmonary effort is normal.     Breath sounds: Normal breath sounds. No wheezing, rhonchi or rales.  Chest:  Breasts:    Right: No inverted nipple, mass or nipple discharge.     Left: Normal. No inverted nipple, mass, nipple discharge or skin change.     Comments: There are 2 new nodules of the right lateral chest measuring  about 5mm and a 3mm nodule, which are mobile.  These likely represent skin recurrence.  Abdominal:     General: There is no distension.     Palpations: Abdomen is soft. There is no mass.     Tenderness: There is no abdominal tenderness.  Musculoskeletal:        General: Normal range of motion.     Cervical back: Normal range of motion and neck supple. No tenderness.     Right lower leg: No edema.     Left lower leg: No edema.  Lymphadenopathy:     Cervical: No cervical adenopathy.     Upper Body:     Right upper body: No supraclavicular or axillary adenopathy.     Left upper body: No supraclavicular or axillary adenopathy.  Skin:    General: Skin is warm and dry.      Coloration: Skin is not jaundiced.     Findings: No rash.  Neurological:     Mental Status: She is alert and oriented to person, place, and time.     Cranial Nerves: No cranial nerve deficit.  Psychiatric:        Mood and Affect: Mood normal.        Behavior: Behavior normal.        Thought Content: Thought content normal.    LABS:      Latest Ref Rng & Units 12/29/2022    1:37 PM 05/22/2022    1:44 PM 11/04/2021   12:00 AM  CBC  WBC 4.0 - 10.5 K/uL 8.1  6.6  7.8      Hemoglobin 12.0 - 15.0 g/dL 87.2  88.5  89.2  Hematocrit 36.0 - 46.0 % 40.4  37.2  33      Platelets 150 - 400 K/uL 308  233  302         This result is from an external source.      Latest Ref Rng & Units 12/29/2022    1:37 PM 05/22/2022    1:44 PM 11/04/2021   12:00 AM  CMP  Glucose 70 - 99 mg/dL 884  85    BUN 8 - 23 mg/dL 40  24  27      Creatinine 0.44 - 1.00 mg/dL 8.19  8.67  1.3      Sodium 135 - 145 mmol/L 143  139  137      Potassium 3.5 - 5.1 mmol/L 4.9  4.5  4.7      Chloride 98 - 111 mmol/L 102  101  105      CO2 22 - 32 mmol/L 29  26  28       Calcium 8.9 - 10.3 mg/dL 89.6  8.8  9.4      Total Protein 6.5 - 8.1 g/dL 6.4  6.7    Total Bilirubin <1.2 mg/dL 0.4  0.4    Alkaline Phos 38 - 126 U/L 92  38  45      AST 15 - 41 U/L 41  30  32      ALT 0 - 44 U/L 17  15  15          This result is from an external source.     No results found for: CEA1, CEA / No results found for: CEA1, CEA No results found for: PSA1 No results found for: CAN199 No results found for: CAN125  No results found for: TOTALPROTELP, ALBUMINELP, A1GS, A2GS, BETS, BETA2SER, GAMS, MSPIKE, SPEI No results found for: TIBC, FERRITIN, IRONPCTSAT No results found for: LDH  STUDIES:  No results found.    HISTORY:   Past Medical History:  Diagnosis Date   Age-related osteoporosis without current pathological fracture 10/29/2019   Atherosclerotic heart disease of native coronary  artery without angina pectoris 10/02/2014   Atypical ductal hyperplasia of left breast 10/12/2018   Breast cancer (HCC)    CAD (coronary artery disease) hx of cabg   CHF (congestive heart failure), NYHA class III, acute, systolic (HCC) 03/15/2017   Chronic systolic heart failure (HCC)    Drug-induced skin rash 03/28/2016   Dysphagia as late effect of cerebrovascular accident (CVA) 04/02/2016   Dyspnea    Elevated troponin    Embolic stroke involving right middle cerebral artery (HCC) 03/25/2016   Essential (primary) hypertension 10/02/2014   History of mitral valve repair 2008   Hx of CABG 2008   Hyperlipidemia with target LDL less than 70    Hypertension    Ischemic dilated cardiomyopathy (HCC) 03/15/2017   Long-term use of aspirin  therapy 07/20/2017   Malignant neoplasm of upper-outer quadrant of right female breast (HCC)    Mixed hyperlipidemia 05/28/2017   Non-rheumatic mitral regurgitation 03/15/2017   Nonrheumatic aortic valve insufficiency 03/15/2017   Old MI (myocardial infarction) 05/28/2017   Other specified postprocedural states 10/02/2014   Palpitations 05/28/2017   Protein-calorie malnutrition, severe 11/23/2019   PVD (peripheral vascular disease) (HCC)    S/P CABG (coronary artery bypass graft) 03/15/2017   Senile osteoporosis    Statin intolerance 03/15/2017    Past Surgical History:  Procedure Laterality Date   BREAST LUMPECTOMY     IR FEM POP ART ATHERECT INC  PTA MOD SED  02/28/2020   IR ILIAC ART STENT INC PTA MOD SED  02/28/2020   IR US  GUIDE VASC ACCESS LEFT  02/28/2020   MITRAL VALVE REPAIR (MV)/CORONARY ARTERY BYPASS GRAFTING (CABG)  2018   RIGHT/LEFT HEART CATH AND CORONARY/GRAFT ANGIOGRAPHY N/A 11/17/2019   Procedure: RIGHT/LEFT HEART CATH AND CORONARY/GRAFT ANGIOGRAPHY;  Surgeon: Dann Candyce RAMAN, MD;  Location: [facility] INVASIVE [facility];  Service: Cardiovascular;  Laterality: N/A;    Family History  Problem Relation Age of Onset   Hypertension Father     Social  History:  reports that she quit smoking about 17 years ago. Her smoking use included cigarettes. She has never used smokeless tobacco. She reports that she does not currently use alcohol. She reports that she does not use drugs.The patient is accompanied by her daughter today.  Allergies:  Allergies  Allergen Reactions   Iodinated Contrast Media Rash   Ticagrelor Rash   Statins Other (See Comments)    myalgias  Other reaction(s): Other (see comments)    Current Medications: Current Outpatient Medications  Medication Sig Dispense Refill   aspirin  EC 81 MG EC tablet Take 1 tablet (81 mg total) by mouth daily. Swallow whole. 30 tablet 11   clopidogrel  (PLAVIX ) 75 MG tablet Take 75 mg by mouth daily.     clotrimazole -betamethasone  (LOTRISONE ) cream APPLY TO RASH ON FEET TWICE DAILY. 30 g 0   fenofibrate 160 MG tablet Take 160 mg by mouth. (Patient not taking: Reported on 12/29/2022)     fluticasone (FLONASE) 50 MCG/ACT nasal spray Place 2 sprays into both nostrils daily.     furosemide  (LASIX ) 20 MG tablet Take 1 tablet by mouth once daily (Patient taking differently: Take 20 mg by mouth daily. 1 tablet two times a day twice a week.) 90 tablet 3   levothyroxine  (SYNTHROID ) 50 MCG tablet Take 50 mcg by mouth daily before breakfast.     LORazepam (ATIVAN) 0.5 MG tablet Take 0.5 mg by mouth 3 (three) times daily as needed.     MAGNESIUM CITRATE PO Take 400 mg by mouth in the morning, at noon, and at bedtime.     metoprolol  tartrate (LOPRESSOR ) 25 MG tablet Take 25 mg by mouth 2 (two) times daily.     metoprolol  tartrate (LOPRESSOR ) 50 MG tablet Take 50 mg by mouth 3 (three) times daily. Taking twice daily if needed     mupirocin ointment (BACTROBAN) 2 % Apply 1 application topically 3 (three) times daily.     nitroGLYCERIN  (NITROSTAT ) 0.4 MG SL tablet Place 0.4 mg under the tongue every 5 (five) minutes as needed for chest pain.     ondansetron  (ZOFRAN  ODT) 4 MG disintegrating tablet Take 1  tablet (4 mg total) by mouth every 8 (eight) hours as needed for nausea or vomiting. 20 tablet 0   pantoprazole  (PROTONIX ) 40 MG tablet Take by mouth.     promethazine  (PHENERGAN ) 12.5 MG tablet Take 12.5 mg by mouth 4 (four) times daily as needed for nausea/vomiting.     Red Yeast Rice 600 MG CAPS Take 600 mg by mouth in the morning and at bedtime.     spironolactone  (ALDACTONE ) 25 MG tablet Take 0.5 tablets (12.5 mg total) by mouth daily. 30 tablet 0   Terbinafine  HCl (LAMISIL  AT SPRAY) 1 % SOLN Spray in between toes once daily at bedtime 125 mL 0   traMADol  (ULTRAM ) 50 MG tablet Take 50 mg by mouth every 6 (six) hours as needed for severe  pain.     No current facility-administered medications for this visit.    I,Jasmine M Lassiter,acting as a scribe for Wanda VEAR Cornish, MD.,have documented all relevant documentation on the behalf of Wanda VEAR Cornish, MD,as directed by  Wanda VEAR Cornish, MD while in the presence of Wanda VEAR Cornish, MD.

## 2023-02-05 DIAGNOSIS — I509 Heart failure, unspecified: Secondary | ICD-10-CM | POA: Diagnosis not present

## 2023-02-06 DIAGNOSIS — I342 Nonrheumatic mitral (valve) stenosis: Secondary | ICD-10-CM | POA: Diagnosis not present

## 2023-02-06 DIAGNOSIS — I361 Nonrheumatic tricuspid (valve) insufficiency: Secondary | ICD-10-CM | POA: Diagnosis not present

## 2023-02-06 DIAGNOSIS — I352 Nonrheumatic aortic (valve) stenosis with insufficiency: Secondary | ICD-10-CM | POA: Diagnosis not present

## 2023-02-06 DIAGNOSIS — I340 Nonrheumatic mitral (valve) insufficiency: Secondary | ICD-10-CM | POA: Diagnosis not present

## 2023-02-06 DIAGNOSIS — I272 Pulmonary hypertension, unspecified: Secondary | ICD-10-CM

## 2023-02-07 DIAGNOSIS — C384 Malignant neoplasm of pleura: Secondary | ICD-10-CM | POA: Diagnosis not present

## 2023-02-10 DIAGNOSIS — N179 Acute kidney failure, unspecified: Secondary | ICD-10-CM | POA: Diagnosis not present

## 2023-02-10 DIAGNOSIS — Z79899 Other long term (current) drug therapy: Secondary | ICD-10-CM | POA: Diagnosis not present

## 2023-02-10 DIAGNOSIS — K219 Gastro-esophageal reflux disease without esophagitis: Secondary | ICD-10-CM | POA: Diagnosis not present

## 2023-02-10 DIAGNOSIS — I252 Old myocardial infarction: Secondary | ICD-10-CM | POA: Diagnosis not present

## 2023-02-10 DIAGNOSIS — I5032 Chronic diastolic (congestive) heart failure: Secondary | ICD-10-CM | POA: Diagnosis not present

## 2023-02-10 DIAGNOSIS — M1990 Unspecified osteoarthritis, unspecified site: Secondary | ICD-10-CM | POA: Diagnosis not present

## 2023-02-10 DIAGNOSIS — I739 Peripheral vascular disease, unspecified: Secondary | ICD-10-CM | POA: Diagnosis not present

## 2023-02-10 DIAGNOSIS — D631 Anemia in chronic kidney disease: Secondary | ICD-10-CM | POA: Diagnosis not present

## 2023-02-10 DIAGNOSIS — E785 Hyperlipidemia, unspecified: Secondary | ICD-10-CM | POA: Diagnosis not present

## 2023-02-10 DIAGNOSIS — R222 Localized swelling, mass and lump, trunk: Secondary | ICD-10-CM | POA: Diagnosis not present

## 2023-02-10 DIAGNOSIS — E039 Hypothyroidism, unspecified: Secondary | ICD-10-CM | POA: Diagnosis not present

## 2023-02-10 DIAGNOSIS — L309 Dermatitis, unspecified: Secondary | ICD-10-CM | POA: Diagnosis not present

## 2023-02-10 DIAGNOSIS — Z8673 Personal history of transient ischemic attack (TIA), and cerebral infarction without residual deficits: Secondary | ICD-10-CM | POA: Diagnosis not present

## 2023-02-10 DIAGNOSIS — I080 Rheumatic disorders of both mitral and aortic valves: Secondary | ICD-10-CM | POA: Diagnosis not present

## 2023-02-10 DIAGNOSIS — I480 Paroxysmal atrial fibrillation: Secondary | ICD-10-CM | POA: Diagnosis not present

## 2023-02-10 DIAGNOSIS — N183 Chronic kidney disease, stage 3 unspecified: Secondary | ICD-10-CM | POA: Diagnosis not present

## 2023-02-10 DIAGNOSIS — I130 Hypertensive heart and chronic kidney disease with heart failure and stage 1 through stage 4 chronic kidney disease, or unspecified chronic kidney disease: Secondary | ICD-10-CM | POA: Diagnosis not present

## 2023-02-10 DIAGNOSIS — Z87891 Personal history of nicotine dependence: Secondary | ICD-10-CM | POA: Diagnosis not present

## 2023-02-10 DIAGNOSIS — I2510 Atherosclerotic heart disease of native coronary artery without angina pectoris: Secondary | ICD-10-CM | POA: Diagnosis not present

## 2023-02-10 DIAGNOSIS — J9611 Chronic respiratory failure with hypoxia: Secondary | ICD-10-CM | POA: Diagnosis not present

## 2023-02-10 DIAGNOSIS — M4727 Other spondylosis with radiculopathy, lumbosacral region: Secondary | ICD-10-CM | POA: Diagnosis not present

## 2023-02-10 DIAGNOSIS — I5023 Acute on chronic systolic (congestive) heart failure: Secondary | ICD-10-CM | POA: Diagnosis not present

## 2023-02-10 DIAGNOSIS — J449 Chronic obstructive pulmonary disease, unspecified: Secondary | ICD-10-CM | POA: Diagnosis not present

## 2023-02-10 DIAGNOSIS — I6523 Occlusion and stenosis of bilateral carotid arteries: Secondary | ICD-10-CM | POA: Diagnosis not present

## 2023-02-16 DIAGNOSIS — E039 Hypothyroidism, unspecified: Secondary | ICD-10-CM | POA: Diagnosis not present

## 2023-02-16 DIAGNOSIS — I6523 Occlusion and stenosis of bilateral carotid arteries: Secondary | ICD-10-CM | POA: Diagnosis not present

## 2023-02-16 DIAGNOSIS — N183 Chronic kidney disease, stage 3 unspecified: Secondary | ICD-10-CM | POA: Diagnosis not present

## 2023-02-16 DIAGNOSIS — M4727 Other spondylosis with radiculopathy, lumbosacral region: Secondary | ICD-10-CM | POA: Diagnosis not present

## 2023-02-16 DIAGNOSIS — R222 Localized swelling, mass and lump, trunk: Secondary | ICD-10-CM | POA: Diagnosis not present

## 2023-02-16 DIAGNOSIS — I252 Old myocardial infarction: Secondary | ICD-10-CM | POA: Diagnosis not present

## 2023-02-16 DIAGNOSIS — I080 Rheumatic disorders of both mitral and aortic valves: Secondary | ICD-10-CM | POA: Diagnosis not present

## 2023-02-16 DIAGNOSIS — M1990 Unspecified osteoarthritis, unspecified site: Secondary | ICD-10-CM | POA: Diagnosis not present

## 2023-02-16 DIAGNOSIS — I5023 Acute on chronic systolic (congestive) heart failure: Secondary | ICD-10-CM | POA: Diagnosis not present

## 2023-02-16 DIAGNOSIS — E785 Hyperlipidemia, unspecified: Secondary | ICD-10-CM | POA: Diagnosis not present

## 2023-02-16 DIAGNOSIS — D631 Anemia in chronic kidney disease: Secondary | ICD-10-CM | POA: Diagnosis not present

## 2023-02-16 DIAGNOSIS — I480 Paroxysmal atrial fibrillation: Secondary | ICD-10-CM | POA: Diagnosis not present

## 2023-02-16 DIAGNOSIS — Z79899 Other long term (current) drug therapy: Secondary | ICD-10-CM | POA: Diagnosis not present

## 2023-02-16 DIAGNOSIS — N179 Acute kidney failure, unspecified: Secondary | ICD-10-CM | POA: Diagnosis not present

## 2023-02-16 DIAGNOSIS — I5032 Chronic diastolic (congestive) heart failure: Secondary | ICD-10-CM | POA: Diagnosis not present

## 2023-02-16 DIAGNOSIS — Z87891 Personal history of nicotine dependence: Secondary | ICD-10-CM | POA: Diagnosis not present

## 2023-02-16 DIAGNOSIS — J9611 Chronic respiratory failure with hypoxia: Secondary | ICD-10-CM | POA: Diagnosis not present

## 2023-02-16 DIAGNOSIS — I130 Hypertensive heart and chronic kidney disease with heart failure and stage 1 through stage 4 chronic kidney disease, or unspecified chronic kidney disease: Secondary | ICD-10-CM | POA: Diagnosis not present

## 2023-02-16 DIAGNOSIS — Z8673 Personal history of transient ischemic attack (TIA), and cerebral infarction without residual deficits: Secondary | ICD-10-CM | POA: Diagnosis not present

## 2023-02-16 DIAGNOSIS — J449 Chronic obstructive pulmonary disease, unspecified: Secondary | ICD-10-CM | POA: Diagnosis not present

## 2023-02-16 DIAGNOSIS — I739 Peripheral vascular disease, unspecified: Secondary | ICD-10-CM | POA: Diagnosis not present

## 2023-02-16 DIAGNOSIS — L309 Dermatitis, unspecified: Secondary | ICD-10-CM | POA: Diagnosis not present

## 2023-02-16 DIAGNOSIS — K219 Gastro-esophageal reflux disease without esophagitis: Secondary | ICD-10-CM | POA: Diagnosis not present

## 2023-02-16 DIAGNOSIS — I2510 Atherosclerotic heart disease of native coronary artery without angina pectoris: Secondary | ICD-10-CM | POA: Diagnosis not present

## 2023-02-18 DIAGNOSIS — I739 Peripheral vascular disease, unspecified: Secondary | ICD-10-CM | POA: Diagnosis not present

## 2023-02-18 DIAGNOSIS — N179 Acute kidney failure, unspecified: Secondary | ICD-10-CM | POA: Diagnosis not present

## 2023-02-18 DIAGNOSIS — E039 Hypothyroidism, unspecified: Secondary | ICD-10-CM | POA: Diagnosis not present

## 2023-02-18 DIAGNOSIS — Z87891 Personal history of nicotine dependence: Secondary | ICD-10-CM | POA: Diagnosis not present

## 2023-02-18 DIAGNOSIS — J9611 Chronic respiratory failure with hypoxia: Secondary | ICD-10-CM | POA: Diagnosis not present

## 2023-02-18 DIAGNOSIS — N183 Chronic kidney disease, stage 3 unspecified: Secondary | ICD-10-CM | POA: Diagnosis not present

## 2023-02-18 DIAGNOSIS — I252 Old myocardial infarction: Secondary | ICD-10-CM | POA: Diagnosis not present

## 2023-02-18 DIAGNOSIS — R222 Localized swelling, mass and lump, trunk: Secondary | ICD-10-CM | POA: Diagnosis not present

## 2023-02-18 DIAGNOSIS — I480 Paroxysmal atrial fibrillation: Secondary | ICD-10-CM | POA: Diagnosis not present

## 2023-02-18 DIAGNOSIS — K219 Gastro-esophageal reflux disease without esophagitis: Secondary | ICD-10-CM | POA: Diagnosis not present

## 2023-02-18 DIAGNOSIS — I5023 Acute on chronic systolic (congestive) heart failure: Secondary | ICD-10-CM | POA: Diagnosis not present

## 2023-02-18 DIAGNOSIS — L309 Dermatitis, unspecified: Secondary | ICD-10-CM | POA: Diagnosis not present

## 2023-02-18 DIAGNOSIS — M1990 Unspecified osteoarthritis, unspecified site: Secondary | ICD-10-CM | POA: Diagnosis not present

## 2023-02-18 DIAGNOSIS — M4727 Other spondylosis with radiculopathy, lumbosacral region: Secondary | ICD-10-CM | POA: Diagnosis not present

## 2023-02-18 DIAGNOSIS — I130 Hypertensive heart and chronic kidney disease with heart failure and stage 1 through stage 4 chronic kidney disease, or unspecified chronic kidney disease: Secondary | ICD-10-CM | POA: Diagnosis not present

## 2023-02-18 DIAGNOSIS — D631 Anemia in chronic kidney disease: Secondary | ICD-10-CM | POA: Diagnosis not present

## 2023-02-18 DIAGNOSIS — I080 Rheumatic disorders of both mitral and aortic valves: Secondary | ICD-10-CM | POA: Diagnosis not present

## 2023-02-18 DIAGNOSIS — Z8673 Personal history of transient ischemic attack (TIA), and cerebral infarction without residual deficits: Secondary | ICD-10-CM | POA: Diagnosis not present

## 2023-02-18 DIAGNOSIS — I5032 Chronic diastolic (congestive) heart failure: Secondary | ICD-10-CM | POA: Diagnosis not present

## 2023-02-18 DIAGNOSIS — Z79899 Other long term (current) drug therapy: Secondary | ICD-10-CM | POA: Diagnosis not present

## 2023-02-18 DIAGNOSIS — E785 Hyperlipidemia, unspecified: Secondary | ICD-10-CM | POA: Diagnosis not present

## 2023-02-18 DIAGNOSIS — J449 Chronic obstructive pulmonary disease, unspecified: Secondary | ICD-10-CM | POA: Diagnosis not present

## 2023-02-18 DIAGNOSIS — I6523 Occlusion and stenosis of bilateral carotid arteries: Secondary | ICD-10-CM | POA: Diagnosis not present

## 2023-02-18 DIAGNOSIS — I2510 Atherosclerotic heart disease of native coronary artery without angina pectoris: Secondary | ICD-10-CM | POA: Diagnosis not present

## 2023-02-19 DIAGNOSIS — R0689 Other abnormalities of breathing: Secondary | ICD-10-CM | POA: Diagnosis not present

## 2023-02-19 DIAGNOSIS — J9601 Acute respiratory failure with hypoxia: Secondary | ICD-10-CM | POA: Diagnosis not present

## 2023-02-19 DIAGNOSIS — Z952 Presence of prosthetic heart valve: Secondary | ICD-10-CM | POA: Diagnosis not present

## 2023-02-19 DIAGNOSIS — Z91041 Radiographic dye allergy status: Secondary | ICD-10-CM | POA: Diagnosis not present

## 2023-02-19 DIAGNOSIS — K219 Gastro-esophageal reflux disease without esophagitis: Secondary | ICD-10-CM | POA: Diagnosis not present

## 2023-02-19 DIAGNOSIS — I110 Hypertensive heart disease with heart failure: Secondary | ICD-10-CM | POA: Diagnosis not present

## 2023-02-19 DIAGNOSIS — I2510 Atherosclerotic heart disease of native coronary artery without angina pectoris: Secondary | ICD-10-CM | POA: Diagnosis not present

## 2023-02-19 DIAGNOSIS — I252 Old myocardial infarction: Secondary | ICD-10-CM | POA: Diagnosis not present

## 2023-02-19 DIAGNOSIS — J9600 Acute respiratory failure, unspecified whether with hypoxia or hypercapnia: Secondary | ICD-10-CM | POA: Diagnosis not present

## 2023-02-19 DIAGNOSIS — J90 Pleural effusion, not elsewhere classified: Secondary | ICD-10-CM | POA: Diagnosis not present

## 2023-02-19 DIAGNOSIS — R0902 Hypoxemia: Secondary | ICD-10-CM | POA: Diagnosis not present

## 2023-02-19 DIAGNOSIS — R918 Other nonspecific abnormal finding of lung field: Secondary | ICD-10-CM | POA: Diagnosis not present

## 2023-02-19 DIAGNOSIS — Z87442 Personal history of urinary calculi: Secondary | ICD-10-CM | POA: Diagnosis not present

## 2023-02-19 DIAGNOSIS — I255 Ischemic cardiomyopathy: Secondary | ICD-10-CM | POA: Diagnosis not present

## 2023-02-19 DIAGNOSIS — Z8673 Personal history of transient ischemic attack (TIA), and cerebral infarction without residual deficits: Secondary | ICD-10-CM | POA: Diagnosis not present

## 2023-02-19 DIAGNOSIS — I4891 Unspecified atrial fibrillation: Secondary | ICD-10-CM | POA: Diagnosis not present

## 2023-02-19 DIAGNOSIS — I5032 Chronic diastolic (congestive) heart failure: Secondary | ICD-10-CM | POA: Diagnosis not present

## 2023-02-19 DIAGNOSIS — Z743 Need for continuous supervision: Secondary | ICD-10-CM | POA: Diagnosis not present

## 2023-02-19 DIAGNOSIS — J440 Chronic obstructive pulmonary disease with acute lower respiratory infection: Secondary | ICD-10-CM | POA: Diagnosis not present

## 2023-02-19 DIAGNOSIS — R000 Tachycardia, unspecified: Secondary | ICD-10-CM | POA: Diagnosis not present

## 2023-02-19 DIAGNOSIS — Z9889 Other specified postprocedural states: Secondary | ICD-10-CM | POA: Diagnosis not present

## 2023-02-19 DIAGNOSIS — Z951 Presence of aortocoronary bypass graft: Secondary | ICD-10-CM | POA: Diagnosis not present

## 2023-02-19 DIAGNOSIS — M1990 Unspecified osteoarthritis, unspecified site: Secondary | ICD-10-CM | POA: Diagnosis not present

## 2023-02-19 DIAGNOSIS — R0602 Shortness of breath: Secondary | ICD-10-CM | POA: Diagnosis not present

## 2023-02-19 DIAGNOSIS — C3492 Malignant neoplasm of unspecified part of left bronchus or lung: Secondary | ICD-10-CM | POA: Diagnosis not present

## 2023-02-19 DIAGNOSIS — R9431 Abnormal electrocardiogram [ECG] [EKG]: Secondary | ICD-10-CM | POA: Diagnosis not present

## 2023-02-19 DIAGNOSIS — J910 Malignant pleural effusion: Secondary | ICD-10-CM | POA: Diagnosis not present

## 2023-02-19 DIAGNOSIS — Z853 Personal history of malignant neoplasm of breast: Secondary | ICD-10-CM | POA: Diagnosis not present

## 2023-02-19 DIAGNOSIS — Z8711 Personal history of peptic ulcer disease: Secondary | ICD-10-CM | POA: Diagnosis not present

## 2023-02-19 DIAGNOSIS — Z87891 Personal history of nicotine dependence: Secondary | ICD-10-CM | POA: Diagnosis not present

## 2023-02-19 DIAGNOSIS — E039 Hypothyroidism, unspecified: Secondary | ICD-10-CM | POA: Diagnosis not present

## 2023-02-19 DIAGNOSIS — Z955 Presence of coronary angioplasty implant and graft: Secondary | ICD-10-CM | POA: Diagnosis not present

## 2023-02-20 DIAGNOSIS — Z9889 Other specified postprocedural states: Secondary | ICD-10-CM | POA: Diagnosis not present

## 2023-02-20 DIAGNOSIS — R918 Other nonspecific abnormal finding of lung field: Secondary | ICD-10-CM | POA: Diagnosis not present

## 2023-02-21 DIAGNOSIS — J90 Pleural effusion, not elsewhere classified: Secondary | ICD-10-CM | POA: Diagnosis not present

## 2023-03-06 DEATH — deceased

## 2023-03-22 ENCOUNTER — Ambulatory Visit: Payer: Medicare Other | Admitting: Podiatry
